# Patient Record
Sex: Female | Born: 1950
Health system: Southern US, Community
[De-identification: ages and names within clinical notes are randomized; demographics above are authoritative.]

## PROBLEM LIST (undated history)

## (undated) DIAGNOSIS — M5136 Other intervertebral disc degeneration, lumbar region: Secondary | ICD-10-CM

## (undated) DIAGNOSIS — M51369 Other intervertebral disc degeneration, lumbar region without mention of lumbar back pain or lower extremity pain: Secondary | ICD-10-CM

## (undated) DIAGNOSIS — K219 Gastro-esophageal reflux disease without esophagitis: Secondary | ICD-10-CM

## (undated) DIAGNOSIS — M1990 Unspecified osteoarthritis, unspecified site: Secondary | ICD-10-CM

## (undated) DIAGNOSIS — R300 Dysuria: Secondary | ICD-10-CM

## (undated) DIAGNOSIS — M419 Scoliosis, unspecified: Secondary | ICD-10-CM

## (undated) DIAGNOSIS — M899 Disorder of bone, unspecified: Secondary | ICD-10-CM

## (undated) DIAGNOSIS — M545 Low back pain, unspecified: Secondary | ICD-10-CM

## (undated) DIAGNOSIS — I10 Essential (primary) hypertension: Secondary | ICD-10-CM

## (undated) DIAGNOSIS — K5190 Ulcerative colitis, unspecified, without complications: Secondary | ICD-10-CM

## (undated) DIAGNOSIS — M949 Disorder of cartilage, unspecified: Secondary | ICD-10-CM

## (undated) HISTORY — DX: Disorder of bone, unspecified: M89.9

## (undated) HISTORY — DX: Low back pain: M54.5

## (undated) HISTORY — DX: Low back pain, unspecified: M54.50

## (undated) HISTORY — PX: CATARACT EXTRACTION, BILATERAL: SHX1313

## (undated) HISTORY — DX: Scoliosis, unspecified: M41.9

## (undated) HISTORY — DX: Unspecified osteoarthritis, unspecified site: M19.90

## (undated) HISTORY — DX: Dysuria: R30.0

## (undated) HISTORY — DX: Ulcerative colitis, unspecified, without complications: K51.90

## (undated) HISTORY — DX: Other intervertebral disc degeneration, lumbar region without mention of lumbar back pain or lower extremity pain: M51.369

## (undated) HISTORY — DX: Gastro-esophageal reflux disease without esophagitis: K21.9

## (undated) HISTORY — PX: CHOLECYSTECTOMY: SHX55

## (undated) HISTORY — DX: Essential (primary) hypertension: I10

## (undated) HISTORY — DX: Other intervertebral disc degeneration, lumbar region: M51.36

## (undated) HISTORY — DX: Disorder of cartilage, unspecified: M94.9

---

## 2006-04-30 ENCOUNTER — Encounter: Payer: Self-pay | Admitting: Family Medicine

## 2006-05-27 ENCOUNTER — Encounter: Payer: Self-pay | Admitting: Family Medicine

## 2006-07-27 HISTORY — PX: LUMBAR DISC SURGERY: SHX700

## 2006-09-16 ENCOUNTER — Encounter: Payer: Self-pay | Admitting: Family Medicine

## 2007-09-28 ENCOUNTER — Encounter: Payer: Self-pay | Admitting: Family Medicine

## 2007-11-24 ENCOUNTER — Encounter: Payer: Self-pay | Admitting: Family Medicine

## 2008-11-15 ENCOUNTER — Encounter: Payer: Self-pay | Admitting: Family Medicine

## 2009-03-26 ENCOUNTER — Encounter: Payer: Self-pay | Admitting: Family Medicine

## 2009-07-11 ENCOUNTER — Encounter: Payer: Self-pay | Admitting: Family Medicine

## 2009-07-12 ENCOUNTER — Encounter: Payer: Self-pay | Admitting: Family Medicine

## 2009-08-06 LAB — HM MAMMOGRAPHY: HM Mammogram: NORMAL

## 2010-01-03 ENCOUNTER — Ambulatory Visit: Payer: Self-pay | Admitting: Family Medicine

## 2010-01-03 DIAGNOSIS — M949 Disorder of cartilage, unspecified: Secondary | ICD-10-CM

## 2010-01-03 DIAGNOSIS — K219 Gastro-esophageal reflux disease without esophagitis: Secondary | ICD-10-CM | POA: Insufficient documentation

## 2010-01-03 DIAGNOSIS — M1990 Unspecified osteoarthritis, unspecified site: Secondary | ICD-10-CM | POA: Insufficient documentation

## 2010-01-03 DIAGNOSIS — M899 Disorder of bone, unspecified: Secondary | ICD-10-CM | POA: Insufficient documentation

## 2010-01-03 DIAGNOSIS — G8929 Other chronic pain: Secondary | ICD-10-CM | POA: Insufficient documentation

## 2010-01-03 DIAGNOSIS — M545 Low back pain, unspecified: Secondary | ICD-10-CM | POA: Insufficient documentation

## 2010-01-03 DIAGNOSIS — K5190 Ulcerative colitis, unspecified, without complications: Secondary | ICD-10-CM | POA: Insufficient documentation

## 2010-01-03 DIAGNOSIS — I10 Essential (primary) hypertension: Secondary | ICD-10-CM | POA: Insufficient documentation

## 2010-01-08 ENCOUNTER — Telehealth: Payer: Self-pay | Admitting: Family Medicine

## 2010-04-29 ENCOUNTER — Ambulatory Visit: Payer: Self-pay | Admitting: Family Medicine

## 2010-06-27 ENCOUNTER — Encounter: Payer: Self-pay | Admitting: Family Medicine

## 2010-06-27 ENCOUNTER — Ambulatory Visit: Payer: Self-pay | Admitting: Family Medicine

## 2010-06-27 DIAGNOSIS — R300 Dysuria: Secondary | ICD-10-CM | POA: Insufficient documentation

## 2010-06-27 LAB — CONVERTED CEMR LAB
Nitrite: NEGATIVE
Protein, U semiquant: NEGATIVE
Urobilinogen, UA: 0.2

## 2010-08-26 NOTE — Assessment & Plan Note (Signed)
Summary: ULCERATIVCULITIS/JRR   Vital Signs:  Patient profile:   60 year old female Height:      66 inches Weight:      186.75 pounds BMI:     30.25 Temp:     97.6 degrees F oral Pulse rate:   68 / minute Pulse rhythm:   regular BP sitting:   110 / 72  (left arm) Cuff size:   regular  Vitals Entered By: Zenda Alpers CMA Deborra Medina) (June 27, 2010 3:43 PM)  History of Present Illness: Chief complaint ulcerative colitis and uti  60year old female pt of Dr. Hulen Shouts with history of ulcerative colitis presents with 1 week long flare of UC.  Blood, mucus and looser stools, low abdominal cramping..... in past she has treated it with mesalamine... but ran out  after taking 5 suppositories. Her symtpms have improved significantly since taking the mesalamine. Has had 2 flares in last year.  Has noted stress triggered.  Stable colonoscopy last year.. in [place].   In last 3 weeks she has had episodes of dysuria, low abd pressure, fever, no change in back pain, urinary frequency and urgency.Marland Kitchen drinks  alot of water and symptoms resolve temporarily. No w symptoms are not present.    Problems Prior to Update: 1)  Ulcerative Colitis  (ICD-556.9) 2)  Gerd  (ICD-530.81) 3)  Hypertension  (ICD-401.9) 4)  Osteopenia  (ICD-733.90) 5)  Osteoarthritis  (ICD-715.90) 6)  Back Pain, Lumbar, Chronic  (ICD-724.2)  Allergies: 1)  ! Codeine 2)  ! Erythromycin  Past History:  Past medical, surgical, family and social histories (including risk factors) reviewed, and no changes noted (except as noted below).  Past Medical History: Reviewed history from 01/03/2010 and no changes required. Osteoarthritis Osteopenia Hypertension GERD Ulcerative Colitis  Past Surgical History: Reviewed history from 01/03/2010 and no changes required. Lumbar Discectomy 2008 Caesarean section x 2  Cholecystectomy  Family History: Reviewed history from 01/03/2010 and no changes required. Dad died of MI at 22,  DM Mom died at 40 of Lung CA  Social History: Reviewed history from 01/03/2010 and no changes required. Retired Never Smoked Alcohol use-no Drug use-no Regular exercise-yes Moved here from [facility] to be closer to grandchildren. Likes to garden  Physical Exam  General:  overweight appearing female in NAD  Mouth:  MMM Lungs:  Normal respiratory effort, chest expands symmetrically. Lungs are clear to auscultation, no crackles or wheezes. Heart:  Normal rate and regular rhythm. S1 and S2 normal without gallop, murmur, click, rub or other extra sounds. Abdomen:  mild left lower quadrant pain no rebound, no guarding, NABS  no CVA tenderness   Impression & Recommendations:  Problem # 1:  ULCERATIVE COLITIS (ICD-556.9) Flare... refilled canasa.. mesalamine suppositories.  Call if symptoms not improving as expected.   Problem # 2:  DYSURIA (ICD-788.1) No clear UTI... symptoms consistent.. urine contaminated. Send urine for culture. Push fluids. Avoid bladder irritants.   Complete Medication List: 1)  Metoprolol Tartrate 25 Mg Tabs (Metoprolol tartrate) .... One half pill by mouth twice daily 2)  Meloxicam 15 Mg Tabs (Meloxicam) .... Take one table by mouth daily 3)  Zyrtec Allergy 10 Mg Tabs (Cetirizine hcl) .... Take one tablet by mouth daily 4)  Omeprazole 40 Mg Cpdr (Omeprazole) .... Take one tablet by mouth daily 5)  Calcium 1200-1000 Mg-unit Chew (Calcium carbonate-vit d-min) .... Take one tablet by mouth daily 6)  Cranberry Plus Vitamin C 140-100-3 Mg-mg-unit Caps (Cranberry-vitamin c-vitamin e) .... Take one tablet daily  1657m 7)  Fluticasone Propionate 50 Mcg/act Susp (Fluticasone propionate) .... Use as directed 8)  Dulcolax Stool Softener 100 Mg Caps (Docusate sodium) .... Take one tablet by mouth daily 9)  Canasa 1000 Mg Supp (Mesalamine) ..Marland Kitchen. 1 pr at bedtime prn  ulcerative colitis symptoms  Other Orders: UA Dipstick W/ Micro (manual) (81000) T-Culture, Urine  (([id number]  Patient Instructions: 1)  We will call you with results of urine culture. 2)   If symptoms of UC not improving with mesalamine please call for local GI referral.  Prescriptions: CANASA 1000 MG SUPP (MESALAMINE) 1 pr at bedtime prn  ulcerative colitis symptoms  #30 x 0   Entered and Authorized by:   AEliezer LoftsMD   Signed by:   AEliezer LoftsMD on 06/27/2010   Method used:   [facility]        [facility]  [address] [facility]       [address]  [address]      Ph: [phone] [phone]      Fax: [phone]  RxID:   [id number]   Orders Added: 1)  Est. Patient Level IV [[02585]2)  UA Dipstick W/ Micro (manual) [81000] 3)  T-Culture, Urine [[[id number]]   Current Allergies (reviewed today): ! CODEINE ! ERYTHROMYCIN   Laboratory Results   Urine Tests  Date/Time Received: June 27, 2010 3:51 PM  Date/Time Reported: June 27, 2010 3:51 PM   Routine Urinalysis   Color: yellow Appearance: Clear Glucose: negative   (Normal Range: Negative) Bilirubin: negative   (Normal Range: Negative) Ketone: negative   (Normal Range: Negative) Spec. Gravity: 1.015   (Normal Range: 1.003-1.035) Blood: trace-lysed   (Normal Range: Negative) pH: 5.0   (Normal Range: 5.0-8.0) Protein: negative   (Normal Range: Negative) Urobilinogen: 0.2   (Normal Range: 0-1) Nitrite: negative   (Normal Range: Negative) Leukocyte Esterace: small   (Normal Range: Negative)

## 2010-08-26 NOTE — Progress Notes (Signed)
Summary: Zostavax wait list  Phone Note Outgoing Call Call back at Home Phone [phone]   Call placed by: DeShannon Smith CMA Deborra Medina),  January 08, 2010 2:52 PM Call placed to: Patient Summary of Call: calling pt to see if she still wanted to get the Zostavax vaccine, her name was on the waiting list. Initial call taken by: Edwin Dada CMA Deborra Medina),  January 08, 2010 2:52 PM  Follow-up for Phone Call        [facility] with patient to advise that we've recently got Zostavax in and she can schedule a nurse visit to have it done. Follow-up by: Edwin Dada CMA Deborra Medina),  January 08, 2010 2:53 PM

## 2010-08-26 NOTE — Procedures (Signed)
Summary: [facility]  [facility]   Imported By: Edmonia James 01/14/2010 13:50:08  _____________________________________________________________________  External Attachment:    Type:   Image     Comment:   External Document

## 2010-08-26 NOTE — Assessment & Plan Note (Signed)
Summary: NEW PT TO ESTABH/DLO   Vital Signs:  Patient profile:   60 year old female Height:      66 inches Weight:      176.50 pounds BMI:     28.59 Temp:     97.8 degrees F oral Pulse rate:   68 / minute Pulse rhythm:   regular BP sitting:   122 / 80  (left arm) Cuff size:   regular  Vitals Entered By: Sherrian Divers CMA Deborra Medina) 2010/01/25 10:55 AM) CC: new patient   History of Present Illness: 60 yo female here to establish care.  Ulcerative colitis- distal disease (proctitis)- diagnosed in her late 18s.  Has a flare every 2-3 years, aggrevated by stress.  Abdominal cramping, bloody diarrhea.  Usually takes Asacol suppositories, does not require prednisone. Not taking anything for prophylaxis.  Last colonoscopy was normal last year. No fevers, chills or weight loss. Last flare in February.  HTN-  has been stable on Metoprolol 12. 5 mg two times a day since she started it in December.  Awaiting records.  OA- mainly in her hands, thumb.  Taking Meloxicam 15 mg daily which has really helped with pain.  GERD- quite severe, has had two EGDs in past 3 years.  Per pt, normal endoscopies.  Symptoms greatly improved with Omeprazole 40 mg daily.  Chronic low back pain- s/p laminectomy.  Sometimes has residual radiculopathy, not severe. Used to have massage therapy once monthly, looking for something locally.  Well woman- UTD on most prevention.  I would like for her to have repeat DEXA given h/o osteopenia and PPI use.  She would like for [place] to wait for records.  Preventive Screening-Counseling & Management  Alcohol-Tobacco     Smoking Status: never  Caffeine-Diet-Exercise     Does Patient Exercise: yes      Drug Use:  no.    Current Medications (verified): 1)  Metoprolol Tartrate 25 Mg Tabs (Metoprolol Tartrate) .... One Half Pill By Mouth Twice Daily 2)  Meloxicam 15 Mg Tabs (Meloxicam) .... Take One Table By Mouth Daily 3)  Zyrtec Allergy 10 Mg Tabs (Cetirizine Hcl)  .... Take One Tablet By Mouth Daily 4)  Omeprazole 40 Mg Cpdr (Omeprazole) .... Take One Tablet By Mouth Daily 5)  Calcium 1200-1000 Mg-Unit Chew (Calcium Carbonate-Vit D-Min) .... Take One Tablet By Mouth Daily 6)  Cranberry Plus Vitamin C 140-100-3 Mg-Mg-Unit Caps (Cranberry-Vitamin C-Vitamin E) .... Take One Tablet Daily 1668m 7)  Fluticasone Propionate 50 Mcg/act Susp (Fluticasone Propionate) .... Use As Directed 8)  Dulcolax Stool Softener 100 Mg Caps (Docusate Sodium) .... Take One Tablet By Mouth Daily  Allergies (verified): 1)  ! Codeine 2)  ! Erythromycin  Past History:  Family History: Last updated: 0Jul 02, 2011Dad died of MI at 793 DM Mom died at 87of Lung CA  Social History: Last updated: 02011-07-02Retired Never Smoked Alcohol use-no Drug use-no Regular exercise-yes [place] [place] [facility] [place] Likes to garden  Risk Factors: Exercise: yes (007-08-2009  Risk Factors: Smoking Status: never (02011-07-02  Past Medical History: Osteoarthritis Osteopenia Hypertension GERD Ulcerative Colitis  Past Surgical History: Lumbar Discectomy 2008 Caesarean section x 2  Cholecystectomy  Family History: Reviewed history and no changes required. Dad died of MI at 736 DM Mom died at 834of Lung CA  Social History: Reviewed history and no changes required. Retired Never Smoked Alcohol use-no Drug use-no Regular exercise-yes [place] [facility] [facility]. Likes to garden Smoking Status:  never Drug Use:  no Does Patient Exercise:  yes  Review of Systems      See HPI General:  Denies chills, fatigue, and fever. Eyes:  Denies blurring. ENT:  Denies decreased hearing and difficulty swallowing. CV:  Denies chest pain or discomfort and difficulty breathing at night. Resp:  Denies shortness of breath. GI:  Denies abdominal pain. GU:  Denies abnormal vaginal bleeding, incontinence, and urinary frequency. MS:  Complains  of joint pain and joint swelling; denies joint redness and muscle weakness. Derm:  Denies rash. Neuro:  Denies visual disturbances and weakness. Psych:  Denies anxiety and depression. Endo:  Denies cold intolerance. Heme:  Denies abnormal bruising.  Physical Exam  General:  alert, well-developed, and well-nourished.   Head:  normocephalic.   Eyes:  vision grossly intact, pupils equal, pupils round, and pupils reactive to light.   Ears:  R ear normal and L ear normal.   Nose:  no external deformity.   Mouth:  good dentition and no gingival abnormalities.   Lungs:  Normal respiratory effort, chest expands symmetrically. Lungs are clear to auscultation, no crackles or wheezes. Heart:  Normal rate and regular rhythm. S1 and S2 normal without gallop, murmur, click, rub or other extra sounds. Abdomen:  Bowel sounds positive,abdomen soft and non-tender without masses, organomegaly or hernias noted. Extremities:  No clubbing, cyanosis, edema, or deformity noted with normal full range of motion of all joints.   Neurologic:  alert & oriented X3 and cranial nerves II-XII intact.   Skin:  Intact without suspicious lesions or rashes Psych:  Cognition and judgment appear intact. Alert and cooperative with normal attention span and concentration. No apparent delusions, illusions, hallucinations   Impression & Recommendations:  Problem # 1:  GERD (ICD-530.81) Assessment Unchanged Continue Omeprazole 40 mg.  Discussed risks and benefits. Her updated medication list for this problem includes:    Omeprazole 40 Mg Cpdr (Omeprazole) .Marland Kitchen... Take one tablet by mouth daily  Problem # 2:  ULCERATIVE COLITIS (ICD-556.9) Assessment: Unchanged Currently stable.  Continue as needed meds.  Problem # 3:  HYPERTENSION (ICD-401.9) Assessment: Unchanged STable.  Continue Metoprolol at current dose. Her updated medication list for this problem includes:    Metoprolol Tartrate 25 Mg Tabs (Metoprolol tartrate)  ..... One half pill by mouth twice daily  Problem # 4:  OSTEOARTHRITIS (ICD-715.90) Assessment: Unchanged Refilled Meloxicam. Her updated medication list for this problem includes:    Meloxicam 15 Mg Tabs (Meloxicam) .Marland Kitchen... Take one table by mouth daily  Problem # 5:  BACK PAIN, LUMBAR, CHRONIC (ICD-724.2) Assessment: Unchanged Given referral for Massage Therapy weekly. Her updated medication list for this problem includes:    Meloxicam 15 Mg Tabs (Meloxicam) .Marland Kitchen... Take one table by mouth daily  Orders: Misc. Referral (Misc. Ref)  Complete Medication List: 1)  Metoprolol Tartrate 25 Mg Tabs (Metoprolol tartrate) .... One half pill by mouth twice daily 2)  Meloxicam 15 Mg Tabs (Meloxicam) .... Take one table by mouth daily 3)  Zyrtec Allergy 10 Mg Tabs (Cetirizine hcl) .... Take one tablet by mouth daily 4)  Omeprazole 40 Mg Cpdr (Omeprazole) .... Take one tablet by mouth daily 5)  Calcium 1200-1000 Mg-unit Chew (Calcium carbonate-vit d-min) .... Take one tablet by mouth daily 6)  Cranberry Plus Vitamin C 140-100-3 Mg-mg-unit Caps (Cranberry-vitamin c-vitamin e) .... Take one tablet daily 1640m 7)  Fluticasone Propionate 50 Mcg/act Susp (Fluticasone propionate) .... Use as directed 8)  Dulcolax Stool  Softener 100 Mg Caps (Docusate sodium) .... Take one tablet by mouth daily  Patient Instructions: 1)  Great to meet you, Ms. Jaquez. 2)  Please stop by to see Rosaria Ferries on your way out. 3)  We will call you when the shingles vaccine comes in. 4)  When we get your records, I will also let you know. Prescriptions: METOPROLOL TARTRATE 25 MG TABS (METOPROLOL TARTRATE) one half pill by mouth twice daily  #180 x 6   Entered and Authorized by:   Arnette Norris MD   Signed by:   Arnette Norris MD on 01/03/2010   Method used:   [facility]        [facility]  [address] [place] (retail[facility]       [place], [place]  [address]       Ph: [phone] or [phone]       Fax:  [phone]   RxID:   [id number] MELOXICAM 15 MG TABS (MELOXICAM) take one table by mouth daily  #90 x 6   Entered and Authorized by:   Arnette Norris MD   Signed by:   Arnette Norris MD on 01/03/2010   Method used:   [facility]        [facility]  [address] ([facility]ail[facility]       [place], [place]  [address]       Ph: [phone] or [phone]       Fax: [phone]   RxID:   [id number]   Current Allergies (reviewed today): ! CODEINE ! ERYTHROMYCIN  Flex Sig Next Due:  Not Indicated Colonoscopy Result Date:  08/07/2008 Colonoscopy Result:  normal Hemoccult Next Due:  Not Indicated PAP Result Date:  08/11/2008 PAP Result:  normal PAP Next Due:  2 yr Mammogram Result Date:  08/06/2009 Mammogram Result:  normal Mammogram Next Due:  1 yr

## 2010-08-26 NOTE — Procedures (Signed)
Summary: [facility]  [facility]   Imported By: Edmonia James 01/14/2010 13:48:43  _____________________________________________________________________  External Attachment:    Type:   Image     Comment:   External Document

## 2010-08-26 NOTE — Assessment & Plan Note (Signed)
Summary: FLU SHOT/CLE  Nurse Visit   Allergies: 1)  ! Codeine 2)  ! Erythromycin  Orders Added: 1)  Admin 1st Vaccine [38756] 2)  Flu Vaccine 64yr + [[43329] Flu Vaccine Consent Questions     Do you have a history of severe allergic reactions to this vaccine? no    Any prior history of allergic reactions to egg and/or gelatin? no    Do you have a sensitivity to the preservative Thimersol? no    Do you have a past history of Guillan-Barre Syndrome? no    Do you currently have an acute febrile illness? no    Have you ever had a severe reaction to latex? no    Vaccine information given and explained to patient? yes    Are you currently pregnant? no    Lot Number:[id number]   Exp Date:01/24/2011   Site Given  Left Deltoid IMu

## 2010-08-27 ENCOUNTER — Encounter: Payer: Self-pay | Admitting: Family Medicine

## 2010-08-27 ENCOUNTER — Ambulatory Visit (INDEPENDENT_AMBULATORY_CARE_PROVIDER_SITE_OTHER): Payer: BC Managed Care – PPO | Admitting: Family Medicine

## 2010-08-27 DIAGNOSIS — K5190 Ulcerative colitis, unspecified, without complications: Secondary | ICD-10-CM

## 2010-08-27 DIAGNOSIS — R300 Dysuria: Secondary | ICD-10-CM

## 2010-08-27 LAB — CONVERTED CEMR LAB
Bilirubin Urine: NEGATIVE
Glucose, Urine, Semiquant: NEGATIVE
Ketones, urine, test strip: NEGATIVE
Nitrite: NEGATIVE
Specific Gravity, Urine: <1.005
pH: 6

## 2010-08-28 ENCOUNTER — Encounter: Payer: Self-pay | Admitting: Family Medicine

## 2010-08-29 ENCOUNTER — Telehealth: Payer: Self-pay | Admitting: Family Medicine

## 2010-09-03 NOTE — Assessment & Plan Note (Signed)
Summary: colitis is acting up and may be getting a uti jrt   Vital Signs:  Patient profile:   60 year old female Height:      66 inches Weight:      185.75 pounds BMI:     30.09 Temp:     98.7 degrees F oral Pulse rate:   62 / minute Pulse rhythm:   regular BP sitting:   122 / 88  (left arm) Cuff size:   regular  Vitals Entered By: Sherrian Divers CMA Deborra Medina) (August 27, 2010 12:32 PM) CC: ? UTI, colitis   History of Present Illness: Chief complaint ulcerative colitis and ?uti  60year old  with history of ulcerative colitis presents with 1 week long flare of UC.  Blood, mucus and looser stools, low abdominal cramping, treated in past she has treated it with mesalamine and hydrocortisone suppository  but ran out  after taking a few suppositories. Her symtpms have improved significantly since taking the mesalamine but still having some cramping and bloody stools.    Has had 2 flares in past 3 months.  Has noted stress triggered. Feels that she has IBS as well.    Last flare, had a UTI as well, pan sensitive E. Coli.  Past few days, noticed some increased urinary frequency and dysuria.  No back pain, fever, or chills.    Current Medications (verified): 1)  Metoprolol Tartrate 25 Mg Tabs (Metoprolol Tartrate) .... One Half Pill By Mouth Twice Daily 2)  Meloxicam 15 Mg Tabs (Meloxicam) .... Take One Table By Mouth Daily 3)  Zyrtec Allergy 10 Mg Tabs (Cetirizine Hcl) .... Take One Tablet By Mouth Daily 4)  Omeprazole 40 Mg Cpdr (Omeprazole) .... Take One Tablet By Mouth Daily 5)  Calcium 1200-1000 Mg-Unit Chew (Calcium Carbonate-Vit D-Min) .... Take One Tablet By Mouth Daily 6)  Cranberry Plus Vitamin C 140-100-3 Mg-Mg-Unit Caps (Cranberry-Vitamin C-Vitamin E) .... Take One Tablet Daily 1655m 7)  Fluticasone Propionate 50 Mcg/act Susp (Fluticasone Propionate) .... Use As Directed 8)  Dulcolax Stool Softener 100 Mg Caps (Docusate Sodium) .... Take One Tablet By Mouth Daily 9)   Canasa 1000 Mg Supp (Mesalamine) ..Marland Kitchen. 1 Pr At Bedtime Prn  Ulcerative Colitis Symptoms 10)  Amoxicillin 500 Mg Caps (Amoxicillin) .... Take One Tablet Twice A Day For 7 Days 11)  Hydrocortisone Acetate 25 Mg Supp (Hydrocortisone Acetate) ..Marland Kitchen. 1 Supp Pr Two Times A Day As Needed. 12)  Bactrim Ds 800-160 Mg Tabs (Sulfamethoxazole-Trimethoprim) ..Marland Kitchen. 1 Tab By Mouth Two Times A Day X 3 Days  Allergies: 1)  ! Codeine 2)  ! Erythromycin  Past History:  Past Medical History: Last updated: 006-16-2011Osteoarthritis Osteopenia Hypertension GERD Ulcerative Colitis  Past Surgical History: Last updated: 006-16-2011Lumbar Discectomy 2008 Caesarean section x 2  Cholecystectomy  Family History: Last updated: 016-Jun-2011Dad died of MI at 718 DM Mom died at 81of Lung CA  Social History: Last updated: 016-Jun-2011Retired Never Smoked Alcohol use-no Drug use-no Regular exercise-yes [place] [facility] [facility] [place] [facility] [place] Likes to garden  Risk Factors: Exercise: yes (02011-06-16  Risk Factors: Smoking Status: never (006/16/2011  Review of Systems      See HPI General:  Denies chills and fever. GI:  Complains of abdominal pain, bloody stools, and diarrhea. GU:  Complains of dysuria and urinary frequency.  Physical Exam  General:  overweight appearing female in NAD  Mouth:  MMM Abdomen:  mild left lower quadrant pain no rebound, no  guarding, NABS  no CVA tenderness Psych:  Cognition and judgment appear intact. Alert and cooperative with normal attention span and concentration. No apparent delusions, illusions, hallucinations   Impression & Recommendations:  Problem # 1:  ULCERATIVE COLITIS (ICD-556.9) Assessment Deteriorated Will try refilling her hydrocortisone supporitories.  If falre persistes, will add short course of by mouth prednisone. Pt in agreement with plan.  Problem # 2:  DYSURIA (ICD-788.1) Assessment: New UA looks relatively clean, but given her  history, will treat with 3 day course of bactrim and send for culture. Her updated medication list for this problem includes:    Amoxicillin 500 Mg Caps (Amoxicillin) .Marland Kitchen... Take one tablet twice a day for 7 days    Bactrim Ds 800-160 Mg Tabs (Sulfamethoxazole-trimethoprim) .Marland Kitchen... 1 tab by mouth two times a day x 3 days  Orders: UA Dipstick w/o Micro (manual) (81002) T-Culture, Urine ([id number]) Specimen Handling (99000)  Complete Medication List: 1)  Metoprolol Tartrate 25 Mg Tabs (Metoprolol tartrate) .... One half pill by mouth twice daily 2)  Meloxicam 15 Mg Tabs (Meloxicam) .... Take one table by mouth daily 3)  Zyrtec Allergy 10 Mg Tabs (Cetirizine hcl) .... Take one tablet by mouth daily 4)  Omeprazole 40 Mg Cpdr (Omeprazole) .... Take one tablet by mouth daily 5)  Calcium 1200-1000 Mg-unit Chew (Calcium carbonate-vit d-min) .... Take one tablet by mouth daily 6)  Cranberry Plus Vitamin C 140-100-3 Mg-mg-unit Caps (Cranberry-vitamin c-vitamin e) .... Take one tablet daily 1628m 7)  Fluticasone Propionate 50 Mcg/act Susp (Fluticasone propionate) .... Use as directed 8)  Dulcolax Stool Softener 100 Mg Caps (Docusate sodium) .... Take one tablet by mouth daily 9)  Canasa 1000 Mg Supp (Mesalamine) ..Marland Kitchen. 1 pr at bedtime prn  ulcerative colitis symptoms 10)  Amoxicillin 500 Mg Caps (Amoxicillin) .... Take one tablet twice a day for 7 days 11)  Hydrocortisone Acetate 25 Mg Supp (Hydrocortisone acetate) ..Marland Kitchen. 1 supp pr two times a day as needed. 12)  Bactrim Ds 800-160 Mg Tabs (Sulfamethoxazole-trimethoprim) ..Marland Kitchen. 1 tab by mouth two times a day x 3 days Prescriptions: FLUTICASONE PROPIONATE 50 MCG/ACT SUSP (FLUTICASONE PROPIONATE) use as directed  #1 x 1   Entered and Authorized by:   TArnette NorrisMD   Signed by:   TArnette NorrisMD on 08/27/2010   Method used:   [facility]        [facility]  [address]       [address] [place]  [address]      Ph:  [phone] [phone]      Fax: [phone]  RxID:   [id number] DS 800-160 MG TABS (SULFAMETHOXAZOLE-TRIMETHOPRIM) 1 tab by mouth two times a day x 3 days  #6 x 0   Entered and Authorized by:   TArnette NorrisMD   Signed by:   TArnette NorrisMD on 08/27/2010   Method used:   [facility]        [facility]  [address] (retail)       [address]  [address]      Ph: [phone] [phone]      Fax: [phone]  RxID:   [id number] ACETATE 25 MG SUPP (HYDROCORTISONE ACETATE) 1 supp PR two times a day as needed.  #60 x 3   Entered and Authorized by:   TArnette NorrisMD  Signed by:   Arnette Norris MD on 08/27/2010   Method used:   [facility] to        [facility]  [address] [place] (retail)       [place], [place]  [address]       Ph: [phone] or [phone]       Fax: [phone]   RxID:   [id number]    Orders Added: 1)  UA Dipstick w/o Micro (manual) [81002] 2)  T-Culture, Urine [[id number]] 3)  Specimen Handling [99000] 4)  Est. Patient Level IV [44715]    Current Allergies (reviewed today): ! CODEINE ! ERYTHROMYCIN  Laboratory Results   Urine Tests  Date/Time Received: August 27, 2010 12:45 PM   Routine Urinalysis   Color: yellow Appearance: Clear Glucose: negative   (Normal Range: Negative) Bilirubin: negative   (Normal Range: Negative) Ketone: negative   (Normal Range: Negative) Spec. Gravity: <1.005   (Normal Range: 1.003-1.035) Blood: trace-intact   (Normal Range: Negative) pH: 6.0   (Normal Range: 5.0-8.0) Protein: negative   (Normal Range: Negative) Urobilinogen: 0.2   (Normal Range: 0-1) Nitrite: negative   (Normal Range: Negative) Leukocyte Esterace: negative   (Normal Range: Negative)

## 2010-09-03 NOTE — Progress Notes (Signed)
Summary: not any better  Phone Note Call from Patient Call back at Home Phone [phone]   Caller: Patient Call For: Brittney Pierce Summary of Call: Pt was seen for colitis and was told to call if not better today.  She says she is not and is asking if something can be called to [place], she says you had told her that something else would be called in. Initial call taken by: Marty Heck CMA, AAMA,  August 29, 2010 3:52 PM  Follow-up for Phone Call        yes, lets do a steroid taper.  I will send it in. Brittney Pierce  August 29, 2010 3:55 PM  Advised pt.  Follow-up by: Marty Heck CMA, AAMA,  August 29, 2010 4:09 PM    New/Updated Medications: PREDNISONE 20 MG TABS (PREDNISONE) 3 tabs by mouth x 3 days, 2 tabs by mouth x 3 days, 1 tab by mouth x 3 days and stop. dispense qs Prescriptions: PREDNISONE 20 MG TABS (PREDNISONE) 3 tabs by mouth x 3 days, 2 tabs by mouth x 3 days, 1 tab by mouth x 3 days and stop. dispense qs  #1 x 0   Entered and Authorized by:   Brittney Pierce   Signed by:   Brittney Pierce on 08/29/2010   Method used:   [facility]       [address]       [place], [place]  [address]       Ph: [phone] or [phone]       Fax: [phone]   RxID:   [id number]

## 2010-11-04 ENCOUNTER — Ambulatory Visit (INDEPENDENT_AMBULATORY_CARE_PROVIDER_SITE_OTHER): Payer: BC Managed Care – PPO | Admitting: Family Medicine

## 2010-11-04 ENCOUNTER — Encounter: Payer: Self-pay | Admitting: Family Medicine

## 2010-11-04 VITALS — BP 136/80 | HR 76 | Temp 97.7°F | Wt 190.0 lb

## 2010-11-04 DIAGNOSIS — M549 Dorsalgia, unspecified: Secondary | ICD-10-CM

## 2010-11-04 DIAGNOSIS — R300 Dysuria: Secondary | ICD-10-CM

## 2010-11-04 LAB — POCT URINALYSIS DIPSTICK
Bilirubin, UA: NEGATIVE
Glucose, UA: NEGATIVE
Ketones, UA: NEGATIVE
Protein, UA: NEGATIVE
Spec Grav, UA: 1.005

## 2010-11-04 MED ORDER — CIPROFLOXACIN HCL 500 MG PO TABS: 500.0000 mg | ORAL_TABLET | Freq: Two times a day (BID) | ORAL | Status: AC

## 2010-11-04 NOTE — Assessment & Plan Note (Signed)
UA suspicious and story consistent with UTI.  Given flank pain, cover with 54m cipro x 5 days.  Pt states has tolerated well in past. Advised to update uKoreaif not improved after this regimen. UCx sent.

## 2010-11-04 NOTE — Progress Notes (Signed)
  Subjective:    Patient ID: Brittney Pierce, female    DOB: [birth year], 60 y.o.   MRN: [id number]  HPI CC: ? UTI  Several day h/o dysuria, frequency, mild urgency.  Started about 1 1/2 wks ago.  Water helped.  Saturday and again yesterday came back.  Last night with burning lower back pain.  Has had in past, feels same.  No fevers/chills, nausea/vomiting, abd pain.  H/o ulcerative colitis flare, last treated with bactrim 08/2010.  Review of Systems Per HPI    Objective:   Physical Exam  Vitals reviewed. Constitutional: She appears well-developed and well-nourished. No distress.  HENT:  Head: Normocephalic and atraumatic.  Mouth/Throat: Oropharynx is clear and moist. No oropharyngeal exudate.  Eyes: Pupils are equal, round, and reactive to light.  Abdominal: Soft. Bowel sounds are normal. She exhibits no distension and no mass. There is no tenderness. There is CVA tenderness (mild L CVA tenderness). There is no rebound and no guarding.          Assessment & Plan:

## 2010-11-04 NOTE — Patient Instructions (Signed)
Looks like infection.  Take Cipro 573m twice daily for 5 days.  If not improved with this therapy, let uKoreaknow.  Urine culture sent today. Push fluids (cranberry juice and water) and rest.

## 2010-11-07 ENCOUNTER — Ambulatory Visit (INDEPENDENT_AMBULATORY_CARE_PROVIDER_SITE_OTHER): Payer: BC Managed Care – PPO | Admitting: Family Medicine

## 2010-11-07 VITALS — BP 122/84 | HR 72 | Temp 98.7°F | Ht 66.0 in | Wt 188.4 lb

## 2010-11-07 DIAGNOSIS — N63 Unspecified lump in unspecified breast: Secondary | ICD-10-CM

## 2010-11-07 DIAGNOSIS — N6320 Unspecified lump in the left breast, unspecified quadrant: Secondary | ICD-10-CM | POA: Insufficient documentation

## 2010-11-07 LAB — URINE CULTURE: Colony Count: 10000

## 2010-11-07 NOTE — Assessment & Plan Note (Signed)
New. Likely benign fibrous tissue. Refer for diagnostic mammogram to evaluate further.

## 2010-11-07 NOTE — Patient Instructions (Signed)
Sitz Bath A sitz bath is a warm water bath taken in the sitting position that covers only the hips and buttocks. It may be used for either healing or hygiene purposes. The water may contain medication. Sitz baths are often used to relieve pain, itching or muscle spasms. Moist heat will help you heal and relax. Follow these steps carefully: [place] in the water for 15 to 20 minutes.   After the sitz bath, blot dry the affected part first.   Take 3 to 4 sitz baths a day.  SEEK MEDICAL CARE IF: You get worse instead of better; stop the sitz baths. Document Released: 04/04/2004 Document Re-Released: 08/04/2009 [facility] Patient Information 2011 [place].  Please see Rosaria Ferries on your way out.

## 2010-11-07 NOTE — Progress Notes (Signed)
60 yo here for left breast mass. Has always had fibrous breast tissue. Overdue for screening mammogram, last done in 2009.  Noticed this mass at [facility] position a few months ago. Sometimes a little painful to palpation. No fevers, chills or redness over mass. No changes in her nipples or nipple discharge.  The PMH, PSH, Social History, Family History, Medications, and allergies have been reviewed in [facility], and have been updated if relevant.  ROS: See HPI General: Denies fever, chills, sweats. No significant weight loss. Heme: Denies enlarged lymph nodes  Physical exam:  BP 122/84  Pulse 72  Temp(Src) 98.7 F (37.1 C) (Oral)  Ht 5' 6"  (1.676 m)  Wt 188 lb 6.4 oz (85.458 kg)  BMI 30.41 kg/m2 Gen:  Alert, pleasant female, NAD. HEENT: MMM Breast exam:right breast: Breast exam: No mass, nodules, thickening, tenderness, bulging, retraction, inflamation, nipple discharge or skin changes noted.  No axillary or clavicular LA.   Left breast: small, firm freely movable lump 9 oclock position left breast.

## 2010-11-14 ENCOUNTER — Encounter: Payer: Self-pay | Admitting: *Deleted

## 2010-11-14 ENCOUNTER — Ambulatory Visit
Admission: RE | Admit: 2010-11-14 | Discharge: 2010-11-14 | Disposition: A | Discharge status: Home / Self Care | Payer: BC Managed Care – PPO | Source: Ambulatory Visit | Attending: Family Medicine | Admitting: Family Medicine

## 2010-11-14 DIAGNOSIS — N6320 Unspecified lump in the left breast, unspecified quadrant: Secondary | ICD-10-CM

## 2010-12-30 ENCOUNTER — Encounter: Payer: Self-pay | Admitting: Family Medicine

## 2010-12-30 ENCOUNTER — Ambulatory Visit (INDEPENDENT_AMBULATORY_CARE_PROVIDER_SITE_OTHER): Payer: BC Managed Care – PPO | Admitting: Family Medicine

## 2010-12-30 VITALS — BP 120/80 | HR 68 | Temp 97.9°F | Wt 189.5 lb

## 2010-12-30 DIAGNOSIS — M545 Low back pain, unspecified: Secondary | ICD-10-CM

## 2010-12-30 DIAGNOSIS — N390 Urinary tract infection, site not specified: Secondary | ICD-10-CM

## 2010-12-30 LAB — POCT URINALYSIS DIPSTICK
Ketones, UA: NEGATIVE
Leukocytes, UA: NEGATIVE
Protein, UA: NEGATIVE
Urobilinogen, UA: 0.2
pH, UA: 6

## 2010-12-30 MED ORDER — SULFAMETHOXAZOLE-TRIMETHOPRIM 800-160 MG PO TABS: 1.0000 | ORAL_TABLET | Freq: Two times a day (BID) | ORAL | Status: AC

## 2010-12-30 NOTE — Patient Instructions (Signed)
Drink plenty of fluids, start the antibiotics and we'll contact you with your lab report.  Take care.  Let us know if you don't improve.

## 2010-12-30 NOTE — Progress Notes (Signed)
Dysuria: yes, started about 10 days ago. Sx resolved with inc in po fluids.  Return in burning yesterday, and now with some B lower back pain.  This is typical for UTI sx for the patient.   duration of symptoms: as above abdominal pain: minimal in LLQ, this is frequent with colitis fevers:no back pain: as above vomiting:no other concerns: some fatigue over last few days.    Meds, vitals, and allergies reviewed.   ROS: See HPI.  Otherwise negative.    GEN: nad, alert and oriented HEENT: mucous membranes moist NECK: supple CV: rrr.  PULM: ctab, no inc wob ABD: soft, +bs, suprapubic area not tender EXT: well perfused SKIN: no acute rash BACK: no CVA pain

## 2010-12-31 ENCOUNTER — Other Ambulatory Visit: Payer: Self-pay | Admitting: *Deleted

## 2010-12-31 ENCOUNTER — Encounter: Payer: Self-pay | Admitting: Family Medicine

## 2010-12-31 DIAGNOSIS — N390 Urinary tract infection, site not specified: Secondary | ICD-10-CM | POA: Insufficient documentation

## 2010-12-31 MED ORDER — OMEPRAZOLE 40 MG PO CPDR: 40.0000 mg | DELAYED_RELEASE_CAPSULE | Freq: Every day | ORAL | Status: DC

## 2010-12-31 NOTE — Assessment & Plan Note (Signed)
Start abx, continue inc in po fluids, check cx and pt to call back or f/u if not improved.  She agrees.  Okay for outpatient fu.

## 2011-01-01 LAB — URINE CULTURE: Colony Count: NO GROWTH

## 2011-01-15 ENCOUNTER — Ambulatory Visit (INDEPENDENT_AMBULATORY_CARE_PROVIDER_SITE_OTHER): Payer: BC Managed Care – PPO | Admitting: Family Medicine

## 2011-01-15 ENCOUNTER — Encounter: Payer: Self-pay | Admitting: Family Medicine

## 2011-01-15 VITALS — BP 136/90 | HR 73 | Temp 97.8°F | Wt 189.0 lb

## 2011-01-15 DIAGNOSIS — R300 Dysuria: Secondary | ICD-10-CM

## 2011-01-15 LAB — POCT URINALYSIS DIPSTICK
Nitrite, UA: NEGATIVE
pH, UA: 5

## 2011-01-15 MED ORDER — CIPROFLOXACIN HCL 500 MG PO TABS: ORAL_TABLET | ORAL | Status: DC

## 2011-01-15 NOTE — Progress Notes (Signed)
60 yo here for dysuria.  Saw Dr. Damita Dunnings on 12/30/2010 for similar symptoms, treated with Bactrim. Urine culture showed no growth, she finished bactrim. Symptoms have actually gotten worse.  More supra pubic pressure and dysuria.  fevers:no back pain: no vomiting:no    Patient Active Problem List  Diagnoses  . HYPERTENSION  . GERD  . ULCERATIVE COLITIS  . OSTEOARTHRITIS  . BACK PAIN, LUMBAR, CHRONIC  . OSTEOPENIA  . Dysuria  . Breast mass, left  . UTI (lower urinary tract infection)   Past Medical History  Diagnosis Date  . Lumbago   . Dysuria   . Esophageal reflux   . Unspecified essential hypertension   . Osteoarthrosis, unspecified whether generalized or localized, unspecified site   . Disorder of bone and cartilage, unspecified   . Ulcerative colitis, unspecified    Past Surgical History  Procedure Date  . Lumbar disc surgery 2008  . Cesarean section     x2  . Cholecystectomy    History  Substance Use Topics  . Smoking status: Former Smoker    Quit date: 07/27/1980  . Smokeless tobacco: Never Used  . Alcohol Use: Yes     Once a month (maybe)   Family History  Problem Relation Age of Onset  . Heart attack Father 13  . Diabetes Father   . Lung cancer Mother    Allergies  Allergen Reactions  . Codeine     REACTION: stomach upset  . Erythromycin     REACTION: stomach upset   Current Outpatient Prescriptions on File Prior to Visit  Medication Sig Dispense Refill  . Calcium 1200-1000 MG-UNIT CHEW Chew 1 tablet by mouth daily.        . cetirizine (ZYRTEC) 10 MG tablet Take 10 mg by mouth daily.        . Cranberry-Vitamin C-Vitamin E (CRANBERRY CONCENTRATE) 140-100-3 MG-MG-UNIT CAPS Take 1 tablet by mouth daily.        Marland Kitchen docusate sodium (COLACE) 100 MG capsule Take 100 mg by mouth daily.        . fluticasone (FLONASE) 50 MCG/ACT nasal spray 2 sprays by Nasal route as directed.        . hydrocortisone (ANUSOL-HC) 25 MG suppository Place 25 mg rectally 2  (two) times daily as needed.        . meloxicam (MOBIC) 15 MG tablet Take 15 mg by mouth daily.        . metoprolol tartrate (LOPRESSOR) 25 MG tablet Take 12.5 mg by mouth 2 (two) times daily.        Marland Kitchen omeprazole (PRILOSEC) 40 MG capsule Take 1 capsule (40 mg total) by mouth daily.  30 capsule  6    Meds, vitals, and allergies reviewed.   ROS: See HPI.  Otherwise negative.    Physical exam: BP 136/90  Pulse 73  Temp(Src) 97.8 F (36.6 C) (Oral)  Wt 189 lb (85.73 kg)  GEN: nad, alert and oriented HEENT: mucous membranes moist NECK: supple CV: rrr.  PULM: ctab, no inc wob ABD: soft, +bs, suprapubic area not tender EXT: well perfused SKIN: no acute rash BACK: no CVA pain  1. Dysuria    Deteriorated. UA pos, will treat with cipro and send urine for culture. If culture shows no growth, refer to urology for cystoscopy/further work up. The patient indicates understanding of these issues and agrees with the plan.

## 2011-01-19 ENCOUNTER — Other Ambulatory Visit: Payer: Self-pay | Admitting: *Deleted

## 2011-01-19 MED ORDER — CEPHALEXIN 500 MG PO CAPS: ORAL_CAPSULE | ORAL | Status: DC

## 2011-02-09 ENCOUNTER — Telehealth: Payer: Self-pay | Admitting: *Deleted

## 2011-02-09 MED ORDER — CEPHALEXIN 500 MG PO CAPS: ORAL_CAPSULE | ORAL | Status: DC

## 2011-02-09 NOTE — Telephone Encounter (Signed)
Ok to refill one time, then needs to be seen. Rx sent.

## 2011-02-09 NOTE — Telephone Encounter (Signed)
Patient advised as instructed via telephone.  She has a f/u appt on 02/23/2011.

## 2011-02-09 NOTE — Telephone Encounter (Signed)
Pt was seen for UTI about 3 weeks ago.  She was given antibiotic and it cleared up but now it has come back, again has burning.  No fever.  She is asking that an antibiotic be called in, she doesn't want to come in for another office visit, says it's too expensive.

## 2011-02-10 ENCOUNTER — Other Ambulatory Visit: Payer: Self-pay | Admitting: Family Medicine

## 2011-02-10 DIAGNOSIS — Z136 Encounter for screening for cardiovascular disorders: Secondary | ICD-10-CM

## 2011-02-10 DIAGNOSIS — I10 Essential (primary) hypertension: Secondary | ICD-10-CM

## 2011-02-17 ENCOUNTER — Other Ambulatory Visit: Payer: BC Managed Care – PPO

## 2011-02-17 ENCOUNTER — Other Ambulatory Visit (INDEPENDENT_AMBULATORY_CARE_PROVIDER_SITE_OTHER): Payer: BC Managed Care – PPO | Admitting: Family Medicine

## 2011-02-17 DIAGNOSIS — Z136 Encounter for screening for cardiovascular disorders: Secondary | ICD-10-CM

## 2011-02-17 DIAGNOSIS — I10 Essential (primary) hypertension: Secondary | ICD-10-CM

## 2011-02-17 LAB — BASIC METABOLIC PANEL
CO2: 30 mEq/L (ref 19–32)
Calcium: 9.3 mg/dL (ref 8.4–10.5)
Creatinine, Ser: 0.8 mg/dL (ref 0.4–1.2)

## 2011-02-17 LAB — LIPID PANEL
Cholesterol: 178 mg/dL (ref 0–200)
LDL Cholesterol: 104 mg/dL — ABNORMAL HIGH (ref 0–99)
VLDL: 21.4 mg/dL (ref 0.0–40.0)

## 2011-02-24 ENCOUNTER — Other Ambulatory Visit (HOSPITAL_COMMUNITY)
Admission: RE | Admit: 2011-02-24 | Discharge: 2011-02-24 | Disposition: A | Discharge status: Home / Self Care | Payer: BC Managed Care – PPO | Source: Ambulatory Visit | Attending: Family Medicine | Admitting: Family Medicine

## 2011-02-24 ENCOUNTER — Ambulatory Visit (INDEPENDENT_AMBULATORY_CARE_PROVIDER_SITE_OTHER): Payer: BC Managed Care – PPO | Admitting: Family Medicine

## 2011-02-24 ENCOUNTER — Telehealth: Payer: Self-pay | Admitting: *Deleted

## 2011-02-24 ENCOUNTER — Encounter: Payer: Self-pay | Admitting: Family Medicine

## 2011-02-24 VITALS — BP 140/84 | HR 86 | Temp 97.8°F | Ht 65.75 in | Wt 187.8 lb

## 2011-02-24 DIAGNOSIS — I10 Essential (primary) hypertension: Secondary | ICD-10-CM

## 2011-02-24 DIAGNOSIS — K5190 Ulcerative colitis, unspecified, without complications: Secondary | ICD-10-CM

## 2011-02-24 DIAGNOSIS — Z0000 Encounter for general adult medical examination without abnormal findings: Secondary | ICD-10-CM

## 2011-02-24 DIAGNOSIS — D239 Other benign neoplasm of skin, unspecified: Secondary | ICD-10-CM

## 2011-02-24 DIAGNOSIS — D229 Melanocytic nevi, unspecified: Secondary | ICD-10-CM | POA: Insufficient documentation

## 2011-02-24 DIAGNOSIS — R7309 Other abnormal glucose: Secondary | ICD-10-CM

## 2011-02-24 DIAGNOSIS — Z01419 Encounter for gynecological examination (general) (routine) without abnormal findings: Secondary | ICD-10-CM | POA: Insufficient documentation

## 2011-02-24 DIAGNOSIS — Z1159 Encounter for screening for other viral diseases: Secondary | ICD-10-CM | POA: Insufficient documentation

## 2011-02-24 DIAGNOSIS — R739 Hyperglycemia, unspecified: Secondary | ICD-10-CM | POA: Insufficient documentation

## 2011-02-24 DIAGNOSIS — M899 Disorder of bone, unspecified: Secondary | ICD-10-CM

## 2011-02-24 MED ORDER — ESTROGENS, CONJUGATED 0.625 MG/GM VA CREA: TOPICAL_CREAM | Freq: Every day | VAGINAL | Status: DC

## 2011-02-24 NOTE — Patient Instructions (Signed)
Check with your insurance to see if they will cover the shingles shot. Please stop by to see Rosaria Ferries on your way out.

## 2011-02-24 NOTE — Telephone Encounter (Signed)
Patient said that she would like for estrogen cream to be called in. [facility].

## 2011-02-24 NOTE — Progress Notes (Signed)
60  yo female here for CPX.  Ulcerative colitis- distal disease (proctitis)- diagnosed in her late 2s.   Usually takes Asacol suppositories, as needed prednisone. Colonoscopy UTD. Does cause her hemorrhoids to flare.  Hyperglycemia- fasting CBG 113 this month.  Denies any increased thirst or urination. Admits to eating foods high in cards.    Well woman- UTD on most prevention.  I would like for her to have repeat DEXA given h/o osteopenia and PPI use.   Patient Active Problem List  Diagnoses  . HYPERTENSION  . GERD  . ULCERATIVE COLITIS  . OSTEOARTHRITIS  . BACK PAIN, LUMBAR, CHRONIC  . OSTEOPENIA  . Dysuria  . Breast mass, left  . UTI (lower urinary tract infection)  . Routine general medical examination at a health care facility   Past Medical History  Diagnosis Date  . Lumbago   . Dysuria   . Esophageal reflux   . Unspecified essential hypertension   . Osteoarthrosis, unspecified whether generalized or localized, unspecified site   . Disorder of bone and cartilage, unspecified   . Ulcerative colitis, unspecified    Past Surgical History  Procedure Date  . Lumbar disc surgery 2008  . Cesarean section     x2  . Cholecystectomy    History  Substance Use Topics  . Smoking status: Former Smoker    Quit date: 07/27/1980  . Smokeless tobacco: Never Used  . Alcohol Use: Yes     Once a month (maybe)   Family History  Problem Relation Age of Onset  . Heart attack Father 42  . Diabetes Father   . Lung cancer Mother    Allergies  Allergen Reactions  . Codeine     REACTION: stomach upset  . Erythromycin     REACTION: stomach upset   Current Outpatient Prescriptions on File Prior to Visit  Medication Sig Dispense Refill  . Calcium 1200-1000 MG-UNIT CHEW Chew 1 tablet by mouth daily.        . cephALEXin (KEFLEX) 500 MG capsule Take one tablet by mouth twice daily for seven days.  14 capsule  0  . cetirizine (ZYRTEC) 10 MG tablet Take 10 mg by mouth daily.         . Cranberry-Vitamin C-Vitamin E (CRANBERRY CONCENTRATE) 140-100-3 MG-MG-UNIT CAPS Take 1 tablet by mouth daily.        Marland Kitchen docusate sodium (COLACE) 100 MG capsule Take 100 mg by mouth daily.        . fluticasone (FLONASE) 50 MCG/ACT nasal spray 2 sprays by Nasal route as directed.        . hydrocortisone (ANUSOL-HC) 25 MG suppository Place 25 mg rectally 2 (two) times daily as needed.        . meloxicam (MOBIC) 15 MG tablet Take 15 mg by mouth daily.        . metoprolol tartrate (LOPRESSOR) 25 MG tablet Take 12.5 mg by mouth 2 (two) times daily.        Marland Kitchen omeprazole (PRILOSEC) 40 MG capsule Take 1 capsule (40 mg total) by mouth daily.  30 capsule  6  . The PMH, PSH, Social History, Family History, Medications, and allergies have been reviewed in [facility], and have been updated if relevant.  Review of Systems       See HPI Patient reports no  vision/ hearing changes,anorexia, weight change, fever ,adenopathy, persistant / recurrent hoarseness, swallowing issues, chest pain, edema,persistant / recurrent cough, hemoptysis, dyspnea(rest, exertional, paroxysmal nocturnal), gastrointestinal  bleeding (melena, rectal  bleeding), abdominal pain, excessive heart burn, GU symptoms(dysuria, hematuria, pyuria, voiding/incontinence  Issues) syncope, focal weakness, severe memory loss, concerning skin lesions, depression, anxiety, abnormal bruising/bleeding, major joint swelling, breast masses or abnormal vaginal bleeding.     Physical Exam BP 140/84  Pulse 86  Temp(Src) 97.8 F (36.6 C) (Oral)  Ht 5' 5.75" (1.67 m)  Wt 187 lb 12 oz (85.163 kg)  BMI 30.53 kg/m2  General:  Well-developed,well-nourished,in no acute distress; alert,appropriate and cooperative throughout examination Head:  normocephalic and atraumatic.   Eyes:  vision grossly intact, pupils equal, pupils round, and pupils reactive to light.   Ears:  R ear normal and L ear normal.   Nose:  no external deformity.   Mouth:  good dentition.   Neck:   No deformities, masses, or tenderness noted. Breasts:  No mass, nodules, thickening, tenderness, bulging, retraction, inflamation, nipple discharge or skin changes noted.   Lungs:  Normal respiratory effort, chest expands symmetrically. Lungs are clear to auscultation, no crackles or wheezes. Heart:  Normal rate and regular rhythm. S1 and S2 normal without gallop, murmur, click, rub or other extra sounds. Abdomen:  Bowel sounds positive,abdomen soft and non-tender without masses, organomegaly or hernias noted. Rectal:  no external abnormalities.   Genitalia:  Pelvic Exam:        External: normal female genitalia without lesions or masses        Vagina: normal without lesions or masses        Cervix: normal without lesions or masses        Adnexa: normal bimanual exam without masses or fullness        Uterus: normal by palpation        Pap smear: performed  Skin:  Multiple atypical nevi Assessment and Plan: 1. Routine general medical examination at a health care facility   Reviewed preventive care protocols, scheduled due services, and updated immunizations Discussed nutrition, exercise, diet, and healthy lifestyle. Discussed DEXA, shingles, Td. She wants to call her insurance company first.    2 Atypical nevi  Ambulatory referral to Dermatology  3 Hyperglycemia    Discussed eat right diet.  She is afraid to add Metformin given her h/o IBD.

## 2011-02-26 ENCOUNTER — Encounter: Payer: Self-pay | Admitting: *Deleted

## 2011-03-04 ENCOUNTER — Other Ambulatory Visit: Payer: Self-pay | Admitting: *Deleted

## 2011-03-04 MED ORDER — HYDROCORTISONE ACETATE 25 MG RE SUPP: 25.0000 mg | Freq: Two times a day (BID) | RECTAL | Status: DC | PRN

## 2011-03-11 ENCOUNTER — Ambulatory Visit (INDEPENDENT_AMBULATORY_CARE_PROVIDER_SITE_OTHER): Payer: BC Managed Care – PPO | Admitting: Family Medicine

## 2011-03-11 ENCOUNTER — Encounter: Payer: Self-pay | Admitting: Family Medicine

## 2011-03-11 VITALS — BP 130/84 | HR 80 | Temp 97.9°F | Wt 189.0 lb

## 2011-03-11 DIAGNOSIS — K5190 Ulcerative colitis, unspecified, without complications: Secondary | ICD-10-CM

## 2011-03-11 MED ORDER — PREDNISONE 20 MG PO TABS: ORAL_TABLET | ORAL | Status: DC

## 2011-03-11 NOTE — Progress Notes (Signed)
59year old  with history of ulcerative colitis presents with 1 week long flare of UC.  Blood, mucus and looser stools, low abdominal cramping, treated in past she has treated it with mesalamine and hydrocortisone suppository and or prednisone.  She stopped taking mesalamine because had fewer flares until recently. Taking abx for UTIs often a trigger for her.  Had colonoscopy a couple of years ago in [facility]. Does not have gastroenterologist here.  No fevers or chills. Eating ok.   Patient Active Problem List  Diagnoses  . HYPERTENSION  . GERD  . ULCERATIVE COLITIS  . OSTEOARTHRITIS  . BACK PAIN, LUMBAR, CHRONIC  . OSTEOPENIA  . Dysuria  . Breast mass, left  . UTI (lower urinary tract infection)  . Routine general medical examination at a health care facility  . Atypical nevi  . Hyperglycemia   Past Medical History  Diagnosis Date  . Lumbago   . Dysuria   . Esophageal reflux   . Unspecified essential hypertension   . Osteoarthrosis, unspecified whether generalized or localized, unspecified site   . Disorder of bone and cartilage, unspecified   . Ulcerative colitis, unspecified    Past Surgical History  Procedure Date  . Lumbar disc surgery 2008  . Cesarean section     x2  . Cholecystectomy    History  Substance Use Topics  . Smoking status: Former Smoker    Quit date: 07/27/1980  . Smokeless tobacco: Never Used  . Alcohol Use: Yes     Once a month (maybe)   Family History  Problem Relation Age of Onset  . Heart attack Father 58  . Diabetes Father   . Lung cancer Mother    Allergies  Allergen Reactions  . Codeine     REACTION: stomach upset  . Erythromycin     REACTION: stomach upset   Current Outpatient Prescriptions on File Prior to Visit  Medication Sig Dispense Refill  . Calcium 1200-1000 MG-UNIT CHEW Chew 1 tablet by mouth daily.        . cephALEXin (KEFLEX) 500 MG capsule Take one tablet by mouth twice daily for seven days.  14 capsule  0  .  cetirizine (ZYRTEC) 10 MG tablet Take 10 mg by mouth daily.        Marland Kitchen conjugated estrogens (PREMARIN) vaginal cream Place vaginally daily.  42.5 g  12  . Cranberry-Vitamin C-Vitamin E (CRANBERRY CONCENTRATE) 140-100-3 MG-MG-UNIT CAPS Take 1 tablet by mouth daily.        Marland Kitchen docusate sodium (COLACE) 100 MG capsule Take 100 mg by mouth daily.        . fluticasone (FLONASE) 50 MCG/ACT nasal spray 2 sprays by Nasal route as directed.        . hydrocortisone (ANUSOL-HC) 25 MG suppository Place 1 suppository (25 mg total) rectally 2 (two) times daily as needed.  12 suppository  3  . meloxicam (MOBIC) 15 MG tablet Take 15 mg by mouth daily.        . metoprolol tartrate (LOPRESSOR) 25 MG tablet Take 12.5 mg by mouth 2 (two) times daily.        Marland Kitchen omeprazole (PRILOSEC) 40 MG capsule Take 1 capsule (40 mg total) by mouth daily.  30 capsule  6   The PMH, PSH, Social History, Family History, Medications, and allergies have been reviewed in [facility], and have been updated if relevant.    Review of Systems       See HPI General:  Denies chills and fever.  GI:  Complains of abdominal pain, bloody stools, and diarrhea.   Physical Exam BP 130/84  Pulse 80  Temp(Src) 97.9 F (36.6 C) (Oral)  Wt 189 lb (85.73 kg)  General:  overweight appearing female in NAD  Mouth:  MMM Abdomen:  mild left lower quadrant pain no rebound, no guarding, NABS Psych:  Cognition and judgment appear intact. Alert and cooperative with normal attention span and concentration. No apparent delusions, illusions, hallucinations  Assessment and Plan: 1. ULCERATIVE COLITIS    Deteriorated. Advised referral to GI and restarting mesalamine.  Pt would like to hold off at this time on both. Will give prednisone for this flare but will urge GI referral and mesalamine if another flare occurs. The patient indicates understanding of these issues and agrees with the plan.

## 2011-03-19 ENCOUNTER — Other Ambulatory Visit: Payer: Self-pay | Admitting: *Deleted

## 2011-03-19 MED ORDER — FLUTICASONE PROPIONATE 50 MCG/ACT NA SUSP: 2.0000 | NASAL | Status: DC

## 2011-03-23 ENCOUNTER — Encounter: Payer: Self-pay | Admitting: Family Medicine

## 2011-03-23 ENCOUNTER — Ambulatory Visit (INDEPENDENT_AMBULATORY_CARE_PROVIDER_SITE_OTHER): Payer: BC Managed Care – PPO | Admitting: Family Medicine

## 2011-03-23 VITALS — BP 122/80 | HR 80 | Temp 98.7°F | Wt 186.8 lb

## 2011-03-23 DIAGNOSIS — J329 Chronic sinusitis, unspecified: Secondary | ICD-10-CM

## 2011-03-23 MED ORDER — SULFAMETHOXAZOLE-TRIMETHOPRIM 800-160 MG PO TABS: 1.0000 | ORAL_TABLET | Freq: Two times a day (BID) | ORAL | Status: AC

## 2011-03-23 NOTE — Progress Notes (Signed)
SUBJECTIVE:  Brittney Pierce is a 60 y.o. female who complains of coryza, sore throat, swollen glands, nasal blockage, myalgias and headache for 3 days. She denies a history of chest pain, nausea, shortness of breath and vomiting and denies a history of asthma. Patient denies smoke cigarettes.   Patient Active Problem List  Diagnoses  . HYPERTENSION  . GERD  . ULCERATIVE COLITIS  . OSTEOARTHRITIS  . BACK PAIN, LUMBAR, CHRONIC  . OSTEOPENIA  . Dysuria  . Breast mass, left  . UTI (lower urinary tract infection)  . Routine general medical examination at a health care facility  . Atypical nevi  . Hyperglycemia   Past Medical History  Diagnosis Date  . Lumbago   . Dysuria   . Esophageal reflux   . Unspecified essential hypertension   . Osteoarthrosis, unspecified whether generalized or localized, unspecified site   . Disorder of bone and cartilage, unspecified   . Ulcerative colitis, unspecified    Past Surgical History  Procedure Date  . Lumbar disc surgery 2008  . Cesarean section     x2  . Cholecystectomy    History  Substance Use Topics  . Smoking status: Former Smoker    Quit date: 07/27/1980  . Smokeless tobacco: Never Used  . Alcohol Use: Yes     Once a month (maybe)   Family History  Problem Relation Age of Onset  . Heart attack Father 68  . Diabetes Father   . Lung cancer Mother    Allergies  Allergen Reactions  . Keflex   . Codeine     REACTION: stomach upset  . Erythromycin     REACTION: stomach upset   Current Outpatient Prescriptions on File Prior to Visit  Medication Sig Dispense Refill  . Calcium 1200-1000 MG-UNIT CHEW Chew 1 tablet by mouth daily.        . cetirizine (ZYRTEC) 10 MG tablet Take 10 mg by mouth daily.        Marland Kitchen conjugated estrogens (PREMARIN) vaginal cream Place vaginally daily.  42.5 g  12  . Cranberry-Vitamin C-Vitamin E (CRANBERRY CONCENTRATE) 140-100-3 MG-MG-UNIT CAPS Take 1 tablet by mouth daily.        Marland Kitchen docusate sodium  (COLACE) 100 MG capsule Take 100 mg by mouth daily.        . fluticasone (FLONASE) 50 MCG/ACT nasal spray Place 2 sprays into the nose as directed.  16 g  3  . hydrocortisone (ANUSOL-HC) 25 MG suppository Place 1 suppository (25 mg total) rectally 2 (two) times daily as needed.  12 suppository  3  . meloxicam (MOBIC) 15 MG tablet Take 15 mg by mouth daily.        . metoprolol tartrate (LOPRESSOR) 25 MG tablet Take 12.5 mg by mouth 2 (two) times daily.        Marland Kitchen omeprazole (PRILOSEC) 40 MG capsule Take 1 capsule (40 mg total) by mouth daily.  30 capsule  6   The PMH, PSH, Social History, Family History, Medications, and allergies have been reviewed in [facility], and have been updated if relevant.  OBJECTIVE: BP 122/80  Pulse 80  Temp(Src) 98.7 F (37.1 C) (Oral)  Wt 186 lb 12 oz (84.709 kg)  She appears well, vital signs are as noted. Ears normal.  Throat and pharynx normal.  Neck supple. No adenopathy in the neck. Nose is congested. Sinuses non tender. The chest is clear, without wheezes or rales.  ASSESSMENT:  sinusitis  PLAN: Symptomatic therapy suggested: push fluids, rest  and return office visit prn if symptoms persist or worsen. Pt is leaving town, will go ahead and send in rx for Bactrim. Call or return to clinic prn if these symptoms worsen or fail to improve as anticipated.

## 2011-04-02 ENCOUNTER — Other Ambulatory Visit: Payer: Self-pay

## 2011-04-02 MED ORDER — MELOXICAM 15 MG PO TABS: 15.0000 mg | ORAL_TABLET | Freq: Every day | ORAL | Status: DC

## 2011-04-02 NOTE — Telephone Encounter (Signed)
rx sent, further refills per PMD.  Thanks.

## 2011-04-02 NOTE — Telephone Encounter (Signed)
[facility] faxed refill request for Meloxicam 75m. Since Dr ADeborra Medinais out of office please advise.

## 2011-04-07 ENCOUNTER — Ambulatory Visit (INDEPENDENT_AMBULATORY_CARE_PROVIDER_SITE_OTHER): Payer: BC Managed Care – PPO | Admitting: Family Medicine

## 2011-04-07 DIAGNOSIS — Z23 Encounter for immunization: Secondary | ICD-10-CM

## 2011-04-07 NOTE — Progress Notes (Signed)
Zostavax given during nurse visit today.

## 2011-05-12 ENCOUNTER — Ambulatory Visit (INDEPENDENT_AMBULATORY_CARE_PROVIDER_SITE_OTHER): Payer: BC Managed Care – PPO | Admitting: Family Medicine

## 2011-05-12 ENCOUNTER — Encounter: Payer: Self-pay | Admitting: Family Medicine

## 2011-05-12 VITALS — BP 120/82 | HR 72 | Temp 98.7°F | Wt 191.8 lb

## 2011-05-12 DIAGNOSIS — Z23 Encounter for immunization: Secondary | ICD-10-CM

## 2011-05-12 DIAGNOSIS — K5190 Ulcerative colitis, unspecified, without complications: Secondary | ICD-10-CM

## 2011-05-12 MED ORDER — MESALAMINE 1000 MG RE SUPP: 1000.0000 mg | Freq: Every day | RECTAL | Status: DC

## 2011-05-12 NOTE — Patient Instructions (Signed)
Good to see you. Please stop by to see Rosaria Ferries on your way out to set up your GI referral.

## 2011-05-12 NOTE — Progress Notes (Signed)
60year old  with history of ulcerative colitis presents with recurrent UC symptoms.  Blood, mucus and looser stools, low abdominal cramping, treated in past she has treated it with mesalamine and hydrocortisone suppository and or prednisone.  Had colonoscopy a couple of years ago in [facility]. Does not have gastroenterologist here.  No fevers or chills. Eating ok.   Patient Active Problem List  Diagnoses  . HYPERTENSION  . GERD  . ULCERATIVE COLITIS  . OSTEOARTHRITIS  . BACK PAIN, LUMBAR, CHRONIC  . OSTEOPENIA  . Dysuria  . Breast mass, left  . UTI (lower urinary tract infection)  . Routine general medical examination at a health care facility  . Atypical nevi  . Hyperglycemia   Past Medical History  Diagnosis Date  . Lumbago   . Dysuria   . Esophageal reflux   . Unspecified essential hypertension   . Osteoarthrosis, unspecified whether generalized or localized, unspecified site   . Disorder of bone and cartilage, unspecified   . Ulcerative colitis, unspecified    Past Surgical History  Procedure Date  . Lumbar disc surgery 2008  . Cesarean section     x2  . Cholecystectomy    History  Substance Use Topics  . Smoking status: Former Smoker    Quit date: 07/27/1980  . Smokeless tobacco: Never Used  . Alcohol Use: Yes     Once a month (maybe)   Family History  Problem Relation Age of Onset  . Heart attack Father 13  . Diabetes Father   . Lung cancer Mother    Allergies  Allergen Reactions  . Keflex   . Codeine     REACTION: stomach upset  . Erythromycin     REACTION: stomach upset   Current Outpatient Prescriptions on File Prior to Visit  Medication Sig Dispense Refill  . Calcium 1200-1000 MG-UNIT CHEW Chew 1 tablet by mouth daily.        . cetirizine (ZYRTEC) 10 MG tablet Take 10 mg by mouth daily.        Marland Kitchen conjugated estrogens (PREMARIN) vaginal cream Place vaginally daily.  42.5 g  12  . Cranberry-Vitamin C-Vitamin E (CRANBERRY CONCENTRATE) 140-100-3  MG-MG-UNIT CAPS Take 1 tablet by mouth daily.        Marland Kitchen docusate sodium (COLACE) 100 MG capsule Take 100 mg by mouth daily.        . fluticasone (FLONASE) 50 MCG/ACT nasal spray Place 2 sprays into the nose as directed.  16 g  3  . hydrocortisone (ANUSOL-HC) 25 MG suppository Place 1 suppository (25 mg total) rectally 2 (two) times daily as needed.  12 suppository  3  . meloxicam (MOBIC) 15 MG tablet Take 1 tablet (15 mg total) by mouth daily.  30 tablet  0  . metoprolol tartrate (LOPRESSOR) 25 MG tablet Take 12.5 mg by mouth 2 (two) times daily.        Marland Kitchen omeprazole (PRILOSEC) 40 MG capsule Take 1 capsule (40 mg total) by mouth daily.  30 capsule  6   The PMH, PSH, Social History, Family History, Medications, and allergies have been reviewed in [facility], and have been updated if relevant.    Review of Systems       See HPI General:  Denies chills and fever. GI:  Complains of abdominal pain, bloody stools, and diarrhea.   Physical Exam BP 120/82  Pulse 72  Temp(Src) 98.7 F (37.1 C) (Oral)  Wt 191 lb 12 oz (86.977 kg)  General:  overweight appearing  female in NAD  Mouth:  MMM Abdomen:  mild left lower quadrant pain no rebound, no guarding, NABS Psych:  Cognition and judgment appear intact. Alert and cooperative with normal attention span and concentration. No apparent delusions, illusions, hallucinations  Assessment and Plan: 1. ULCERATIVE COLITIS  Ambulatory referral to Gastroenterology   Deteriorated. Advised referral to GI and restarting mesalamine.  The patient indicates understanding of these issues and agrees with the plan.

## 2011-05-28 ENCOUNTER — Other Ambulatory Visit: Payer: Self-pay | Admitting: Family Medicine

## 2011-06-17 ENCOUNTER — Ambulatory Visit (INDEPENDENT_AMBULATORY_CARE_PROVIDER_SITE_OTHER): Payer: BC Managed Care – PPO | Admitting: Family Medicine

## 2011-06-17 ENCOUNTER — Encounter: Payer: Self-pay | Admitting: Family Medicine

## 2011-06-17 VITALS — BP 120/72 | HR 103 | Temp 98.3°F | Ht 66.5 in | Wt 191.8 lb

## 2011-06-17 DIAGNOSIS — J111 Influenza due to unidentified influenza virus with other respiratory manifestations: Secondary | ICD-10-CM

## 2011-06-17 NOTE — Patient Instructions (Signed)
INFLUENZA Viral illness: High fever, headache, bad cough, body aches, sore throat, sometimes nausea, vomitting, diarrhea.  Usually quick onset  TREATMENT 2. Nose Sprays: Saline nasal spray, Can use Afrin or Neosynephrine, but no more than 3 days 3. Cough Suppressants: DM portion of cough med, or Strong prescription 4. Expectorant: Liquify Secretions (Guaifenesin) 5. Example: Mucinex (expectorant) 6. Take all prescribed meds 7. Anti-virals may be used if caught early or in high risk people. (Do NOT if pregnant, breast feeding, seizure disorder) 8. Rest helpful, but move some during day 9. Breathe moist air: Humidifier, Vaporizer, or steam from shower 10. No work or school until no fever for 24 hrs WITH NO Tylenol or Ibuprofen. 11. Pneumonia on top of Flu is possible, if you are doing poorly particularly if a smoker or have COPD, please let us know. 12. Wash hands and cover mouth with cough  NYQUIL At night

## 2011-06-17 NOTE — Progress Notes (Signed)
  Patient Name: Brittney Pierce Date of Birth: [birth year] Age: 60 y.o. Medical Record Number: [id number] Gender: female  History of Present Illness:  Brittney Pierce is a 60 y.o. very pleasant female patient who presents with the following:  99.4 earlier -- baseline at 97.4 Granddaughter. She had the flu -  Another granddaughter had croup.   The worst part is head, throat, body and achy.   The patient has diffuse myalgias and arthralgias, she complains of headache, cough, sore throat, nausea, productive cough, fever, chills. She is having no laryngitis. Overall she feels very ill. She did have a flu shot this year. Generally, she is a fairly healthy lady.  Past Medical History, Surgical History, Social History, Family History, and Problem List have been reviewed in EHR and updated if relevant.  Review of Systems: ROS: GEN: Acute illness details above GI: Tolerating PO intake GU: maintaining adequate hydration and urination Pulm: No SOB Interactive and getting along well at home.  Otherwise, ROS is as per the HPI.   Physical Examination: Filed Vitals:   06/17/11 1559  BP: 120/72  Pulse: 103  Temp: 98.3 F (36.8 C)  TempSrc: Oral  Height: 5' 6.5" (1.689 m)  Weight: 191 lb 12.8 oz (87 kg)  SpO2: 99%     Gen: WDWN, NAD; A & O x3, cooperative. Pleasant.Globally Non-toxic HEENT: Normocephalic and atraumatic. Throat clear, w/o exudate, R TM clear, L TM - good landmarks, No fluid present. rhinnorhea. No frontal or maxillary sinus T. MMM NECK: Anterior cervical  LAD is present CV: RRR, No M/G/R, cap refill <2 sec PULM: Breathing comfortably in no respiratory distress. no wheezing, crackles, rhonchi ABD: S,NT,ND,+BS. No HSM. No rebound. EXT: No c/c/e PSYCH: Friendly, good eye contact MSK: Nml gait   Assessment and Plan: The patient's clinical exam and history is consistent with a diagnosis of influenza.  I discussed with the patient that the CDC and [place]  Infectious disease personel are recommended that we not do the rapid influenza screening test on people that have a clinical history and exam consistent with the diagnosis.  Supportive care. Fluids. Cough medicines as needed  Anti-pyretics.  Infection control emphasized, including OOW or school until AF 24 hours.   The patient has ulcerative colitis, so we are not going to start her on Tamiflu, and also we decided against doing any sign of narcotic-based cough syrup based on her ulcerative colitis, and prior experience with these.

## 2011-06-26 ENCOUNTER — Other Ambulatory Visit: Payer: Self-pay | Admitting: Family Medicine

## 2011-07-03 ENCOUNTER — Other Ambulatory Visit: Payer: Self-pay | Admitting: Gastroenterology

## 2011-07-08 ENCOUNTER — Ambulatory Visit (INDEPENDENT_AMBULATORY_CARE_PROVIDER_SITE_OTHER): Payer: BC Managed Care – PPO | Admitting: Family Medicine

## 2011-07-08 ENCOUNTER — Encounter: Payer: Self-pay | Admitting: Family Medicine

## 2011-07-08 VITALS — BP 122/82 | HR 68 | Temp 98.8°F | Wt 187.2 lb

## 2011-07-08 DIAGNOSIS — J069 Acute upper respiratory infection, unspecified: Secondary | ICD-10-CM

## 2011-07-08 DIAGNOSIS — J329 Chronic sinusitis, unspecified: Secondary | ICD-10-CM

## 2011-07-08 MED ORDER — AMOXICILLIN-POT CLAVULANATE 875-125 MG PO TABS: 1.0000 | ORAL_TABLET | Freq: Two times a day (BID) | ORAL | Status: AC

## 2011-07-08 MED ORDER — BENZONATATE 100 MG PO CAPS: 100.0000 mg | ORAL_CAPSULE | Freq: Four times a day (QID) | ORAL | Status: DC | PRN

## 2011-07-08 NOTE — Patient Instructions (Signed)
Good to see you. Take antibiotic as directed.  Drink lots of fluids.  Treat sympotmatically with Mucinex, nasal saline irrigation, and Tylenol/Ibuprofen. cough suppressant up to three times daily as needed.  Call if not improving as expected in 5-7 days.

## 2011-07-08 NOTE — Progress Notes (Signed)
SUBJECTIVE:  Brittney Pierce is a 60 y.o. female who complains of coryza, congestion, sneezing, sore throat, bilateral sinus pain and fever for 20 days. She denies a history of anorexia, chest pain, vomiting and weight loss and denies a history of asthma. Patient denies smoke cigarettes.   OBJECTIVE: BP 122/82  Pulse 68  Temp(Src) 98.8 F (37.1 C) (Oral)  Wt 187 lb 4 oz (84.936 kg)  She appears well, vital signs are as noted. TMs retracted and erythematous bilaterally, right >left.  Throat and pharynx normal.  Neck supple. No adenopathy in the neck. Nose is congested. Sinuses tender throughout. The chest is clear, without wheezes or rales.  Patient Active Problem List  Diagnoses  . HYPERTENSION  . GERD  . ULCERATIVE COLITIS  . OSTEOARTHRITIS  . BACK PAIN, LUMBAR, CHRONIC  . OSTEOPENIA  . Dysuria  . Breast mass, left  . UTI (lower urinary tract infection)  . Routine general medical examination at a health care facility  . Atypical nevi  . Hyperglycemia   Past Medical History  Diagnosis Date  . Lumbago   . Dysuria   . Esophageal reflux   . Unspecified essential hypertension   . Osteoarthrosis, unspecified whether generalized or localized, unspecified site   . Disorder of bone and cartilage, unspecified   . Ulcerative colitis, unspecified    Past Surgical History  Procedure Date  . Lumbar disc surgery 2008  . Cesarean section     x2  . Cholecystectomy    History  Substance Use Topics  . Smoking status: Former Smoker    Quit date: 07/27/1980  . Smokeless tobacco: Never Used  . Alcohol Use: Yes     Once a month (maybe)   Family History  Problem Relation Age of Onset  . Heart attack Father 29  . Diabetes Father   . Lung cancer Mother    Allergies  Allergen Reactions  . Keflex   . Codeine     REACTION: stomach upset  . Erythromycin     REACTION: stomach upset   Current Outpatient Prescriptions on File Prior to Visit  Medication Sig Dispense Refill  .  Calcium 1200-1000 MG-UNIT CHEW Chew 1 tablet by mouth daily.        . cetirizine (ZYRTEC) 10 MG tablet Take 10 mg by mouth daily.        . Cranberry-Vitamin C-Vitamin E (CRANBERRY CONCENTRATE) 140-100-3 MG-MG-UNIT CAPS Take 1 tablet by mouth daily.        Marland Kitchen docusate sodium (COLACE) 100 MG capsule Take 100 mg by mouth daily.        . fluticasone (FLONASE) 50 MCG/ACT nasal spray Place 2 sprays into the nose as directed.  16 g  3  . hydrocortisone (ANUSOL-HC) 25 MG suppository Place 1 suppository (25 mg total) rectally 2 (two) times daily as needed.  12 suppository  3  . meloxicam (MOBIC) 15 MG tablet Take 1 tablet (15 mg total) by mouth daily.  30 tablet  0  . mesalamine (CANASA) 1000 MG suppository Place 1 suppository (1,000 mg total) rectally at bedtime.  30 suppository  12  . metoprolol tartrate (LOPRESSOR) 25 MG tablet TAKE 1/2 TABLET BY MOUTH TWICE DAILY  180 tablet  3  . omeprazole (PRILOSEC) 40 MG capsule Take 1 capsule (40 mg total) by mouth daily.  30 capsule  6   The PMH, PSH, Social History, Family History, Medications, and allergies have been reviewed in [facility], and have been updated if relevant.  ASSESSMENT:  sinusitis  PLAN: Given duration and progression of symptoms, will treat for bacterial sinusitis. Symptomatic therapy suggested: push fluids, rest and return office visit prn if symptoms persist or worsen.  Call or return to clinic prn if these symptoms worsen or fail to improve as anticipated.

## 2011-08-03 ENCOUNTER — Encounter: Payer: Self-pay | Admitting: Family Medicine

## 2011-08-03 ENCOUNTER — Ambulatory Visit (INDEPENDENT_AMBULATORY_CARE_PROVIDER_SITE_OTHER): Payer: BC Managed Care – PPO | Admitting: Family Medicine

## 2011-08-03 DIAGNOSIS — J111 Influenza due to unidentified influenza virus with other respiratory manifestations: Secondary | ICD-10-CM

## 2011-08-03 MED ORDER — OSELTAMIVIR PHOSPHATE 75 MG PO CAPS: 75.0000 mg | ORAL_CAPSULE | Freq: Two times a day (BID) | ORAL | Status: AC

## 2011-08-03 MED ORDER — BENZONATATE 200 MG PO CAPS: 200.0000 mg | ORAL_CAPSULE | Freq: Two times a day (BID) | ORAL | Status: AC | PRN

## 2011-08-03 NOTE — Progress Notes (Signed)
SUBJECTIVE:  Brittney Pierce is a 61 y.o. female who complains of coryza, congestion, myalgias and fever for 2 days, Tmax 101. She denies a history of anorexia, nausea, shortness of breath, sweats, vomiting, weakness and wheezing and denies a history of asthma. Patient denies smoke cigarettes.  Patient Active Problem List  Diagnoses  . HYPERTENSION  . GERD  . ULCERATIVE COLITIS  . OSTEOARTHRITIS  . BACK PAIN, LUMBAR, CHRONIC  . OSTEOPENIA  . Dysuria  . Breast mass, left  . UTI (lower urinary tract infection)  . Routine general medical examination at a health care facility  . Atypical nevi  . Hyperglycemia   Past Medical History  Diagnosis Date  . Lumbago   . Dysuria   . Esophageal reflux   . Unspecified essential hypertension   . Osteoarthrosis, unspecified whether generalized or localized, unspecified site   . Disorder of bone and cartilage, unspecified   . Ulcerative colitis, unspecified    Past Surgical History  Procedure Date  . Lumbar disc surgery 2008  . Cesarean section     x2  . Cholecystectomy    History  Substance Use Topics  . Smoking status: Former Smoker    Quit date: 07/27/1980  . Smokeless tobacco: Never Used  . Alcohol Use: Yes     Once a month (maybe)   Family History  Problem Relation Age of Onset  . Heart attack Father 59  . Diabetes Father   . Lung cancer Mother    Allergies  Allergen Reactions  . Keflex   . Codeine     REACTION: stomach upset  . Erythromycin     REACTION: stomach upset   Current Outpatient Prescriptions on File Prior to Visit  Medication Sig Dispense Refill  . Calcium 1200-1000 MG-UNIT CHEW Chew 1 tablet by mouth daily.        . cetirizine (ZYRTEC) 10 MG tablet Take 10 mg by mouth daily.        . Cranberry-Vitamin C-Vitamin E (CRANBERRY CONCENTRATE) 140-100-3 MG-MG-UNIT CAPS Take 1 tablet by mouth daily.        Marland Kitchen docusate sodium (COLACE) 100 MG capsule Take 100 mg by mouth daily.        . fluticasone (FLONASE) 50  MCG/ACT nasal spray Place 2 sprays into the nose as directed.  16 g  3  . hydrocortisone (ANUSOL-HC) 25 MG suppository Place 1 suppository (25 mg total) rectally 2 (two) times daily as needed.  12 suppository  3  . meloxicam (MOBIC) 15 MG tablet Take 1 tablet (15 mg total) by mouth daily.  30 tablet  0  . mesalamine (CANASA) 1000 MG suppository Place 1 suppository (1,000 mg total) rectally at bedtime.  30 suppository  12  . metoprolol tartrate (LOPRESSOR) 25 MG tablet TAKE 1/2 TABLET BY MOUTH TWICE DAILY  180 tablet  3  . omeprazole (PRILOSEC) 40 MG capsule Take 1 capsule (40 mg total) by mouth daily.  30 capsule  6   The PMH, PSH, Social History, Family History, Medications, and allergies have been reviewed in [facility], and have been updated if relevant.   OBJECTIVE: BP 130/90  Pulse 64  Temp(Src) 98.7 F (37.1 C) (Oral)  Wt 186 lb 12 oz (84.709 kg)  She appears well, vital signs are as noted. Ears normal.  Throat and pharynx normal.  Neck supple. No adenopathy in the neck. Nose is congested. Sinuses non tender. The chest is clear, without wheezes or rales.  ASSESSMENT:  influenza  PLAN: Tamiflu as  directed. Symptomatic therapy suggested: push fluids, rest and return office visit prn if symptoms persist or worsen. Call or return to clinic prn if these symptoms worsen or fail to improve as anticipated.

## 2011-08-03 NOTE — Patient Instructions (Signed)
Good to see you. Take Tamiflu as directed. You can use Tessalon as needed for cough. Call if not improving over next 48 hours.

## 2011-08-09 ENCOUNTER — Other Ambulatory Visit: Payer: Self-pay | Admitting: Family Medicine

## 2011-09-08 ENCOUNTER — Other Ambulatory Visit: Payer: Self-pay | Admitting: *Deleted

## 2011-09-08 MED ORDER — MELOXICAM 15 MG PO TABS: 15.0000 mg | ORAL_TABLET | Freq: Every day | ORAL | Status: DC

## 2011-10-08 ENCOUNTER — Other Ambulatory Visit: Payer: Self-pay

## 2011-10-08 MED ORDER — OMEPRAZOLE 40 MG PO CPDR: 40.0000 mg | DELAYED_RELEASE_CAPSULE | Freq: Every day | ORAL | Status: DC

## 2011-10-08 MED ORDER — FLUTICASONE PROPIONATE 50 MCG/ACT NA SUSP: 2.0000 | NASAL | Status: DC

## 2011-10-08 MED ORDER — MELOXICAM 15 MG PO TABS: 15.0000 mg | ORAL_TABLET | Freq: Every day | ORAL | Status: DC

## 2011-10-08 NOTE — Telephone Encounter (Signed)
[facility] request 90 day supply of Meloxicam 15 mg. Pt last seen 08/03/11.Please advise.

## 2011-11-09 ENCOUNTER — Other Ambulatory Visit: Payer: Self-pay | Admitting: *Deleted

## 2011-11-09 MED ORDER — METOPROLOL TARTRATE 25 MG PO TABS: 25.0000 mg | ORAL_TABLET | Freq: Every day | ORAL | Status: DC

## 2011-11-19 ENCOUNTER — Other Ambulatory Visit: Payer: Self-pay | Admitting: Family Medicine

## 2011-12-22 ENCOUNTER — Other Ambulatory Visit: Payer: Self-pay | Admitting: Family Medicine

## 2012-01-22 ENCOUNTER — Other Ambulatory Visit: Payer: Self-pay | Admitting: Family Medicine

## 2012-02-26 ENCOUNTER — Other Ambulatory Visit: Payer: Self-pay | Admitting: Family Medicine

## 2012-03-28 ENCOUNTER — Other Ambulatory Visit: Payer: Self-pay | Admitting: Family Medicine

## 2012-04-26 ENCOUNTER — Other Ambulatory Visit: Payer: Self-pay | Admitting: Family Medicine

## 2012-05-13 ENCOUNTER — Other Ambulatory Visit: Payer: Self-pay | Admitting: Family Medicine

## 2012-05-13 DIAGNOSIS — Z0000 Encounter for general adult medical examination without abnormal findings: Secondary | ICD-10-CM

## 2012-05-13 DIAGNOSIS — Z136 Encounter for screening for cardiovascular disorders: Secondary | ICD-10-CM

## 2012-05-13 DIAGNOSIS — I10 Essential (primary) hypertension: Secondary | ICD-10-CM

## 2012-05-17 ENCOUNTER — Other Ambulatory Visit (INDEPENDENT_AMBULATORY_CARE_PROVIDER_SITE_OTHER): Payer: BC Managed Care – PPO

## 2012-05-17 DIAGNOSIS — Z136 Encounter for screening for cardiovascular disorders: Secondary | ICD-10-CM

## 2012-05-17 DIAGNOSIS — I10 Essential (primary) hypertension: Secondary | ICD-10-CM

## 2012-05-17 LAB — LIPID PANEL
Cholesterol: 176 mg/dL (ref 0–200)
LDL Cholesterol: 107 mg/dL — ABNORMAL HIGH (ref 0–99)
Triglycerides: 111 mg/dL (ref 0.0–149.0)
VLDL: 22.2 mg/dL (ref 0.0–40.0)

## 2012-05-17 LAB — COMPREHENSIVE METABOLIC PANEL
ALT: 27 U/L (ref 0–35)
Albumin: 3.8 g/dL (ref 3.5–5.2)
CO2: 28 mEq/L (ref 19–32)
GFR: 73.72 mL/min (ref >60.00)
Glucose, Bld: 112 mg/dL — ABNORMAL HIGH (ref 70–99)
Potassium: 4 mEq/L (ref 3.5–5.1)
Sodium: 139 mEq/L (ref 135–145)
Total Bilirubin: 0.6 mg/dL (ref 0.3–1.2)
Total Protein: 6.8 g/dL (ref 6.0–8.3)

## 2012-05-24 ENCOUNTER — Ambulatory Visit (INDEPENDENT_AMBULATORY_CARE_PROVIDER_SITE_OTHER): Payer: BC Managed Care – PPO | Admitting: Family Medicine

## 2012-05-24 ENCOUNTER — Encounter: Payer: Self-pay | Admitting: Family Medicine

## 2012-05-24 VITALS — BP 143/80 | HR 72 | Temp 97.6°F | Ht 65.5 in | Wt 192.0 lb

## 2012-05-24 DIAGNOSIS — R739 Hyperglycemia, unspecified: Secondary | ICD-10-CM

## 2012-05-24 DIAGNOSIS — R7309 Other abnormal glucose: Secondary | ICD-10-CM

## 2012-05-24 DIAGNOSIS — R5383 Other fatigue: Secondary | ICD-10-CM

## 2012-05-24 DIAGNOSIS — I10 Essential (primary) hypertension: Secondary | ICD-10-CM

## 2012-05-24 DIAGNOSIS — K5190 Ulcerative colitis, unspecified, without complications: Secondary | ICD-10-CM

## 2012-05-24 DIAGNOSIS — Z0000 Encounter for general adult medical examination without abnormal findings: Secondary | ICD-10-CM

## 2012-05-24 DIAGNOSIS — R5381 Other malaise: Secondary | ICD-10-CM

## 2012-05-24 LAB — CBC WITH DIFFERENTIAL/PLATELET
Basophils Absolute: 0.1 10*3/uL (ref 0.0–0.1)
Eosinophils Absolute: 0.2 10*3/uL (ref 0.0–0.7)
HCT: 41.9 % (ref 36.0–46.0)
Hemoglobin: 13.8 g/dL (ref 12.0–15.0)
Lymphs Abs: 2.4 10*3/uL (ref 0.7–4.0)
MCHC: 32.8 g/dL (ref 30.0–36.0)
MCV: 89.7 fl (ref 78.0–100.0)
Neutro Abs: 5 10*3/uL (ref 1.4–7.7)
RDW: 13.3 % (ref 11.5–14.6)

## 2012-05-24 NOTE — Patient Instructions (Addendum)
Good to see you. I will call you with your lab work.  Have a wonderful Halloween with the kids!

## 2012-05-24 NOTE — Progress Notes (Signed)
61 yo here for CPX.  Ulcerative colitis- distal disease (proctitis)- diagnosed in her late 63s.   Usually takes canasa suppositories which work quite well. Colonoscopy UTD.  Followed by [facility]. Has been quiet lately.     Hyperglycemia- fasting CBG elevated again this month.  Denies any increased thirst or urination.  Does eat a lot of starches still.  Fatigue- has noticed she has been more fatigued lately.  She is still working and has 3 grand children.   Sleeping ok.  Denies any anxiety or depression.  Lab Results  Component Value Date   CHOL 176 05/17/2012   HDL 46.80 05/17/2012   LDLCALC 107* 05/17/2012   TRIG 111.0 05/17/2012   CHOLHDL 4 05/17/2012      Well woman-  Due for influenza vaccine.  Patient Active Problem List  Diagnosis  . HYPERTENSION  . GERD  . ULCERATIVE COLITIS  . OSTEOARTHRITIS  . BACK PAIN, LUMBAR, CHRONIC  . OSTEOPENIA  . Dysuria  . Breast mass, left  . UTI (lower urinary tract infection)  . Routine general medical examination at a health care facility  . Atypical nevi  . Hyperglycemia   Past Medical History  Diagnosis Date  . Lumbago   . Dysuria   . Esophageal reflux   . Unspecified essential hypertension   . Osteoarthrosis, unspecified whether generalized or localized, unspecified site   . Disorder of bone and cartilage, unspecified   . Ulcerative colitis, unspecified    Past Surgical History  Procedure Date  . Lumbar disc surgery 2008  . Cesarean section     x2  . Cholecystectomy    History  Substance Use Topics  . Smoking status: Former Smoker    Quit date: 07/27/1980  . Smokeless tobacco: Never Used  . Alcohol Use: Yes     Once a month (maybe)   Family History  Problem Relation Age of Onset  . Heart attack Father 45  . Diabetes Father   . Lung cancer Mother    Allergies  Allergen Reactions  . Cephalexin   . Codeine     REACTION: stomach upset  . Erythromycin     REACTION: stomach upset   Current  Outpatient Prescriptions on File Prior to Visit  Medication Sig Dispense Refill  . Calcium 1200-1000 MG-UNIT CHEW Chew 1 tablet by mouth daily.        . cetirizine (ZYRTEC) 10 MG tablet Take 10 mg by mouth daily.        . Cranberry-Vitamin C-Vitamin E (CRANBERRY CONCENTRATE) 140-100-3 MG-MG-UNIT CAPS Take 1 tablet by mouth daily.        Marland Kitchen docusate sodium (COLACE) 100 MG capsule Take 100 mg by mouth daily.        . fluticasone (FLONASE) 50 MCG/ACT nasal spray USE 2 SPRAYS IN EACH NOSTRIL AS DIRECTED  16 g  1  . hydrocortisone (ANUSOL-HC) 25 MG suppository Place 1 suppository (25 mg total) rectally 2 (two) times daily as needed.  12 suppository  3  . meloxicam (MOBIC) 15 MG tablet TAKE 1 TABLET (15 MG TOTAL) BY MOUTH DAILY.  30 tablet  0  . mesalamine (CANASA) 1000 MG suppository Place 1 suppository (1,000 mg total) rectally at bedtime.  30 suppository  12  . metoprolol tartrate (LOPRESSOR) 25 MG tablet Take 1 tablet (25 mg total) by mouth daily.  30 tablet  0  . omeprazole (PRILOSEC) 40 MG capsule Take 1 capsule (40 mg total) by mouth daily.  30 capsule  6  .  The PMH, PSH, Social History, Family History, Medications, and allergies have been reviewed in [facility], and have been updated if relevant.  Review of Systems       See HPI Patient reports no  vision/ hearing changes,anorexia, weight change, fever ,adenopathy, persistant / recurrent hoarseness, swallowing issues, chest pain, edema,persistant / recurrent cough, hemoptysis, dyspnea(rest, exertional, paroxysmal nocturnal), gastrointestinal  bleeding (melena, rectal bleeding), abdominal pain, excessive heart burn, GU symptoms(dysuria, hematuria, pyuria, voiding/incontinence  Issues) syncope, focal weakness, severe memory loss, concerning skin lesions, depression, anxiety, abnormal bruising/bleeding, major joint swelling, breast masses or abnormal vaginal bleeding.     Physical Exam BP 143/80  Pulse 72  Temp 97.6 F (36.4 C)  Ht 5' 5.5" (1.664 m)   Wt 192 lb (87.091 kg)  BMI 31.46 kg/m2 BP Readings from Last 3 Encounters:  05/24/12 143/80  08/03/11 130/90  07/08/11 122/82   General:  Well-developed,well-nourished,in no acute distress; alert,appropriate and cooperative throughout examination Head:  normocephalic and atraumatic.   Eyes:  vision grossly intact, pupils equal, pupils round, and pupils reactive to light.   Ears:  R ear normal and L ear normal.   Nose:  no external deformity.   Mouth:  good dentition.   Neck:  No deformities, masses, or tenderness noted. Breasts:  No mass, nodules, thickening, tenderness, bulging, retraction, inflamation, nipple discharge or skin changes noted.   Lungs:  Normal respiratory effort, chest expands symmetrically. Lungs are clear to auscultation, no crackles or wheezes. Heart:  Normal rate and regular rhythm. S1 and S2 normal without gallop, murmur, click, rub or other extra sounds. Abdomen:  Bowel sounds positive,abdomen soft and non-tender without masses, organomegaly or hernias noted.  Assessment and Plan:  1. Routine general medical examination at a health care facility  Reviewed preventive care protocols, scheduled due services, and updated immunizations Discussed nutrition, exercise, diet, and healthy lifestyle.  Flu vaccine  2. HYPERTENSION  Slightly elevated this am but rushed to get here.  Has been normotensive at other doctor's visits recently.  Asymptomatic.  No change in rx necessary.   3. ULCERATIVE COLITIS  Quiet.   4. Hyperglycemia  Check a1c today.  Again discussed backing off on sugars and carbohydrates. Hemoglobin A1c  5. Fatigue  Likely due to busy lifestyle but will check labs to rule out other possible contributing factors. The patient indicates understanding of these issues and agrees with the plan.  TSH, T4, Free, CBC with Differential

## 2012-05-25 ENCOUNTER — Other Ambulatory Visit: Payer: Self-pay | Admitting: *Deleted

## 2012-05-25 MED ORDER — METOPROLOL TARTRATE 25 MG PO TABS: ORAL_TABLET | ORAL | Status: DC

## 2012-05-25 MED ORDER — OMEPRAZOLE 40 MG PO CPDR: 40.0000 mg | DELAYED_RELEASE_CAPSULE | Freq: Every day | ORAL | Status: DC

## 2012-05-25 MED ORDER — MELOXICAM 15 MG PO TABS: ORAL_TABLET | ORAL | Status: DC

## 2012-05-25 MED ORDER — FLUTICASONE PROPIONATE 50 MCG/ACT NA SUSP: NASAL | Status: DC

## 2012-05-25 NOTE — Telephone Encounter (Signed)
Meloxicam refilled per new Rx refill protocol for nurses/ CMA's.

## 2012-08-16 ENCOUNTER — Other Ambulatory Visit: Payer: Self-pay | Admitting: Family Medicine

## 2012-11-10 ENCOUNTER — Other Ambulatory Visit: Payer: Self-pay | Admitting: Family Medicine

## 2012-11-18 ENCOUNTER — Other Ambulatory Visit: Payer: Self-pay | Admitting: *Deleted

## 2012-11-21 MED ORDER — FLUTICASONE PROPIONATE 50 MCG/ACT NA SUSP: NASAL | Status: AC

## 2012-11-21 MED ORDER — OMEPRAZOLE 40 MG PO CPDR: DELAYED_RELEASE_CAPSULE | ORAL | Status: DC

## 2012-11-21 MED ORDER — MELOXICAM 15 MG PO TABS: ORAL_TABLET | ORAL | Status: DC

## 2012-12-14 ENCOUNTER — Other Ambulatory Visit: Payer: Self-pay | Admitting: Family Medicine

## 2013-01-31 ENCOUNTER — Other Ambulatory Visit: Payer: Self-pay | Admitting: Family Medicine

## 2013-02-27 ENCOUNTER — Other Ambulatory Visit: Payer: Self-pay | Admitting: Family Medicine

## 2013-04-03 ENCOUNTER — Other Ambulatory Visit: Payer: Self-pay | Admitting: Family Medicine

## 2013-04-18 ENCOUNTER — Encounter: Payer: Self-pay | Admitting: Family Medicine

## 2013-04-18 ENCOUNTER — Ambulatory Visit (INDEPENDENT_AMBULATORY_CARE_PROVIDER_SITE_OTHER): Payer: BC Managed Care – PPO | Admitting: Family Medicine

## 2013-04-18 VITALS — BP 123/78 | HR 74 | Ht 65.0 in | Wt 199.0 lb

## 2013-04-18 DIAGNOSIS — Z1151 Encounter for screening for human papillomavirus (HPV): Secondary | ICD-10-CM

## 2013-04-18 DIAGNOSIS — Z1239 Encounter for other screening for malignant neoplasm of breast: Secondary | ICD-10-CM

## 2013-04-18 DIAGNOSIS — Z124 Encounter for screening for malignant neoplasm of cervix: Secondary | ICD-10-CM

## 2013-04-18 DIAGNOSIS — Z01419 Encounter for gynecological examination (general) (routine) without abnormal findings: Secondary | ICD-10-CM

## 2013-04-18 NOTE — Progress Notes (Signed)
  Subjective:     Brittney Pierce is a 62 y.o. female and is here for a comprehensive physical exam. The patient reports problems - some mild urinary incontinence with sneezing and coughing.  Also, notes continued dribbling post void.  Notes no real urge incontinence.  Using a panty liner right now. Reports vaginal dryness.  Is not sexually active.  Reports mother had something akin to lichen sclerosis.  History   Social History  . Marital Status: Married    Spouse Name: N/A    Number of Children: N/A  . Years of Education: N/A   Occupational History  . Retired    Social History Main Topics  . Smoking status: Former Smoker    Quit date: 07/27/1980  . Smokeless tobacco: Never Used  . Alcohol Use: Yes     Comment: Once a month (maybe)  . Drug Use: No  . Sexual Activity: Not on file   Other Topics Concern  . Not on file   Social History Narrative   Regular exercise-yes      Moved here from [facility] to be closer to grandchildren      Likes to garden   Health Maintenance  Topic Date Due  . Tetanus/tdap  01/16/1968  . Mammogram  11/13/2012  . Influenza Vaccine  02/24/2013  . Pap Smear  02/23/2014  . Colonoscopy  08/07/2018  . Zostavax  Completed    The following portions of the patient's history were reviewed and updated as appropriate: allergies, current medications, past family history, past medical history, past social history, past surgical history and problem list.  Review of Systems Pertinent items are noted in HPI.   Objective:    BP 123/78  Pulse 74  Ht 5' 5"  (1.651 m)  Wt 199 lb (90.266 kg)  BMI 33.12 kg/m2 General appearance: alert, cooperative and appears stated age Head: Normocephalic, without obvious abnormality, atraumatic Neck: no adenopathy, supple, symmetrical, trachea midline and thyroid not enlarged, symmetric, no tenderness/mass/nodules Lungs: clear to auscultation bilaterally Breasts: normal appearance, no masses or tenderness Heart: regular  rate and rhythm, S1, S2 normal, no murmur, click, rub or gallop Abdomen: soft, non-tender; bowel sounds normal; no masses,  no organomegaly Pelvic: cervix normal in appearance, no adnexal masses or tenderness, no cervical motion tenderness, uterus normal size, shape, and consistency and vaginal atrophy, thin gray discharge Extremities: edema 1+ Pulses: 2+ and symmetric Skin: Skin color, texture, turgor normal. No rashes or lesions Lymph nodes: Cervical, supraclavicular, and axillary nodes normal. Neurologic: Grossly normal    Assessment:    Healthy female exam. Vaginal Atrophy. Post-menopausal.     Plan:    Pap smear today--should not need any further if negative with negative HPV. Mammogram. See After Visit Summary for Counseling Recommendations

## 2013-04-18 NOTE — Patient Instructions (Addendum)
Preventive Care for Adults, Female A healthy lifestyle and preventive care can promote health and wellness. Preventive health guidelines for women include the following key practices.  A routine yearly physical is a good way to check with your caregiver about your health and preventive screening. It is a chance to share any concerns and updates on your health, and to receive a thorough exam.  Visit your dentist for a routine exam and preventive care every 6 months. Brush your teeth twice a day and floss once a day. Good oral hygiene prevents tooth decay and gum disease.  The frequency of eye exams is based on your age, health, family medical history, use of contact lenses, and other factors. Follow your caregiver's recommendations for frequency of eye exams.  Eat a healthy diet. Foods like vegetables, fruits, whole grains, low-fat dairy products, and lean protein foods contain the nutrients you need without too many calories. Decrease your intake of foods high in solid fats, added sugars, and salt. Eat the right amount of calories for you.Get information about a proper diet from your caregiver, if necessary.  Regular physical exercise is one of the most important things you can do for your health. Most adults should get at least 150 minutes of moderate-intensity exercise (any activity that increases your heart rate and causes you to sweat) each week. In addition, most adults need muscle-strengthening exercises on 2 or more days a week.  Maintain a healthy weight. The body mass index (BMI) is a screening tool to identify possible weight problems. It provides an estimate of body fat based on height and weight. Your caregiver can help determine your BMI, and can help you achieve or maintain a healthy weight.For adults 20 years and older:  A BMI below 18.5 is considered underweight.  A BMI of 18.5 to 24.9 is normal.  A BMI of 25 to 29.9 is considered overweight.  A BMI of 30 and above is  considered obese.  Maintain normal blood lipids and cholesterol levels by exercising and minimizing your intake of saturated fat. Eat a balanced diet with plenty of fruit and vegetables. Blood tests for lipids and cholesterol should begin at age 78 and be repeated every 5 years. If your lipid or cholesterol levels are high, you are over 50, or you are at high risk for heart disease, you may need your cholesterol levels checked more frequently.Ongoing high lipid and cholesterol levels should be treated with medicines if diet and exercise are not effective.  If you smoke, find out from your caregiver how to quit. If you do not use tobacco, do not start.  If you are pregnant, do not drink alcohol. If you are breastfeeding, be very cautious about drinking alcohol. If you are not pregnant and choose to drink alcohol, do not exceed 1 drink per day. One drink is considered to be 12 ounces (355 mL) of beer, 5 ounces (148 mL) of wine, or 1.5 ounces (44 mL) of liquor.  Avoid use of street drugs. Do not share needles with anyone. Ask for help if you need support or instructions about stopping the use of drugs.  High blood pressure causes heart disease and increases the risk of stroke. Your blood pressure should be checked at least every 1 to 2 years. Ongoing high blood pressure should be treated with medicines if weight loss and exercise are not effective.  If you are 70 to 64 years old, ask your caregiver if you should take aspirin to prevent strokes.  Diabetes  screening involves taking a blood sample to check your fasting blood sugar level. This should be done once every 3 years, after age 24, if you are within normal weight and without risk factors for diabetes. Testing should be considered at a younger age or be carried out more frequently if you are overweight and have at least 1 risk factor for diabetes.  Breast cancer screening is essential preventive care for women. You should practice "breast  self-awareness." This means understanding the normal appearance and feel of your breasts and may include breast self-examination. Any changes detected, no matter how small, should be reported to a caregiver. Women in their 67s and 30s should have a clinical breast exam (CBE) by a caregiver as part of a regular health exam every 1 to 3 years. After age 46, women should have a CBE every year. Starting at age 32, women should consider having a mammography (breast X-ray test) every year. Women who have a family history of breast cancer should talk to their caregiver about genetic screening. Women at a high risk of breast cancer should talk to their caregivers about having magnetic resonance imaging (MRI) and a mammography every year.  The Pap test is a screening test for cervical cancer. A Pap test can show cell changes on the cervix that might become cervical cancer if left untreated. A Pap test is a procedure in which cells are obtained and examined from the lower end of the uterus (cervix).  Women should have a Pap test starting at age 44.  Between ages 20 and 32, Pap tests should be repeated every 2 years.  Beginning at age 81, you should have a Pap test every 3 years as long as the past 3 Pap tests have been normal.  Some women have medical problems that increase the chance of getting cervical cancer. Talk to your caregiver about these problems. It is especially important to talk to your caregiver if a new problem develops soon after your last Pap test. In these cases, your caregiver may recommend more frequent screening and Pap tests.  The above recommendations are the same for women who have or have not gotten the vaccine for human papillomavirus (HPV).  If you had a hysterectomy for a problem that was not cancer or a condition that could lead to cancer, then you no longer need Pap tests. Even if you no longer need a Pap test, a regular exam is a good idea to make sure no other problems are  starting.  If you are between ages 40 and 76, and you have had normal Pap tests going back 10 years, you no longer need Pap tests. Even if you no longer need a Pap test, a regular exam is a good idea to make sure no other problems are starting.  If you have had past treatment for cervical cancer or a condition that could lead to cancer, you need Pap tests and screening for cancer for at least 20 years after your treatment.  If Pap tests have been discontinued, risk factors (such as a new sexual partner) need to be reassessed to determine if screening should be resumed.  The HPV test is an additional test that may be used for cervical cancer screening. The HPV test looks for the virus that can cause the cell changes on the cervix. The cells collected during the Pap test can be tested for HPV. The HPV test could be used to screen women aged 47 years and older, and should  be used in women of any age who have unclear Pap test results. After the age of 50, women should have HPV testing at the same frequency as a Pap test.  Colorectal cancer can be detected and often prevented. Most routine colorectal cancer screening begins at the age of 24 and continues through age 71. However, your caregiver may recommend screening at an earlier age if you have risk factors for colon cancer. On a yearly basis, your caregiver may provide home test kits to check for hidden blood in the stool. Use of a small camera at the end of a tube, to directly examine the colon (sigmoidoscopy or colonoscopy), can detect the earliest forms of colorectal cancer. Talk to your caregiver about this at age 54, when routine screening begins. Direct examination of the colon should be repeated every 5 to 10 years through age 31, unless early forms of pre-cancerous polyps or small growths are found.  Hepatitis C blood testing is recommended for all people born from [birth year] through 1965 and any individual with known risks for hepatitis C.  Practice  safe sex. Use condoms and avoid high-risk sexual practices to reduce the spread of sexually transmitted infections (STIs). STIs include gonorrhea, chlamydia, syphilis, trichomonas, herpes, HPV, and human immunodeficiency virus (HIV). Herpes, HIV, and HPV are viral illnesses that have no cure. They can result in disability, cancer, and death. Sexually active women aged 48 and younger should be checked for chlamydia. Older women with new or multiple partners should also be tested for chlamydia. Testing for other STIs is recommended if you are sexually active and at increased risk.  Osteoporosis is a disease in which the bones lose minerals and strength with aging. This can result in serious bone fractures. The risk of osteoporosis can be identified using a bone density scan. Women ages 18 and over and women at risk for fractures or osteoporosis should discuss screening with their caregivers. Ask your caregiver whether you should take a calcium supplement or vitamin D to reduce the rate of osteoporosis.  Menopause can be associated with physical symptoms and risks. Hormone replacement therapy is available to decrease symptoms and risks. You should talk to your caregiver about whether hormone replacement therapy is right for you.  Use sunscreen with sun protection factor (SPF) of 30 or more. Apply sunscreen liberally and repeatedly throughout the day. You should seek shade when your shadow is shorter than you. Protect yourself by wearing long sleeves, pants, a wide-brimmed hat, and sunglasses year round, whenever you are outdoors.  Once a month, do a whole body skin exam, using a mirror to look at the skin on your back. Notify your caregiver of new moles, moles that have irregular borders, moles that are larger than a pencil eraser, or moles that have changed in shape or color.  Stay current with required immunizations.  Influenza. You need a dose every fall (or winter). The composition of the flu vaccine  changes each year, so being vaccinated once is not enough.  Pneumococcal polysaccharide. You need 1 to 2 doses if you smoke cigarettes or if you have certain chronic medical conditions. You need 1 dose at age 103 (or older) if you have never been vaccinated.  Tetanus, diphtheria, pertussis (Tdap, Td). Get 1 dose of Tdap vaccine if you are younger than age 72, are over 46 and have contact with an infant, are a Dietitian, are pregnant, or simply want to be protected from whooping cough. After that, you need a Td  booster dose every 10 years. Consult your caregiver if you have not had at least 3 tetanus and diphtheria-containing shots sometime in your life or have a deep or dirty wound.  HPV. You need this vaccine if you are a woman age 7 or younger. The vaccine is given in 3 doses over 6 months.  Measles, mumps, rubella (MMR). You need at least 1 dose of MMR if you were born in 1957 or later. You may also need a second dose.  Meningococcal. If you are age 29 to 71 and a first-year college student living in a residence hall, or have one of several medical conditions, you need to get vaccinated against meningococcal disease. You may also need additional booster doses.  Zoster (shingles). If you are age 81 or older, you should get this vaccine.  Varicella (chickenpox). If you have never had chickenpox or you were vaccinated but received only 1 dose, talk to your caregiver to find out if you need this vaccine.  Hepatitis A. You need this vaccine if you have a specific risk factor for hepatitis A virus infection or you simply wish to be protected from this disease. The vaccine is usually given as 2 doses, 6 to 18 months apart.  Hepatitis B. You need this vaccine if you have a specific risk factor for hepatitis B virus infection or you simply wish to be protected from this disease. The vaccine is given in 3 doses, usually over 6 months. Preventive Services / Frequency Ages 24 to 40  Blood  pressure check.** / Every 1 to 2 years.  Lipid and cholesterol check.** / Every 5 years beginning at age 33.  Clinical breast exam.** / Every 3 years for women in their 51s and 44s.  Pap test.** / Every 2 years from ages 93 through 5. Every 3 years starting at age 1 through age 3 or 74 with a history of 3 consecutive normal Pap tests.  HPV screening.** / Every 3 years from ages 64 through ages 57 to 26 with a history of 3 consecutive normal Pap tests.  Hepatitis C blood test.** / For any individual with known risks for hepatitis C.  Skin self-exam. / Monthly.  Influenza immunization.** / Every year.  Pneumococcal polysaccharide immunization.** / 1 to 2 doses if you smoke cigarettes or if you have certain chronic medical conditions.  Tetanus, diphtheria, pertussis (Tdap, Td) immunization. / A one-time dose of Tdap vaccine. After that, you need a Td booster dose every 10 years.  HPV immunization. / 3 doses over 6 months, if you are 45 and younger.  Measles, mumps, rubella (MMR) immunization. / You need at least 1 dose of MMR if you were born in 1957 or later. You may also need a second dose.  Meningococcal immunization. / 1 dose if you are age 41 to 77 and a first-year college student living in a residence hall, or have one of several medical conditions, you need to get vaccinated against meningococcal disease. You may also need additional booster doses.  Varicella immunization.** / Consult your caregiver.  Hepatitis A immunization.** / Consult your caregiver. 2 doses, 6 to 18 months apart.  Hepatitis B immunization.** / Consult your caregiver. 3 doses usually over 6 months. Ages 47 to 35  Blood pressure check.** / Every 1 to 2 years.  Lipid and cholesterol check.** / Every 5 years beginning at age 50.  Clinical breast exam.** / Every year after age 61.  Mammogram.** / Every year beginning at age 8  and continuing for as long as you are in good health. Consult with your  caregiver.  Pap test.** / Every 3 years starting at age 73 through age 1 or 23 with a history of 3 consecutive normal Pap tests.  HPV screening.** / Every 3 years from ages 34 through ages 36 to 46 with a history of 3 consecutive normal Pap tests.  Fecal occult blood test (FOBT) of stool. / Every year beginning at age 40 and continuing until age 51. You may not need to do this test if you get a colonoscopy every 10 years.  Flexible sigmoidoscopy or colonoscopy.** / Every 5 years for a flexible sigmoidoscopy or every 10 years for a colonoscopy beginning at age 14 and continuing until age 5.  Hepatitis C blood test.** / For all people born from [birth year] through 1967 and any individual with known risks for hepatitis C.  Skin self-exam. / Monthly.  Influenza immunization.** / Every year.  Pneumococcal polysaccharide immunization.** / 1 to 2 doses if you smoke cigarettes or if you have certain chronic medical conditions.  Tetanus, diphtheria, pertussis (Tdap, Td) immunization.** / A one-time dose of Tdap vaccine. After that, you need a Td booster dose every 10 years.  Measles, mumps, rubella (MMR) immunization. / You need at least 1 dose of MMR if you were born in 1957 or later. You may also need a second dose.  Varicella immunization.** / Consult your caregiver.  Meningococcal immunization.** / Consult your caregiver.  Hepatitis A immunization.** / Consult your caregiver. 2 doses, 6 to 18 months apart.  Hepatitis B immunization.** / Consult your caregiver. 3 doses, usually over 6 months. Ages 33 and over  Blood pressure check.** / Every 1 to 2 years.  Lipid and cholesterol check.** / Every 5 years beginning at age 31.  Clinical breast exam.** / Every year after age 38.  Mammogram.** / Every year beginning at age 44 and continuing for as long as you are in good health. Consult with your caregiver.  Pap test.** / Every 3 years starting at age 32 through age 42 or 39 with a 3  consecutive normal Pap tests. Testing can be stopped between 65 and 70 with 3 consecutive normal Pap tests and no abnormal Pap or HPV tests in the past 10 years.  HPV screening.** / Every 3 years from ages 45 through ages 62 or 11 with a history of 3 consecutive normal Pap tests. Testing can be stopped between 65 and 70 with 3 consecutive normal Pap tests and no abnormal Pap or HPV tests in the past 10 years.  Fecal occult blood test (FOBT) of stool. / Every year beginning at age 82 and continuing until age 26. You may not need to do this test if you get a colonoscopy every 10 years.  Flexible sigmoidoscopy or colonoscopy.** / Every 5 years for a flexible sigmoidoscopy or every 10 years for a colonoscopy beginning at age 31 and continuing until age 80.  Hepatitis C blood test.** / For all people born from [birth year] through 1967 and any individual with known risks for hepatitis C.  Osteoporosis screening.** / A one-time screening for women ages 19 and over and women at risk for fractures or osteoporosis.  Skin self-exam. / Monthly.  Influenza immunization.** / Every year.  Pneumococcal polysaccharide immunization.** / 1 dose at age 61 (or older) if you have never been vaccinated.  Tetanus, diphtheria, pertussis (Tdap, Td) immunization. / A one-time dose of Tdap vaccine if you are over  44 and have contact with an infant, are a Dietitian, or simply want to be protected from whooping cough. After that, you need a Td booster dose every 10 years.  Varicella immunization.** / Consult your caregiver.  Meningococcal immunization.** / Consult your caregiver.  Hepatitis A immunization.** / Consult your caregiver. 2 doses, 6 to 18 months apart.  Hepatitis B immunization.** / Check with your caregiver. 3 doses, usually over 6 months. ** Family history and personal history of risk and conditions may change your caregiver's recommendations. Document Released: 09/08/2001 Document Revised: 10/05/2011  Document Reviewed: 12/08/2010 [facility] Patient Information 2014 [place], [place]. Urinary Incontinence Your doctor wants you to have this information about urinary incontinence. This is the inability to keep urine in your body until you decide to release it. CAUSES  Prostate gland enlargement is a common cause of urinary incontinence. But there are many different causes for losing urinary control. They include:  Medicines.  Infections.  Prostate problems.  Surgery.  Neurological diseases.  Emotional factors. DIAGNOSIS  Evaluating the cause of incontinence is important in choosing the best treatment. This may require:  An ultrasound exam.  Kidney and bladder X-rays.  Cystoscopy. This is an exam of the bladder using a narrow scope. TREATMENT  For incontinent patients, normal daily hygiene and using changing pads or adult diapers regularly will prevent offensive odors and skin damage from the moisture. Changing your medicines may help control incontinence. Your caregiver may prescribe some medicines to help you regain control. Avoid caffeine. It can over-stimulate the bladder. Use the bathroom regularly. Try about every 2 to 3 hours even if you do not feel the need. Take time to empty your bladder completely. After urinating, wait a minute. Then try to urinate again. External devices used to catch urine or an indwelling urine catheter (Foley catheter) may be needed as well. Some prostate gland problems require surgery to correct. Call your caregiver for more information. Document Released: 08/20/2004 Document Revised: 10/05/2011 Document Reviewed: 08/15/2008 [facility] Patient Information 2014 [place], [place].

## 2013-04-25 ENCOUNTER — Ambulatory Visit (INDEPENDENT_AMBULATORY_CARE_PROVIDER_SITE_OTHER): Payer: BC Managed Care – PPO

## 2013-04-25 DIAGNOSIS — Z23 Encounter for immunization: Secondary | ICD-10-CM

## 2013-04-29 ENCOUNTER — Other Ambulatory Visit: Payer: Self-pay | Admitting: Family Medicine

## 2013-04-30 NOTE — Telephone Encounter (Signed)
Last office visit 05/24/2012.  Ok to refill?

## 2013-05-29 ENCOUNTER — Other Ambulatory Visit: Payer: Self-pay | Admitting: Family Medicine

## 2013-05-30 MED ORDER — MELOXICAM 15 MG PO TABS: ORAL_TABLET | ORAL | Status: DC

## 2013-05-30 NOTE — Telephone Encounter (Signed)
Give 30 day supply. Will need OV for further refills

## 2013-05-30 NOTE — Telephone Encounter (Signed)
Last office visit 05/24/2012.  Ok to refill?

## 2013-11-14 ENCOUNTER — Other Ambulatory Visit: Payer: Self-pay | Admitting: Internal Medicine

## 2013-11-14 ENCOUNTER — Other Ambulatory Visit: Payer: Self-pay | Admitting: Family Medicine

## 2013-11-16 ENCOUNTER — Other Ambulatory Visit: Payer: Self-pay | Admitting: Internal Medicine

## 2013-11-17 MED ORDER — MELOXICAM 15 MG PO TABS: ORAL_TABLET | ORAL | Status: DC

## 2013-11-17 MED ORDER — OMEPRAZOLE 40 MG PO CPDR: DELAYED_RELEASE_CAPSULE | ORAL | Status: DC

## 2014-02-09 ENCOUNTER — Other Ambulatory Visit (INDEPENDENT_AMBULATORY_CARE_PROVIDER_SITE_OTHER): Payer: Commercial Managed Care - HMO

## 2014-02-09 ENCOUNTER — Other Ambulatory Visit: Payer: Self-pay | Admitting: Family Medicine

## 2014-02-09 DIAGNOSIS — Z136 Encounter for screening for cardiovascular disorders: Secondary | ICD-10-CM

## 2014-02-09 DIAGNOSIS — Z0000 Encounter for general adult medical examination without abnormal findings: Secondary | ICD-10-CM | POA: Insufficient documentation

## 2014-02-09 LAB — LIPID PANEL
CHOL/HDL RATIO: 4
CHOLESTEROL: 161 mg/dL (ref 0–200)
HDL: 45.9 mg/dL (ref >39.00)
LDL CALC: 91 mg/dL (ref 0–99)
NonHDL: 115.1
Triglycerides: 119 mg/dL (ref 0.0–149.0)
VLDL: 23.8 mg/dL (ref 0.0–40.0)

## 2014-02-09 LAB — COMPREHENSIVE METABOLIC PANEL
ALT: 29 U/L (ref 0–35)
AST: 30 U/L (ref 0–37)
Albumin: 3.9 g/dL (ref 3.5–5.2)
Alkaline Phosphatase: 71 U/L (ref 39–117)
BILIRUBIN TOTAL: 0.6 mg/dL (ref 0.2–1.2)
BUN: 14 mg/dL (ref 6–23)
CO2: 26 mEq/L (ref 19–32)
Calcium: 9.2 mg/dL (ref 8.4–10.5)
Chloride: 107 mEq/L (ref 96–112)
Creatinine, Ser: 0.9 mg/dL (ref 0.4–1.2)
GFR: 65.1 mL/min (ref >60.00)
GLUCOSE: 115 mg/dL — AB (ref 70–99)
Potassium: 3.7 mEq/L (ref 3.5–5.1)
Sodium: 139 mEq/L (ref 135–145)
Total Protein: 6.5 g/dL (ref 6.0–8.3)

## 2014-02-09 LAB — CBC WITH DIFFERENTIAL/PLATELET
BASOS PCT: 0.8 % (ref 0.0–3.0)
Basophils Absolute: 0.1 10*3/uL (ref 0.0–0.1)
EOS PCT: 3.5 % (ref 0.0–5.0)
Eosinophils Absolute: 0.3 10*3/uL (ref 0.0–0.7)
HCT: 40 % (ref 36.0–46.0)
Hemoglobin: 13.4 g/dL (ref 12.0–15.0)
LYMPHS ABS: 3 10*3/uL (ref 0.7–4.0)
Lymphocytes Relative: 40.1 % (ref 12.0–46.0)
MCHC: 33.4 g/dL (ref 30.0–36.0)
MCV: 90.5 fl (ref 78.0–100.0)
Monocytes Absolute: 0.7 10*3/uL (ref 0.1–1.0)
Monocytes Relative: 9.1 % (ref 3.0–12.0)
Neutro Abs: 3.5 10*3/uL (ref 1.4–7.7)
Neutrophils Relative %: 46.5 % (ref 43.0–77.0)
PLATELETS: 229 10*3/uL (ref 150.0–400.0)
RBC: 4.42 Mil/uL (ref 3.87–5.11)
RDW: 13.8 % (ref 11.5–15.5)
WBC: 7.5 10*3/uL (ref 4.0–10.5)

## 2014-02-09 LAB — TSH: TSH: 0.74 u[IU]/mL (ref 0.35–4.50)

## 2014-02-13 ENCOUNTER — Ambulatory Visit (INDEPENDENT_AMBULATORY_CARE_PROVIDER_SITE_OTHER): Payer: Commercial Managed Care - HMO | Admitting: Family Medicine

## 2014-02-13 ENCOUNTER — Encounter: Payer: Self-pay | Admitting: Family Medicine

## 2014-02-13 VITALS — BP 134/76 | HR 69 | Temp 97.6°F | Ht 65.0 in | Wt 195.5 lb

## 2014-02-13 DIAGNOSIS — Z0000 Encounter for general adult medical examination without abnormal findings: Secondary | ICD-10-CM

## 2014-02-13 DIAGNOSIS — K5190 Ulcerative colitis, unspecified, without complications: Secondary | ICD-10-CM

## 2014-02-13 DIAGNOSIS — Z1231 Encounter for screening mammogram for malignant neoplasm of breast: Secondary | ICD-10-CM

## 2014-02-13 DIAGNOSIS — R7309 Other abnormal glucose: Secondary | ICD-10-CM

## 2014-02-13 DIAGNOSIS — Z23 Encounter for immunization: Secondary | ICD-10-CM

## 2014-02-13 DIAGNOSIS — Z780 Asymptomatic menopausal state: Secondary | ICD-10-CM

## 2014-02-13 DIAGNOSIS — R739 Hyperglycemia, unspecified: Secondary | ICD-10-CM

## 2014-02-13 DIAGNOSIS — M949 Disorder of cartilage, unspecified: Secondary | ICD-10-CM

## 2014-02-13 DIAGNOSIS — M899 Disorder of bone, unspecified: Secondary | ICD-10-CM

## 2014-02-13 DIAGNOSIS — I10 Essential (primary) hypertension: Secondary | ICD-10-CM

## 2014-02-13 MED ORDER — OMEPRAZOLE 40 MG PO CPDR: DELAYED_RELEASE_CAPSULE | ORAL | Status: DC

## 2014-02-13 MED ORDER — MELOXICAM 15 MG PO TABS: ORAL_TABLET | ORAL | Status: DC

## 2014-02-13 MED ORDER — METOPROLOL TARTRATE 25 MG PO TABS: ORAL_TABLET | ORAL | Status: DC

## 2014-02-13 NOTE — Addendum Note (Signed)
Addended by: Modena Nunnery on: 02/13/2014 09:57 AM   Modules accepted: Orders

## 2014-02-13 NOTE — Assessment & Plan Note (Signed)
Admits to eating a lot of sweets. She will work on diet.

## 2014-02-13 NOTE — Assessment & Plan Note (Signed)
DEXA ordered.  Pt to schedule.

## 2014-02-13 NOTE — Assessment & Plan Note (Signed)
The patients weight, height, BMI and visual acuity have been recorded in the chart I have made referrals, counseling and provided education to the patient based review of the above and I have provided the pt with a written personalized care plan for preventive services.  Pneumovax today. EKG- NSR Mammogram ordered- pt to scheduled.

## 2014-02-13 NOTE — Patient Instructions (Addendum)
Great to see you. Please call to schedule your mammogram and bone density. Have a wonderful summer. Try to cut back on sugar.

## 2014-02-13 NOTE — Progress Notes (Signed)
Pre visit review using our clinic review tool, if applicable. No additional management support is needed unless otherwise documented below in the visit note. 

## 2014-02-13 NOTE — Assessment & Plan Note (Signed)
Well controlled on current rx. No changes.

## 2014-02-13 NOTE — Progress Notes (Signed)
63 yo pleasant female here for welcome to medicare visit.  I have personally reviewed the Medicare Annual Wellness questionnaire and have noted 1. The patient's medical and social history 2. Their use of alcohol, tobacco or illicit drugs 3. Their current medications and supplements 4. The patient's functional ability including ADL's, fall risks, home safety risks and hearing or visual             impairment. 5. Diet and physical activities 6. Evidence for depression or mood disorders  End of life wishes discussed and updated in Social History.  The roster of all physicians providing medical care to patient - is listed in the Snapshot section of the chart.  Zoster 04/07/11 Colonoscopy 11/15/08- Dr. Marilynn Rail at [place] 11/06/10 Neg pap 04/19/13- no h/o abnormal pap smears or post menopausal bleeding.  Ulcerative colitis- distal disease (proctitis)- diagnosed in her late 42s.   [facility]. Has been quiet lately.   Feels probiotics have really helped. Lab Results  Component Value Date   CHOL 161 02/09/2014   HDL 45.90 02/09/2014   LDLCALC 91 02/09/2014   TRIG 119.0 02/09/2014   CHOLHDL 4 02/09/2014   Lab Results  Component Value Date   CREATININE 0.9 02/09/2014   Lab Results  Component Value Date   NA 139 02/09/2014   K 3.7 02/09/2014   CL 107 02/09/2014   CO2 26 02/09/2014   Lab Results  Component Value Date   WBC 7.5 02/09/2014   HGB 13.4 02/09/2014   HCT 40.0 02/09/2014   MCV 90.5 02/09/2014   PLT 229.0 02/09/2014   Lab Results  Component Value Date   TSH 0.74 02/09/2014     Patient Active Problem List   Diagnosis Date Noted  . Welcome to Medicare preventive visit 02/13/2014  . Atypical nevi 02/24/2011  . Hyperglycemia 02/24/2011  . HYPERTENSION 01/03/2010  . GERD 01/03/2010  . ULCERATIVE COLITIS 01/03/2010  . OSTEOARTHRITIS 01/03/2010  . BACK PAIN, LUMBAR, CHRONIC 01/03/2010  . OSTEOPENIA 01/03/2010   Past Medical History  Diagnosis  Date  . Lumbago   . Dysuria   . Esophageal reflux   . Unspecified essential hypertension   . Osteoarthrosis, unspecified whether generalized or localized, unspecified site   . Disorder of bone and cartilage, unspecified   . Ulcerative colitis, unspecified    Past Surgical History  Procedure Laterality Date  . Lumbar disc surgery  2008  . Cesarean section      x2  . Cholecystectomy     History  Substance Use Topics  . Smoking status: Former Smoker    Quit date: 07/27/1980  . Smokeless tobacco: Never Used  . Alcohol Use: Yes     Comment: Once a month (maybe)   Family History  Problem Relation Age of Onset  . Heart attack Father 58  . Diabetes Father   . Lung cancer Mother    Allergies  Allergen Reactions  . Cephalexin   . Codeine     REACTION: stomach upset  . Erythromycin     REACTION: stomach upset   Current Outpatient Prescriptions on File Prior to Visit  Medication Sig Dispense Refill  . Calcium 1200-1000 MG-UNIT CHEW Chew 1 tablet by mouth daily.        . cetirizine (ZYRTEC) 10 MG tablet Take 10 mg by mouth daily.        Mariane Baumgarten Calcium (STOOL SOFTENER PO) Take by mouth.      . fluticasone (FLONASE) 50 MCG/ACT  nasal spray 2 sprays each nostril as directed.  48 g  1  . Probiotic Product (PROBIOTIC DAILY PO) Take by mouth.       No current facility-administered medications on file prior to visit.  . The PMH, PSH, Social History, Family History, Medications, and allergies have been reviewed in [facility], and have been updated if relevant.  Review of Systems       See HPI Patient reports no  vision/ hearing changes,anorexia, weight change, fever ,adenopathy, persistant / recurrent hoarseness, swallowing issues, chest pain, edema,persistant / recurrent cough, hemoptysis, dyspnea(rest, exertional, paroxysmal nocturnal), gastrointestinal  bleeding (melena, rectal bleeding), abdominal pain, excessive heart burn, GU symptoms(dysuria, hematuria, pyuria, voiding/incontinence   Issues) syncope, focal weakness, severe memory loss, concerning skin lesions, depression, anxiety, abnormal bruising/bleeding, major joint swelling, breast masses or abnormal vaginal bleeding.   Wt Readings from Last 3 Encounters:  02/13/14 195 lb 8 oz (88.678 kg)  04/18/13 199 lb (90.266 kg)  05/24/12 192 lb (87.091 kg)     Physical Exam BP 134/76  Pulse 69  Temp(Src) 97.6 F (36.4 C) (Oral)  Ht 5' 5"  (1.651 m)  Wt 195 lb 8 oz (88.678 kg)  BMI 32.53 kg/m2  SpO2 95% BP Readings from Last 3 Encounters:  02/13/14 134/76  04/18/13 123/78  05/24/12 143/80   General:  Well-developed,well-nourished,in no acute distress; alert,appropriate and cooperative throughout examination Head:  normocephalic and atraumatic.   Eyes:  vision grossly intact, pupils equal, pupils round, and pupils reactive to light.   Ears:  R ear normal and L ear normal.   Nose:  no external deformity.   Mouth:  good dentition.   Neck:  No deformities, masses, or tenderness noted. Breasts:  No mass, nodules, thickening, tenderness, bulging, retraction, inflamation, nipple discharge or skin changes noted.   Lungs:  Normal respiratory effort, chest expands symmetrically. Lungs are clear to auscultation, no crackles or wheezes. Heart:  Normal rate and regular rhythm. S1 and S2 normal without gallop, murmur, click, rub or other extra sounds. Abdomen:  Bowel sounds positive,abdomen soft and non-tender without masses, organomegaly or hernias noted.  Assessment and Plan:

## 2014-02-13 NOTE — Assessment & Plan Note (Signed)
In remission.

## 2014-02-14 ENCOUNTER — Telehealth: Payer: Self-pay | Admitting: Family Medicine

## 2014-02-14 NOTE — Telephone Encounter (Signed)
Relevant patient education assigned to patient using Emmi. ° °

## 2014-03-02 ENCOUNTER — Ambulatory Visit
Admission: RE | Admit: 2014-03-02 | Discharge: 2014-03-02 | Disposition: A | Discharge status: Home / Self Care | Payer: Commercial Managed Care - HMO | Source: Ambulatory Visit | Attending: Family Medicine | Admitting: Family Medicine

## 2014-03-02 DIAGNOSIS — Z780 Asymptomatic menopausal state: Secondary | ICD-10-CM

## 2014-03-02 DIAGNOSIS — Z1231 Encounter for screening mammogram for malignant neoplasm of breast: Secondary | ICD-10-CM

## 2014-04-23 ENCOUNTER — Other Ambulatory Visit: Payer: Self-pay | Admitting: *Deleted

## 2014-04-23 MED ORDER — MELOXICAM 15 MG PO TABS: ORAL_TABLET | ORAL | Status: DC

## 2014-04-23 MED ORDER — METOPROLOL TARTRATE 25 MG PO TABS: ORAL_TABLET | ORAL | Status: DC

## 2014-05-07 ENCOUNTER — Ambulatory Visit (INDEPENDENT_AMBULATORY_CARE_PROVIDER_SITE_OTHER): Payer: Commercial Managed Care - HMO

## 2014-05-07 DIAGNOSIS — Z23 Encounter for immunization: Secondary | ICD-10-CM

## 2014-05-22 ENCOUNTER — Telehealth: Payer: Self-pay | Admitting: Family Medicine

## 2014-05-22 NOTE — Telephone Encounter (Signed)
Pt called in to inquire about [employer] referral that was placed a few weeks ago. Pt is requesting call back @ [phone]. Has appt with Dr Phillip Heal on 11/10.

## 2014-05-28 ENCOUNTER — Encounter: Payer: Self-pay | Admitting: Family Medicine

## 2014-09-17 ENCOUNTER — Other Ambulatory Visit: Payer: Self-pay

## 2014-09-17 NOTE — Telephone Encounter (Signed)
Pt left /vm; pt got letter from [facility] that refill for meloxicam and metorprolol was denied. Pt request cb. Pt last seen 02/13/14. Please advise.

## 2014-09-18 MED ORDER — MELOXICAM 15 MG PO TABS: ORAL_TABLET | ORAL | Status: DC

## 2014-09-18 MED ORDER — METOPROLOL TARTRATE 25 MG PO TABS: ORAL_TABLET | ORAL | Status: DC

## 2014-11-29 ENCOUNTER — Ambulatory Visit (INDEPENDENT_AMBULATORY_CARE_PROVIDER_SITE_OTHER): Payer: Commercial Managed Care - HMO | Admitting: Family Medicine

## 2014-11-29 ENCOUNTER — Encounter: Payer: Self-pay | Admitting: Family Medicine

## 2014-11-29 VITALS — BP 114/66 | HR 65 | Temp 97.5°F | Wt 196.0 lb

## 2014-11-29 DIAGNOSIS — T148 Other injury of unspecified body region: Secondary | ICD-10-CM

## 2014-11-29 DIAGNOSIS — L0201 Cutaneous abscess of face: Secondary | ICD-10-CM | POA: Insufficient documentation

## 2014-11-29 DIAGNOSIS — W57XXXA Bitten or stung by nonvenomous insect and other nonvenomous arthropods, initial encounter: Secondary | ICD-10-CM | POA: Diagnosis not present

## 2014-11-29 DIAGNOSIS — L03211 Cellulitis of face: Secondary | ICD-10-CM | POA: Diagnosis not present

## 2014-11-29 MED ORDER — DOXYCYCLINE HYCLATE 100 MG PO TABS: 100.0000 mg | ORAL_TABLET | Freq: Two times a day (BID) | ORAL | Status: DC

## 2014-11-29 MED ORDER — DEXAMETHASONE SODIUM PHOSPHATE 10 MG/ML IJ SOLN
10.0000 mg | Freq: Once | INTRAMUSCULAR | Status: AC
  Administered 2014-11-29: 10 mg via INTRAMUSCULAR

## 2014-11-29 NOTE — Progress Notes (Signed)
Subjective:   Patient ID: Brittney Pierce, female    DOB: 12/21/1950, 64 y.o.   MRN: [id number]  Brittney Pierce is a pleasant 64 y.o. year old female who presents to clinic today with Insect Bite  on 11/29/2014  HPI: Two days ago, woke up with what she thinks is an insect bite under her left cheek but she is not sure what bit her.  She did not see any insects, including ticks.  Thinks it was a bite because she had a bite mark on her chin and legs but those have been healing quickly. The possible bite mark under her left eye is now painful/throbbing, raised and draining clear/whitish liquid. No fevers.  Feels left side of face is swollen. No nausea or vomiting.  Current Outpatient Prescriptions on File Prior to Visit  Medication Sig Dispense Refill  . Calcium 1200-1000 MG-UNIT CHEW Chew 1 tablet by mouth daily.      . cetirizine (ZYRTEC) 10 MG tablet Take 10 mg by mouth daily.      Mariane Baumgarten Calcium (STOOL SOFTENER PO) Take by mouth.    . fluticasone (FLONASE) 50 MCG/ACT nasal spray 2 sprays each nostril as directed. 48 g 1  . meloxicam (MOBIC) 15 MG tablet TAKE ONE TABLET BY MOUTH DAILY 90 tablet 1  . metoprolol tartrate (LOPRESSOR) 25 MG tablet Take one half tablet twice a day 90 tablet 1  . omeprazole (PRILOSEC) 40 MG capsule TAKE 1 CAPSULE (40 MG TOTAL) BY MOUTH DAILY. 90 capsule 3  . Probiotic Product (PROBIOTIC DAILY PO) Take by mouth.     No current facility-administered medications on file prior to visit.    Allergies  Allergen Reactions  . Cephalexin   . Codeine     REACTION: stomach upset  . Erythromycin     REACTION: stomach upset    Past Medical History  Diagnosis Date  . Lumbago   . Dysuria   . Esophageal reflux   . Unspecified essential hypertension   . Osteoarthrosis, unspecified whether generalized or localized, unspecified site   . Disorder of bone and cartilage, unspecified   . Ulcerative colitis, unspecified     Past Surgical History  Procedure  Laterality Date  . Lumbar disc surgery  2008  . Cesarean section      x2  . Cholecystectomy      Family History  Problem Relation Age of Onset  . Heart attack Father 39  . Diabetes Father   . Lung cancer Mother     History   Social History  . Marital Status: Married    Spouse Name: N/A  . Number of Children: N/A  . Years of Education: N/A   Occupational History  . Retired    Social History Main Topics  . Smoking status: Former Smoker    Quit date: 07/27/1980  . Smokeless tobacco: Never Used  . Alcohol Use: Yes     Comment: Once a month (maybe)  . Drug Use: No  . Sexual Activity: Not on file   Other Topics Concern  . Not on file   Social History Narrative   Regular exercise-yes      Moved here from [facility] to be closer to grandchildren      Likes to garden      Does have a living will.  Husband is HPOA.   Would desire life support, no prolonged life support if futile.            The PMH, [place], Social  History, Family History, Medications, and allergies have been reviewed in [facility], and have been updated if relevant.   Review of Systems  Constitutional: Negative.   HENT: Negative for trouble swallowing.   Eyes: Negative.  Negative for pain.  Respiratory: Negative.   Cardiovascular: Negative.   Gastrointestinal: Negative.   Musculoskeletal: Negative.   Skin: Positive for rash and wound. Negative for pallor.  Neurological: Negative.   Psychiatric/Behavioral: Negative.   All other systems reviewed and are negative.      Objective:    BP 114/66 mmHg  Pulse 65  Temp(Src) 97.5 F (36.4 C) (Oral)  Wt 196 lb (88.905 kg)  SpO2 97%   Physical Exam  Constitutional: She is oriented to person, place, and time. She appears well-developed and well-nourished. No distress.  HENT:  Head: Normocephalic.  Eyes: Conjunctivae are normal.  Neck: Normal range of motion.  Cardiovascular: Normal rate.   Pulmonary/Chest: Effort normal.  Musculoskeletal: Normal range of  motion.  Neurological: She is alert and oriented to person, place, and time. No cranial nerve deficit.  Skin:     Psychiatric: She has a normal mood and affect. Her behavior is normal. Judgment and thought content normal.  Nursing note and vitals reviewed.         Assessment & Plan:   Insect bite - Plan: dexamethasone (DECADRON) injection 10 mg  Cellulitis and abscess of face No Follow-up on file.

## 2014-11-29 NOTE — Assessment & Plan Note (Signed)
New- given proximity to her eye, given decadron IM in office today to rapidly decrease inflammation. eRx sent for doxycyline 100 mg twice daily to cover MRSA/cellulitis. Discussed red flag symptoms requiring follow up, especially considering its location. Call or return to clinic prn if these symptoms worsen or fail to improve as anticipated. The patient indicates understanding of these issues and agrees with the plan.

## 2014-11-29 NOTE — Patient Instructions (Signed)
Please take doxycycline as directed- 1 tablet twice daily for 10 days. Keep me updated.

## 2014-11-29 NOTE — Progress Notes (Signed)
Pre visit review using our clinic review tool, if applicable. No additional management support is needed unless otherwise documented below in the visit note. 

## 2014-12-11 ENCOUNTER — Ambulatory Visit (INDEPENDENT_AMBULATORY_CARE_PROVIDER_SITE_OTHER): Payer: Commercial Managed Care - HMO | Admitting: Family Medicine

## 2014-12-11 ENCOUNTER — Encounter: Payer: Self-pay | Admitting: Family Medicine

## 2014-12-11 VITALS — BP 124/76 | HR 65 | Temp 97.7°F | Wt 197.2 lb

## 2014-12-11 DIAGNOSIS — F4001 Agoraphobia with panic disorder: Secondary | ICD-10-CM | POA: Diagnosis not present

## 2014-12-11 DIAGNOSIS — K5190 Ulcerative colitis, unspecified, without complications: Secondary | ICD-10-CM

## 2014-12-11 MED ORDER — HYDROCORTISONE ACETATE 25 MG RE SUPP: 25.0000 mg | Freq: Two times a day (BID) | RECTAL | Status: DC | PRN

## 2014-12-11 MED ORDER — ALPRAZOLAM 0.25 MG PO TABS: 0.2500 mg | ORAL_TABLET | Freq: Two times a day (BID) | ORAL | Status: DC | PRN

## 2014-12-11 NOTE — Progress Notes (Signed)
Subjective:   Patient ID: Brittney Pierce, female    DOB: 06-15-1951, 64 y.o.   MRN: [id number]  Brittney Pierce is a pleasant 64 y.o. year old female who presents to clinic today with Follow-up  on 12/11/2014  HPI: UC- symptoms greatly improved with probiotics.  Has not had a flare in quite some time.  She is asking for refill of anusol that she uses prn.  Fear of heights/flying- going on a trip to [place] and needs "something for my nerves."  Afraid of flying and then once she gets there, she gets very anxious driving up and down mountains.  Has never taking any benzos or anxiolytics.  Current Outpatient Prescriptions on File Prior to Visit  Medication Sig Dispense Refill  . Calcium 1200-1000 MG-UNIT CHEW Chew 1 tablet by mouth daily.      . cetirizine (ZYRTEC) 10 MG tablet Take 10 mg by mouth daily.      Mariane Baumgarten Calcium (STOOL SOFTENER PO) Take by mouth.    . fluticasone (FLONASE) 50 MCG/ACT nasal spray 2 sprays each nostril as directed. 48 g 1  . meloxicam (MOBIC) 15 MG tablet TAKE ONE TABLET BY MOUTH DAILY 90 tablet 1  . metoprolol tartrate (LOPRESSOR) 25 MG tablet Take one half tablet twice a day 90 tablet 1  . omeprazole (PRILOSEC) 40 MG capsule TAKE 1 CAPSULE (40 MG TOTAL) BY MOUTH DAILY. 90 capsule 3  . Probiotic Product (PROBIOTIC DAILY PO) Take by mouth.     No current facility-administered medications on file prior to visit.    Allergies  Allergen Reactions  . Cephalexin   . Codeine     REACTION: stomach upset  . Doxycycline     GI side effects  . Erythromycin     REACTION: stomach upset    Past Medical History  Diagnosis Date  . Lumbago   . Dysuria   . Esophageal reflux   . Unspecified essential hypertension   . Osteoarthrosis, unspecified whether generalized or localized, unspecified site   . Disorder of bone and cartilage, unspecified   . Ulcerative colitis, unspecified     Past Surgical History  Procedure Laterality Date  . Lumbar disc  surgery  2008  . Cesarean section      x2  . Cholecystectomy      Family History  Problem Relation Age of Onset  . Heart attack Father 18  . Diabetes Father   . Lung cancer Mother     History   Social History  . Marital Status: Married    Spouse Name: N/A  . Number of Children: N/A  . Years of Education: N/A   Occupational History  . Retired    Social History Main Topics  . Smoking status: Former Smoker    Quit date: 07/27/1980  . Smokeless tobacco: Never Used  . Alcohol Use: Yes     Comment: Once a month (maybe)  . Drug Use: No  . Sexual Activity: Not on file   Other Topics Concern  . Not on file   Social History Narrative   Regular exercise-yes      Moved here from [facility] to be closer to grandchildren      Likes to garden      Does have a living will.  Husband is HPOA.   Would desire life support, no prolonged life support if futile.            The PMH, PSH, Social History, Family History, Medications, and allergies  have been reviewed in [facility], and have been updated if relevant.   Review of Systems  Respiratory: Negative.   Genitourinary: Negative.  Negative for decreased urine volume.  Psychiatric/Behavioral: The patient is nervous/anxious.   All other systems reviewed and are negative.      Objective:    BP 124/76 mmHg  Pulse 65  Temp(Src) 97.7 F (36.5 C) (Oral)  Wt 197 lb 4 oz (89.472 kg)  SpO2 97%   Physical Exam  Constitutional: She is oriented to person, place, and time. She appears well-developed and well-nourished. No distress.  HENT:  Head: Normocephalic.  Eyes: Conjunctivae are normal.  Cardiovascular: Normal rate.   Pulmonary/Chest: Effort normal.  Neurological: She is alert and oriented to person, place, and time. No cranial nerve deficit.  Skin: Skin is warm and dry.  Psychiatric: She has a normal mood and affect. Her behavior is normal. Judgment and thought content normal.  Nursing note and vitals reviewed.           Assessment & Plan:   Ulcerative colitis, without complications  Fear of travel with panic attacks No Follow-up on file.

## 2014-12-11 NOTE — Assessment & Plan Note (Signed)
In remission. eRx sent for hydrocortisone suppositories to mail order pharmacy.

## 2014-12-11 NOTE — Assessment & Plan Note (Signed)
>  15 minutes spent in face to face time with patient, >50% spent in counselling or coordination of care. Rx given to pt to use 30 minutes prior to flying or riding in the car during her trip. Discussed sedation and addiction potential. The patient indicates understanding of these issues and agrees with the plan.

## 2014-12-11 NOTE — Progress Notes (Signed)
Pre visit review using our clinic review tool, if applicable. No additional management support is needed unless otherwise documented below in the visit note. 

## 2015-01-31 ENCOUNTER — Other Ambulatory Visit: Payer: Self-pay | Admitting: Family Medicine

## 2015-02-08 ENCOUNTER — Other Ambulatory Visit: Payer: Self-pay | Admitting: Family Medicine

## 2015-03-07 ENCOUNTER — Other Ambulatory Visit: Payer: Self-pay | Admitting: Internal Medicine

## 2015-03-07 DIAGNOSIS — I10 Essential (primary) hypertension: Secondary | ICD-10-CM

## 2015-03-15 ENCOUNTER — Other Ambulatory Visit (INDEPENDENT_AMBULATORY_CARE_PROVIDER_SITE_OTHER): Payer: Commercial Managed Care - HMO

## 2015-03-15 DIAGNOSIS — I10 Essential (primary) hypertension: Secondary | ICD-10-CM | POA: Diagnosis not present

## 2015-03-15 LAB — CBC
HEMATOCRIT: 42 % (ref 36.0–46.0)
HEMOGLOBIN: 14.1 g/dL (ref 12.0–15.0)
MCHC: 33.5 g/dL (ref 30.0–36.0)
MCV: 89.2 fl (ref 78.0–100.0)
Platelets: 244 10*3/uL (ref 150.0–400.0)
RBC: 4.71 Mil/uL (ref 3.87–5.11)
RDW: 13 % (ref 11.5–15.5)
WBC: 8.8 10*3/uL (ref 4.0–10.5)

## 2015-03-15 LAB — LIPID PANEL
CHOL/HDL RATIO: 4
CHOLESTEROL: 175 mg/dL (ref 0–200)
HDL: 45.6 mg/dL (ref >39.00)
LDL CALC: 104 mg/dL — AB (ref 0–99)
NonHDL: 129.19
TRIGLYCERIDES: 128 mg/dL (ref 0.0–149.0)
VLDL: 25.6 mg/dL (ref 0.0–40.0)

## 2015-03-15 LAB — COMPREHENSIVE METABOLIC PANEL
ALT: 19 U/L (ref 0–35)
AST: 22 U/L (ref 0–37)
Albumin: 4 g/dL (ref 3.5–5.2)
Alkaline Phosphatase: 78 U/L (ref 39–117)
BUN: 16 mg/dL (ref 6–23)
CALCIUM: 9.4 mg/dL (ref 8.4–10.5)
CHLORIDE: 104 meq/L (ref 96–112)
CO2: 27 meq/L (ref 19–32)
Creatinine, Ser: 0.8 mg/dL (ref 0.40–1.20)
GFR: 76.24 mL/min (ref >60.00)
GLUCOSE: 127 mg/dL — AB (ref 70–99)
Potassium: 4.3 mEq/L (ref 3.5–5.1)
Sodium: 139 mEq/L (ref 135–145)
Total Bilirubin: 0.4 mg/dL (ref 0.2–1.2)
Total Protein: 6.5 g/dL (ref 6.0–8.3)

## 2015-03-15 LAB — TSH: TSH: 2.14 u[IU]/mL (ref 0.35–4.50)

## 2015-03-20 ENCOUNTER — Ambulatory Visit (INDEPENDENT_AMBULATORY_CARE_PROVIDER_SITE_OTHER)
Admission: RE | Admit: 2015-03-20 | Discharge: 2015-03-20 | Disposition: A | Discharge status: Home / Self Care | Payer: Commercial Managed Care - HMO | Source: Ambulatory Visit | Attending: Family Medicine | Admitting: Family Medicine

## 2015-03-20 ENCOUNTER — Encounter: Payer: Self-pay | Admitting: Family Medicine

## 2015-03-20 ENCOUNTER — Ambulatory Visit (INDEPENDENT_AMBULATORY_CARE_PROVIDER_SITE_OTHER): Payer: Commercial Managed Care - HMO | Admitting: Family Medicine

## 2015-03-20 VITALS — BP 132/76 | HR 76 | Temp 97.6°F | Ht 65.25 in | Wt 192.8 lb

## 2015-03-20 DIAGNOSIS — M545 Low back pain: Secondary | ICD-10-CM | POA: Diagnosis not present

## 2015-03-20 DIAGNOSIS — Z0000 Encounter for general adult medical examination without abnormal findings: Secondary | ICD-10-CM

## 2015-03-20 DIAGNOSIS — K5190 Ulcerative colitis, unspecified, without complications: Secondary | ICD-10-CM | POA: Diagnosis not present

## 2015-03-20 DIAGNOSIS — Z23 Encounter for immunization: Secondary | ICD-10-CM

## 2015-03-20 DIAGNOSIS — R739 Hyperglycemia, unspecified: Secondary | ICD-10-CM

## 2015-03-20 DIAGNOSIS — K219 Gastro-esophageal reflux disease without esophagitis: Secondary | ICD-10-CM

## 2015-03-20 DIAGNOSIS — I10 Essential (primary) hypertension: Secondary | ICD-10-CM

## 2015-03-20 LAB — HEMOGLOBIN A1C: Hgb A1c MFr Bld: 6.2 % (ref 4.6–6.5)

## 2015-03-20 MED ORDER — METOPROLOL TARTRATE 25 MG PO TABS: ORAL_TABLET | ORAL | Status: DC

## 2015-03-20 MED ORDER — MELOXICAM 15 MG PO TABS: 15.0000 mg | ORAL_TABLET | Freq: Every day | ORAL | Status: DC

## 2015-03-20 MED ORDER — OMEPRAZOLE 40 MG PO CPDR: 40.0000 mg | DELAYED_RELEASE_CAPSULE | Freq: Every day | ORAL | Status: DC

## 2015-03-20 NOTE — Assessment & Plan Note (Signed)
The patients weight, height, BMI and visual acuity have been recorded in the chart.  Cognitive function assessed.   I have made referrals, counseling and provided education to the patient based review of the above and I have provided the pt with a written personalized care plan for preventive services.  Influenza vaccine Prevnar 13

## 2015-03-20 NOTE — Assessment & Plan Note (Signed)
New- check a1c today- concerning for diabetes. The patient indicates understanding of these issues and agrees with the plan.

## 2015-03-20 NOTE — Assessment & Plan Note (Signed)
Deteriorated. Lumbar xray today due to h/o back surgery. Will likely need MRI and or ortho referral. The patient indicates understanding of these issues and agrees with the plan.

## 2015-03-20 NOTE — Progress Notes (Signed)
Pre visit review using our clinic review tool, if applicable. No additional management support is needed unless otherwise documented below in the visit note. 

## 2015-03-20 NOTE — Assessment & Plan Note (Signed)
Symptoms well controlled with probiotics.

## 2015-03-20 NOTE — Addendum Note (Signed)
Addended by: Modena Nunnery on: 03/20/2015 09:04 AM   Modules accepted: Orders

## 2015-03-20 NOTE — Patient Instructions (Signed)
   Great to see you. We will call you with your xray results later today.

## 2015-03-20 NOTE — Progress Notes (Signed)
64 yo pleasant female here medicare wellness visit, follow up of chronic medical conditions and to discuss back pain.  I have personally reviewed the Medicare Annual Wellness questionnaire and have noted 1. The patient's medical and social history 2. Their use of alcohol, tobacco or illicit drugs 3. Their current medications and supplements 4. The patient's functional ability including ADL's, fall risks, home safety risks and hearing or visual             impairment. 5. Diet and physical activities 6. Evidence for depression or mood disorders  End of life wishes discussed and updated in Social History.  The roster of all physicians providing medical care to patient - is listed in the Snapshot section of the chart.  Back pain-  H/o back surgery years ago.  Starting to have low back pain with right sided sciatica again.  No known injury. No LE weakness. Sitting and walking both seem to make it worse.  Hyperglycemia- fasting glucose 127.  Admits to eating more sweets. Denies any increased thirst or urination. Has gained weight. Wt Readings from Last 3 Encounters:  03/20/15 192 lb 12 oz (87.431 kg)  12/11/14 197 lb 4 oz (89.472 kg)  11/29/14 196 lb (88.905 kg)    Zoster 04/07/11 Pneumovax 02/13/14 Colonoscopy 11/15/08- Dr. Marilynn Rail at [place] 03/02/2014 Neg pap 04/19/13- no h/o abnormal pap smears or post menopausal bleeding.  Ulcerative colitis- distal disease (proctitis)- diagnosed in her late 82s.   [facility]ed by [facility]. Has been quiet lately.   Feels probiotics have really helped. Lab Results  Component Value Date   CHOL 175 03/15/2015   HDL 45.60 03/15/2015   LDLCALC 104* 03/15/2015   TRIG 128.0 03/15/2015   CHOLHDL 4 03/15/2015   Lab Results  Component Value Date   CREATININE 0.80 03/15/2015   Lab Results  Component Value Date   NA 139 03/15/2015   K 4.3 03/15/2015   CL 104 03/15/2015   CO2 27 03/15/2015   Lab Results  Component Value Date    WBC 8.8 03/15/2015   HGB 14.1 03/15/2015   HCT 42.0 03/15/2015   MCV 89.2 03/15/2015   PLT 244.0 03/15/2015   Lab Results  Component Value Date   TSH 2.14 03/15/2015     Patient Active Problem List   Diagnosis Date Noted  . Medicare annual wellness visit, initial 03/20/2015  . Fear of travel with panic attacks 12/11/2014  . Atypical nevi 02/24/2011  . Hyperglycemia 02/24/2011  . Essential hypertension 01/03/2010  . GERD 01/03/2010  . Ulcerative colitis 01/03/2010  . Osteoarthritis 01/03/2010  . BACK PAIN, LUMBAR, CHRONIC 01/03/2010  . OSTEOPENIA 01/03/2010   Past Medical History  Diagnosis Date  . Lumbago   . Dysuria   . Esophageal reflux   . Unspecified essential hypertension   . Osteoarthrosis, unspecified whether generalized or localized, unspecified site   . Disorder of bone and cartilage, unspecified   . Ulcerative colitis, unspecified    Past Surgical History  Procedure Laterality Date  . Lumbar disc surgery  2008  . Cesarean section      x2  . Cholecystectomy     Social History  Substance Use Topics  . Smoking status: Former Smoker    Quit date: 07/27/1980  . Smokeless tobacco: Never Used  . Alcohol Use: Yes     Comment: Once a month (maybe)   Family History  Problem Relation Age of Onset  . Heart attack Father 60  . Diabetes Father   .  Lung cancer Mother    Allergies  Allergen Reactions  . Cephalexin   . Codeine     REACTION: stomach upset  . Doxycycline     GI side effects  . Erythromycin     REACTION: stomach upset   Current Outpatient Prescriptions on File Prior to Visit  Medication Sig Dispense Refill  . ALPRAZolam (XANAX) 0.25 MG tablet Take 1 tablet (0.25 mg total) by mouth 2 (two) times daily as needed for anxiety. 20 tablet 0  . Calcium 1200-1000 MG-UNIT CHEW Chew 1 tablet by mouth daily.      . cetirizine (ZYRTEC) 10 MG tablet Take 10 mg by mouth daily.      . fluticasone (FLONASE) 50 MCG/ACT nasal spray 2 sprays each nostril  as directed. 48 g 1  . hydrocortisone (ANUSOL-HC) 25 MG suppository Place 1 suppository (25 mg total) rectally 2 (two) times daily as needed. 12 suppository 3  . Probiotic Product (PROBIOTIC DAILY PO) Take by mouth.     No current facility-administered medications on file prior to visit.  . The PMH, PSH, Social History, Family History, Medications, and allergies have been reviewed in [facility], and have been updated if relevant.  Review of Systems  Constitutional: Negative.   HENT: Negative.   Respiratory: Negative.   Cardiovascular: Negative.   Gastrointestinal: Negative.   Endocrine: Negative.   Genitourinary: Negative.   Musculoskeletal: Positive for back pain.  Skin: Negative.   Allergic/Immunologic: Negative.   Neurological: Negative.   Hematological: Negative.   Psychiatric/Behavioral: Negative.   All other systems reviewed and are negative.   Wt Readings from Last 3 Encounters:  03/20/15 192 lb 12 oz (87.431 kg)  12/11/14 197 lb 4 oz (89.472 kg)  11/29/14 196 lb (88.905 kg)     Physical Exam BP 132/76 mmHg  Pulse 76  Temp(Src) 97.6 F (36.4 C) (Oral)  Ht 5' 5.25" (1.657 m)  Wt 192 lb 12 oz (87.431 kg)  BMI 31.84 kg/m2  SpO2 97% BP Readings from Last 3 Encounters:  03/20/15 132/76  12/11/14 124/76  11/29/14 114/66   Physical Exam  Constitutional: She is oriented to person, place, and time and well-developed, well-nourished, and in no distress. No distress.  HENT:  Head: Normocephalic and atraumatic.  Eyes: Conjunctivae are normal.  Neck: Normal range of motion.  Cardiovascular: Normal rate and regular rhythm.   Pulmonary/Chest: Effort normal and breath sounds normal. No respiratory distress. She has no wheezes. She has no rales. She exhibits no tenderness.  Abdominal: Soft.  Musculoskeletal: Normal range of motion. She exhibits no edema.  Neurological: She is alert and oriented to person, place, and time. No cranial nerve deficit.  Skin: Skin is warm and dry.  She is not diaphoretic.  Psychiatric: Mood, memory, affect and judgment normal.  Nursing note and vitals reviewed.

## 2015-03-21 ENCOUNTER — Other Ambulatory Visit: Payer: Self-pay | Admitting: Family Medicine

## 2015-03-21 DIAGNOSIS — M5440 Lumbago with sciatica, unspecified side: Secondary | ICD-10-CM

## 2015-08-21 ENCOUNTER — Other Ambulatory Visit: Payer: Self-pay

## 2015-08-21 MED ORDER — MESALAMINE 1000 MG RE SUPP: 1000.0000 mg | Freq: Every day | RECTAL | Status: DC

## 2015-08-21 NOTE — Telephone Encounter (Signed)
Pt left v/m requesting refill mesalamine to [facility]; ulcerative colitis has flaired up. Last annual exam 05/12/2015; rx last refilled # 30 suppositories x 12 on 05/12/2011.

## 2015-09-16 ENCOUNTER — Encounter: Payer: Self-pay | Admitting: Family Medicine

## 2015-10-02 ENCOUNTER — Other Ambulatory Visit: Payer: Self-pay | Admitting: Family Medicine

## 2016-02-03 ENCOUNTER — Telehealth: Payer: Self-pay | Admitting: Family Medicine

## 2016-02-03 NOTE — Telephone Encounter (Signed)
Called pt to sch CPE and AWV, voicemail was full so could not leave massage, mn

## 2016-04-16 ENCOUNTER — Other Ambulatory Visit: Payer: Self-pay | Admitting: Family Medicine

## 2016-04-16 DIAGNOSIS — Z0000 Encounter for general adult medical examination without abnormal findings: Secondary | ICD-10-CM

## 2016-04-22 ENCOUNTER — Other Ambulatory Visit (INDEPENDENT_AMBULATORY_CARE_PROVIDER_SITE_OTHER): Payer: Commercial Managed Care - HMO

## 2016-04-22 DIAGNOSIS — Z0000 Encounter for general adult medical examination without abnormal findings: Secondary | ICD-10-CM | POA: Diagnosis not present

## 2016-04-22 LAB — COMPREHENSIVE METABOLIC PANEL
ALT: 18 U/L (ref 0–35)
AST: 23 U/L (ref 0–37)
Albumin: 3.9 g/dL (ref 3.5–5.2)
Alkaline Phosphatase: 70 U/L (ref 39–117)
BUN: 14 mg/dL (ref 6–23)
CALCIUM: 9.1 mg/dL (ref 8.4–10.5)
CHLORIDE: 105 meq/L (ref 96–112)
CO2: 29 meq/L (ref 19–32)
Creatinine, Ser: 0.8 mg/dL (ref 0.40–1.20)
GFR: 75.98 mL/min (ref >60.00)
GLUCOSE: 105 mg/dL — AB (ref 70–99)
POTASSIUM: 3.7 meq/L (ref 3.5–5.1)
Sodium: 142 mEq/L (ref 135–145)
Total Bilirubin: 0.7 mg/dL (ref 0.2–1.2)
Total Protein: 6.6 g/dL (ref 6.0–8.3)

## 2016-04-22 LAB — CBC WITH DIFFERENTIAL/PLATELET
BASOS ABS: 0.1 10*3/uL (ref 0.0–0.1)
BASOS PCT: 1.3 % (ref 0.0–3.0)
Eosinophils Absolute: 0.3 10*3/uL (ref 0.0–0.7)
Eosinophils Relative: 3.8 % (ref 0.0–5.0)
HCT: 39.6 % (ref 36.0–46.0)
Hemoglobin: 13.7 g/dL (ref 12.0–15.0)
LYMPHS ABS: 3.2 10*3/uL (ref 0.7–4.0)
Lymphocytes Relative: 39.1 % (ref 12.0–46.0)
MCHC: 34.5 g/dL (ref 30.0–36.0)
MCV: 88.5 fl (ref 78.0–100.0)
MONOS PCT: 8.3 % (ref 3.0–12.0)
Monocytes Absolute: 0.7 10*3/uL (ref 0.1–1.0)
NEUTROS ABS: 3.9 10*3/uL (ref 1.4–7.7)
NEUTROS PCT: 47.5 % (ref 43.0–77.0)
PLATELETS: 245 10*3/uL (ref 150.0–400.0)
RBC: 4.48 Mil/uL (ref 3.87–5.11)
RDW: 13.4 % (ref 11.5–15.5)
WBC: 8.2 10*3/uL (ref 4.0–10.5)

## 2016-04-22 LAB — LIPID PANEL
Cholesterol: 171 mg/dL (ref 0–200)
HDL: 50.7 mg/dL (ref >39.00)
LDL CALC: 92 mg/dL (ref 0–99)
NONHDL: 120
Total CHOL/HDL Ratio: 3
Triglycerides: 138 mg/dL (ref 0.0–149.0)
VLDL: 27.6 mg/dL (ref 0.0–40.0)

## 2016-04-22 LAB — TSH: TSH: 1.78 u[IU]/mL (ref 0.35–4.50)

## 2016-04-28 ENCOUNTER — Ambulatory Visit (INDEPENDENT_AMBULATORY_CARE_PROVIDER_SITE_OTHER): Payer: Commercial Managed Care - HMO | Admitting: Family Medicine

## 2016-04-28 ENCOUNTER — Encounter: Payer: Self-pay | Admitting: Family Medicine

## 2016-04-28 VITALS — BP 136/82 | HR 73 | Temp 97.8°F | Ht 64.75 in | Wt 186.2 lb

## 2016-04-28 DIAGNOSIS — G4700 Insomnia, unspecified: Secondary | ICD-10-CM | POA: Diagnosis not present

## 2016-04-28 DIAGNOSIS — F4321 Adjustment disorder with depressed mood: Secondary | ICD-10-CM | POA: Insufficient documentation

## 2016-04-28 DIAGNOSIS — Z0000 Encounter for general adult medical examination without abnormal findings: Secondary | ICD-10-CM

## 2016-04-28 DIAGNOSIS — K51219 Ulcerative (chronic) proctitis with unspecified complications: Secondary | ICD-10-CM

## 2016-04-28 DIAGNOSIS — G8929 Other chronic pain: Secondary | ICD-10-CM

## 2016-04-28 DIAGNOSIS — K219 Gastro-esophageal reflux disease without esophagitis: Secondary | ICD-10-CM

## 2016-04-28 DIAGNOSIS — M545 Low back pain, unspecified: Secondary | ICD-10-CM

## 2016-04-28 DIAGNOSIS — F4320 Adjustment disorder, unspecified: Secondary | ICD-10-CM

## 2016-04-28 DIAGNOSIS — I10 Essential (primary) hypertension: Secondary | ICD-10-CM

## 2016-04-28 MED ORDER — TRAZODONE HCL 50 MG PO TABS
25.0000 mg | ORAL_TABLET | Freq: Every evening | ORAL | 3 refills | Status: DC | PRN
Start: 2016-04-28 — End: 2016-10-22

## 2016-04-28 MED ORDER — OMEPRAZOLE 40 MG PO CPDR: 40.0000 mg | DELAYED_RELEASE_CAPSULE | Freq: Every day | ORAL | 3 refills | Status: DC

## 2016-04-28 NOTE — Assessment & Plan Note (Addendum)
Appropriate grief reaction at this point. Declines referral. She would like rx for insomnia. See below. Discussed grief counseling through hospice.

## 2016-04-28 NOTE — Assessment & Plan Note (Signed)
Has been in remission. No changes made to rxs.

## 2016-04-28 NOTE — Assessment & Plan Note (Signed)
Well controlled NO changes made.

## 2016-04-28 NOTE — Assessment & Plan Note (Signed)
The patients weight, height, BMI and visual acuity have been recorded in the chart.  Cognitive function assessed.   I have made referrals, counseling and provided education to the patient based review of the above and I have provided the pt with a written personalized care plan for preventive services.

## 2016-04-28 NOTE — Patient Instructions (Addendum)
Great to see you. I am so sorry for your loss. We are starting trazodone 25- 50 mg nightly.  Please keep me updated.

## 2016-04-28 NOTE — Assessment & Plan Note (Signed)
Referral placed to pain clinic.

## 2016-04-28 NOTE — Progress Notes (Signed)
65 yo pleasant female here medicare wellness visit, follow up of chronic medical conditions.  I have personally reviewed the Medicare Annual Wellness questionnaire and have noted 1. The patient's medical and social history 2. Their use of alcohol, tobacco or illicit drugs 3. Their current medications and supplements 4. The patient's functional ability including ADL's, fall risks, home safety risks and hearing or visual             impairment. 5. Diet and physical activities 6. Evidence for depression or mood disorders  End of life wishes discussed and updated in Social History.  The roster of all physicians providing medical care to patient - is listed in the Snapshot section of the chart.   Zoster 04/07/11 Pneumovax 02/13/14 Prevnar 13 03/20/15 Colonoscopy 11/15/08- Dr. Marilynn Rail at [facility] 03/02/2014 Neg pap 04/19/13- no h/o abnormal pap smears or post menopausal bleeding.  Unfortunately, her husband passed away in 02-01-2023.  Chronic back pain - was seen by Dr. Rolena Infante who suggested either surgery or pain management.  Ulcerative colitis- distal disease (proctitis)- diagnosed in her late 56s.   Followed by [facility]. Has been in remission with probiotics. Does have Canasa suppositories to use as needed.  HTN- has been well controlled with metoprolol 12.5 mg twice daily.  GERD- taking prilosec 40 mg daily. Denies any symptoms of GERD currently. Lab Results  Component Value Date   CHOL 171 04/22/2016   HDL 50.70 04/22/2016   LDLCALC 92 04/22/2016   TRIG 138.0 04/22/2016   CHOLHDL 3 04/22/2016   Lab Results  Component Value Date   CREATININE 0.80 04/22/2016   Lab Results  Component Value Date   NA 142 04/22/2016   K 3.7 04/22/2016   CL 105 04/22/2016   CO2 29 04/22/2016   Lab Results  Component Value Date   WBC 8.2 04/22/2016   HGB 13.7 04/22/2016   HCT 39.6 04/22/2016   MCV 88.5 04/22/2016   PLT 245.0 04/22/2016   Lab Results  Component Value Date    TSH 1.78 04/22/2016     Patient Active Problem List  Diagnosis  . Essential hypertension  . GERD  . Ulcerative colitis ([place])  . Osteoarthritis  . BACK PAIN, LUMBAR, CHRONIC  . OSTEOPENIA  . Hyperglycemia  . Fear of travel with panic attacks  . Medicare annual wellness visit, subsequent  . Grief   Past Medical History:  Diagnosis Date  . Disorder of bone and cartilage, unspecified   . Dysuria   . Esophageal reflux   . Lumbago   . Osteoarthrosis, unspecified whether generalized or localized, unspecified site   . Ulcerative colitis, unspecified   . Unspecified essential hypertension    Past Surgical History:  Procedure Laterality Date  . CESAREAN SECTION     x2  . CHOLECYSTECTOMY    . [place] SURGERY  2008   Social History  Substance Use Topics  . Smoking status: Former Smoker    Quit date: 07/27/1980  . Smokeless tobacco: Never Used  . Alcohol use Yes     Comment: Once a month (maybe)   Family History  Problem Relation Age of Onset  . Heart attack Father 76  . Diabetes Father   . Lung cancer Mother    Allergies  Allergen Reactions  . Cephalexin   . Codeine     REACTION: stomach upset  . Doxycycline     GI side effects  . Erythromycin     REACTION: stomach upset  Current Outpatient Prescriptions on File Prior to Visit  Medication Sig Dispense Refill  . ALPRAZolam (XANAX) 0.25 MG tablet Take 1 tablet (0.25 mg total) by mouth 2 (two) times daily as needed for anxiety. 20 tablet 0  . Calcium 1200-1000 MG-UNIT CHEW Chew 1 tablet by mouth daily.      . cetirizine (ZYRTEC) 10 MG tablet Take 10 mg by mouth daily.      . fluticasone (FLONASE) 50 MCG/ACT nasal spray 2 sprays each nostril as directed. 48 g 1  . hydrocortisone (ANUSOL-HC) 25 MG suppository Place 1 suppository (25 mg total) rectally 2 (two) times daily as needed. 12 suppository 3  . meloxicam (MOBIC) 15 MG tablet TAKE 1 TABLET EVERY DAY 90 tablet 1  . mesalamine (CANASA) 1000 MG suppository  Place 1 suppository (1,000 mg total) rectally at bedtime. 30 suppository 12  . metoprolol tartrate (LOPRESSOR) 25 MG tablet TAKE 1/2 TABLET TWICE DAILY (SUBSTITUTED FOR  LOPRESSOR) 90 tablet 3  . Probiotic Product (PROBIOTIC DAILY PO) Take by mouth.     No current facility-administered medications on file prior to visit.   . The PMH, PSH, Social History, Family History, Medications, and allergies have been reviewed in [facility], and have been updated if relevant.  Review of Systems  Constitutional: Negative.   HENT: Negative.   Respiratory: Negative.   Cardiovascular: Negative.   Gastrointestinal: Negative.   Endocrine: Negative.   Genitourinary: Negative.   Musculoskeletal: Positive for back pain.  Skin: Negative.   Allergic/Immunologic: Negative.   Neurological: Negative.   Hematological: Negative.   Psychiatric/Behavioral: Positive for decreased concentration, dysphoric mood and sleep disturbance. Negative for suicidal ideas. The patient is nervous/anxious.   All other systems reviewed and are negative.   Wt Readings from Last 3 Encounters:  04/28/16 186 lb 4 oz (84.5 kg)  03/20/15 192 lb 12 oz (87.4 kg)  12/11/14 197 lb 4 oz (89.5 kg)     Physical Exam BP 136/82   Pulse 73   Temp 97.8 F (36.6 C) (Oral)   Ht 5' 4.75" (1.645 m)   Wt 186 lb 4 oz (84.5 kg)   SpO2 98%   BMI 31.23 kg/m  BP Readings from Last 3 Encounters:  04/28/16 136/82  03/20/15 132/76  12/11/14 124/76   Physical Exam  Constitutional: She is oriented to person, place, and time and well-developed, well-nourished, and in no distress. No distress.  HENT:  Head: Normocephalic and atraumatic.  Eyes: Conjunctivae are normal.  Neck: Normal range of motion.  Cardiovascular: Normal rate and regular rhythm.   Pulmonary/Chest: Effort normal and breath sounds normal. No respiratory distress. She has no wheezes. She has no rales. She exhibits no tenderness.  Abdominal: Soft.  Musculoskeletal: Normal range of  motion. She exhibits no edema.  Neurological: She is alert and oriented to person, place, and time. No cranial nerve deficit.  Skin: Skin is warm and dry. She is not diaphoretic.  Psychiatric: Mood, memory, affect and judgment normal.  Nursing note and vitals reviewed.

## 2016-04-28 NOTE — Assessment & Plan Note (Signed)
Likely due to grief. eRx sent for trazodone.

## 2016-04-28 NOTE — Progress Notes (Signed)
Pre visit review using our clinic review tool, if applicable. No additional management support is needed unless otherwise documented below in the visit note. 

## 2016-05-13 ENCOUNTER — Encounter: Payer: Self-pay | Admitting: Family Medicine

## 2016-06-11 ENCOUNTER — Encounter: Payer: Self-pay | Admitting: Family Medicine

## 2016-06-11 ENCOUNTER — Ambulatory Visit (INDEPENDENT_AMBULATORY_CARE_PROVIDER_SITE_OTHER): Payer: Commercial Managed Care - HMO | Admitting: Family Medicine

## 2016-06-11 VITALS — BP 134/82 | HR 72 | Temp 97.5°F | Wt 184.8 lb

## 2016-06-11 DIAGNOSIS — J069 Acute upper respiratory infection, unspecified: Secondary | ICD-10-CM

## 2016-06-11 MED ORDER — SPACER/AERO CHAMBER MOUTHPIECE MISC: 1.0000 [IU] | 0 refills | Status: DC | PRN

## 2016-06-11 MED ORDER — BENZONATATE 100 MG PO CAPS: 100.0000 mg | ORAL_CAPSULE | Freq: Three times a day (TID) | ORAL | 0 refills | Status: DC | PRN

## 2016-06-11 MED ORDER — PREDNISONE 20 MG PO TABS: 20.0000 mg | ORAL_TABLET | Freq: Every day | ORAL | 0 refills | Status: DC

## 2016-06-11 MED ORDER — ALBUTEROL SULFATE HFA 108 (90 BASE) MCG/ACT IN AERS: 2.0000 | INHALATION_SPRAY | RESPIRATORY_TRACT | 1 refills | Status: DC | PRN

## 2016-06-11 MED ORDER — AMOXICILLIN 875 MG PO TABS: 875.0000 mg | ORAL_TABLET | Freq: Two times a day (BID) | ORAL | 0 refills | Status: DC

## 2016-06-11 NOTE — Progress Notes (Signed)
Pre visit review using our clinic review tool, if applicable. No additional management support is needed unless otherwise documented below in the visit note. 

## 2016-06-11 NOTE — Progress Notes (Signed)
Subjective:    Patient ID: Brittney Pierce, female    DOB: [birth year], 65 y.o.   MRN: [id number]  HPI This is a 65 yo female who presents today with 10 days of chest congestion and cough, sinus pressure. Feels worn down. Yellow sputum production, little bloody nasal drainage. Felt feverish toward evening and has woken up in a sweat. Felt like she was getting better and then got worse. Has been taking nyquil and mucinex dm with a little relief. Hears wheezes intermittently. SOB with exertion.   Past Medical History:  Diagnosis Date  . Disorder of bone and cartilage, unspecified   . Dysuria   . Esophageal reflux   . Lumbago   . Osteoarthrosis, unspecified whether generalized or localized, unspecified site   . Ulcerative colitis, unspecified   . Unspecified essential hypertension    Past Surgical History:  Procedure Laterality Date  . CESAREAN SECTION     x2  . CHOLECYSTECTOMY    . [place] SURGERY  2008   Family History  Problem Relation Age of Onset  . Heart attack Father 51  . Diabetes Father   . Lung cancer Mother    Social History  Substance Use Topics  . Smoking status: Former Smoker    Quit date: 07/27/1980  . Smokeless tobacco: Never Used  . Alcohol use Yes     Comment: Once a month (maybe)      Review of Systems Per HPI    Objective:   Physical Exam  Constitutional: She is oriented to person, place, and time. She appears well-developed and well-nourished. No distress.  HENT:  Head: Normocephalic and atraumatic.  Right Ear: Tympanic membrane, external ear and ear canal normal.  Left Ear: Tympanic membrane, external ear and ear canal normal.  Nose: Mucosal edema and rhinorrhea present. Right sinus exhibits maxillary sinus tenderness. Right sinus exhibits no frontal sinus tenderness. Left sinus exhibits maxillary sinus tenderness. Left sinus exhibits no frontal sinus tenderness.  Mouth/Throat: Uvula is midline, oropharynx is clear and moist and mucous  membranes are normal.  Cardiovascular: Normal rate, regular rhythm and normal heart sounds.   Pulmonary/Chest: Effort normal. She has wheezes (few faint expiratory).  Neurological: She is alert and oriented to person, place, and time.  Skin: Skin is warm and dry. She is not diaphoretic.  Psychiatric: She has a normal mood and affect. Her behavior is normal. Judgment and thought content normal.  Vitals reviewed.     BP 134/82   Pulse 72   Temp 97.5 F (36.4 C) (Oral)   Wt 184 lb 12 oz (83.8 kg)   SpO2 97%   BMI 30.98 kg/m  Wt Readings from Last 3 Encounters:  06/11/16 184 lb 12 oz (83.8 kg)  04/28/16 186 lb 4 oz (84.5 kg)  03/20/15 192 lb 12 oz (87.4 kg)       Assessment & Plan:  1. Acute upper respiratory infection - amoxicillin (AMOXIL) 875 MG tablet; Take 1 tablet (875 mg total) by mouth 2 (two) times daily.  Dispense: 14 tablet; Refill: 0 - albuterol (PROVENTIL HFA;VENTOLIN HFA) 108 (90 Base) MCG/ACT inhaler; Inhale 2 puffs into the lungs every 4 (four) hours as needed for wheezing or shortness of breath (cough, shortness of breath or wheezing.).  Dispense: 1 Inhaler; Refill: 1 - benzonatate (TESSALON) 100 MG capsule; Take 1-2 capsules (100-200 mg total) by mouth 3 (three) times daily as needed for cough.  Dispense: 40 capsule; Refill: 0 - predniSONE (DELTASONE) 20 MG tablet; Take 1  tablet (20 mg total) by mouth daily with breakfast.  Dispense: 5 tablet; Refill: 0 - Spacer/Aero Chamber Mouthpiece MISC; 1 Units by Does not apply route as needed.  Dispense: 1 each; Refill: 0   Clarene Reamer, FNP-BC  [facility]  06/13/2016 8:28 AM

## 2016-06-11 NOTE — Patient Instructions (Signed)
Upper Respiratory Infection, Adult Most upper respiratory infections (URIs) are a viral infection of the air passages leading to the lungs. A URI affects the nose, throat, and upper air passages. The most common type of URI is nasopharyngitis and is typically referred to as "the common cold." URIs run their course and usually go away on their own. Most of the time, a URI does not require medical attention, but sometimes a bacterial infection in the upper airways can follow a viral infection. This is called a secondary infection. Sinus and middle ear infections are common types of secondary upper respiratory infections. Bacterial pneumonia can also complicate a URI. A URI can worsen asthma and chronic obstructive pulmonary disease (COPD). Sometimes, these complications can require emergency medical care and may be life threatening. What are the causes? Almost all URIs are caused by viruses. A virus is a type of germ and can spread from one person to another. What increases the risk? You may be at risk for a URI if:  You smoke.  You have chronic heart or lung disease.  You have a weakened defense (immune) system.  You are very young or very old.  You have nasal allergies or asthma.  You work in crowded or poorly ventilated areas.  You work in health care facilities or schools.  What are the signs or symptoms? Symptoms typically develop 2-3 days after you come in contact with a cold virus. Most viral URIs last 7-10 days. However, viral URIs from the influenza virus (flu virus) can last 14-18 days and are typically more severe. Symptoms may include:  Runny or stuffy (congested) nose.  Sneezing.  Cough.  Sore throat.  Headache.  Fatigue.  Fever.  Loss of appetite.  Pain in your forehead, behind your eyes, and over your cheekbones (sinus pain).  Muscle aches.  How is this diagnosed? Your health care provider may diagnose a URI by:  Physical exam.  Tests to check that your  symptoms are not due to another condition such as: ? Strep throat. ? Sinusitis. ? Pneumonia. ? Asthma.  How is this treated? A URI goes away on its own with time. It cannot be cured with medicines, but medicines may be prescribed or recommended to relieve symptoms. Medicines may help:  Reduce your fever.  Reduce your cough.  Relieve nasal congestion.  Follow these instructions at home:  Take medicines only as directed by your health care provider.  Gargle warm saltwater or take cough drops to comfort your throat as directed by your health care provider.  Use a warm mist humidifier or inhale steam from a shower to increase air moisture. This may make it easier to breathe.  Drink enough fluid to keep your urine clear or pale yellow.  Eat soups and other clear broths and maintain good nutrition.  Rest as needed.  Return to work when your temperature has returned to normal or as your health care provider advises. You may need to stay home longer to avoid infecting others. You can also use a face mask and careful hand washing to prevent spread of the virus.  Increase the usage of your inhaler if you have asthma.  Do not use any tobacco products, including cigarettes, chewing tobacco, or electronic cigarettes. If you need help quitting, ask your health care provider. How is this prevented? The best way to protect yourself from getting a cold is to practice good hygiene.  Avoid oral or hand contact with people with cold symptoms.  Wash your   hands often if contact occurs.  There is no clear evidence that vitamin C, vitamin E, echinacea, or exercise reduces the chance of developing a cold. However, it is always recommended to get plenty of rest, exercise, and practice good nutrition. Contact a health care provider if:  You are getting worse rather than better.  Your symptoms are not controlled by medicine.  You have chills.  You have worsening shortness of breath.  You have  brown or red mucus.  You have yellow or brown nasal discharge.  You have pain in your face, especially when you bend forward.  You have a fever.  You have swollen neck glands.  You have pain while swallowing.  You have white areas in the back of your throat. Get help right away if:  You have severe or persistent: ? Headache. ? Ear pain. ? Sinus pain. ? Chest pain.  You have chronic lung disease and any of the following: ? Wheezing. ? Prolonged cough. ? Coughing up blood. ? A change in your usual mucus.  You have a stiff neck.  You have changes in your: ? Vision. ? Hearing. ? Thinking. ? Mood. This information is not intended to replace advice given to you by your health care provider. Make sure you discuss any questions you have with your health care provider. Document Released: 01/06/2001 Document Revised: 03/15/2016 Document Reviewed: 10/18/2013 Elsevier Interactive Patient Education  2017 Elsevier Inc.  

## 2016-08-14 IMAGING — CR DG LUMBAR SPINE COMPLETE 4+V
5 series · 5 of 5 positions shown · non-contrast
Comparison: None.

CLINICAL DATA: Low back pain without sciatica. Unspecified back
pain laterality.

EXAM:
LUMBAR SPINE - COMPLETE 4+ VIEW

[view not recorded (1 of 5)]
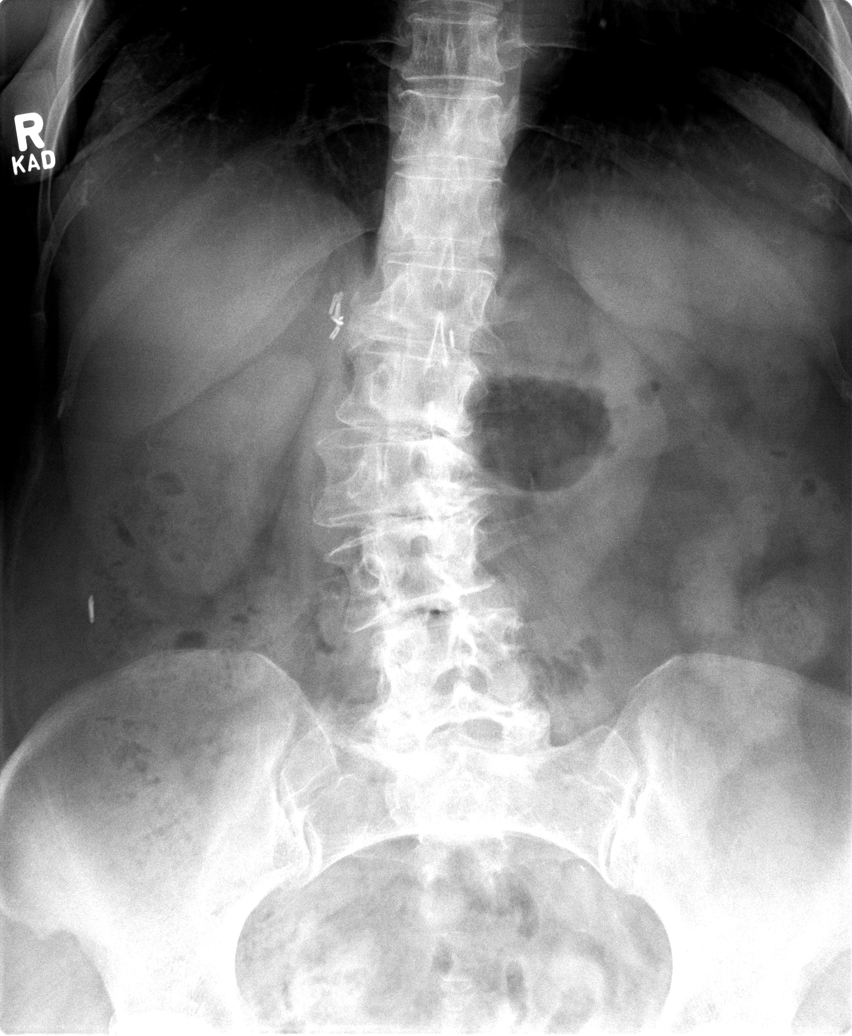

[view not recorded (2 of 5)]
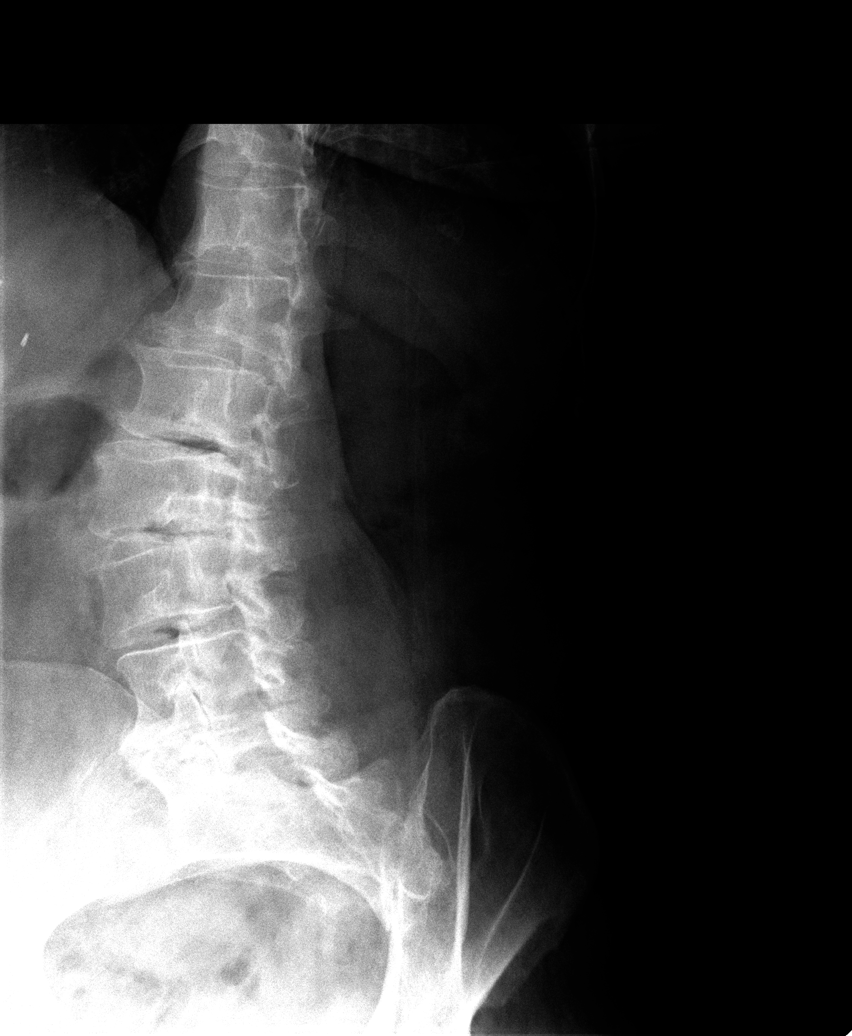

[view not recorded (3 of 5)]
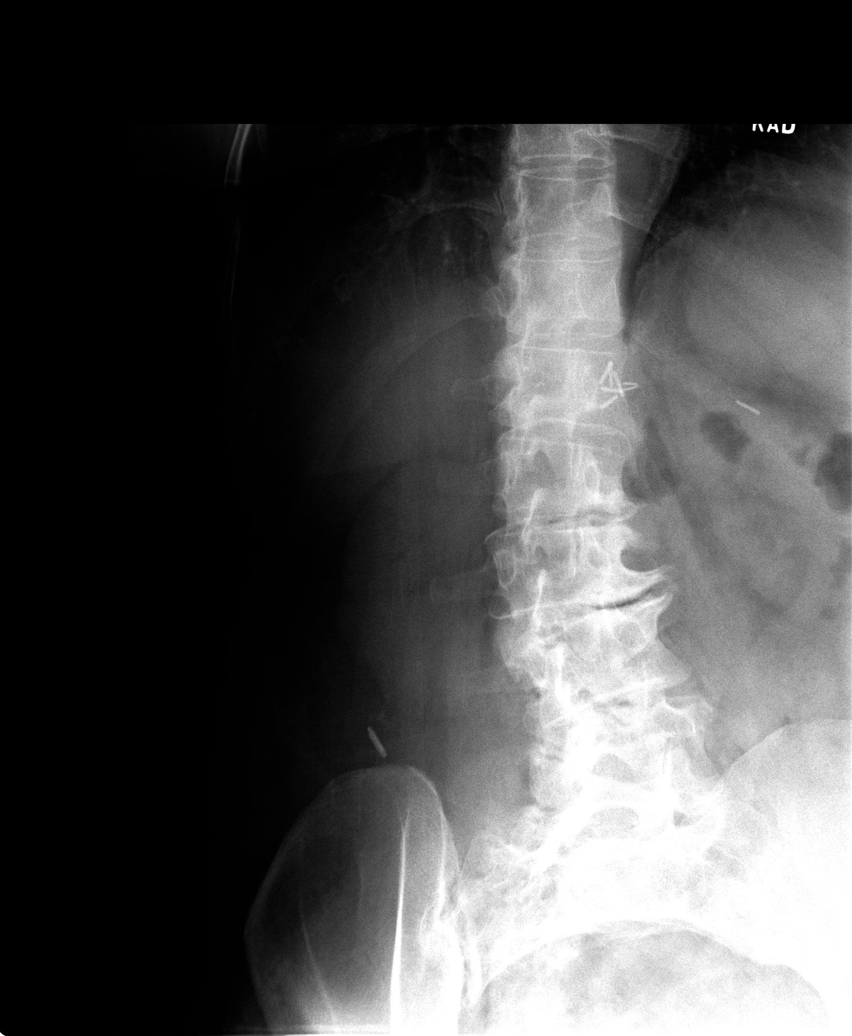

[view not recorded (4 of 5)]
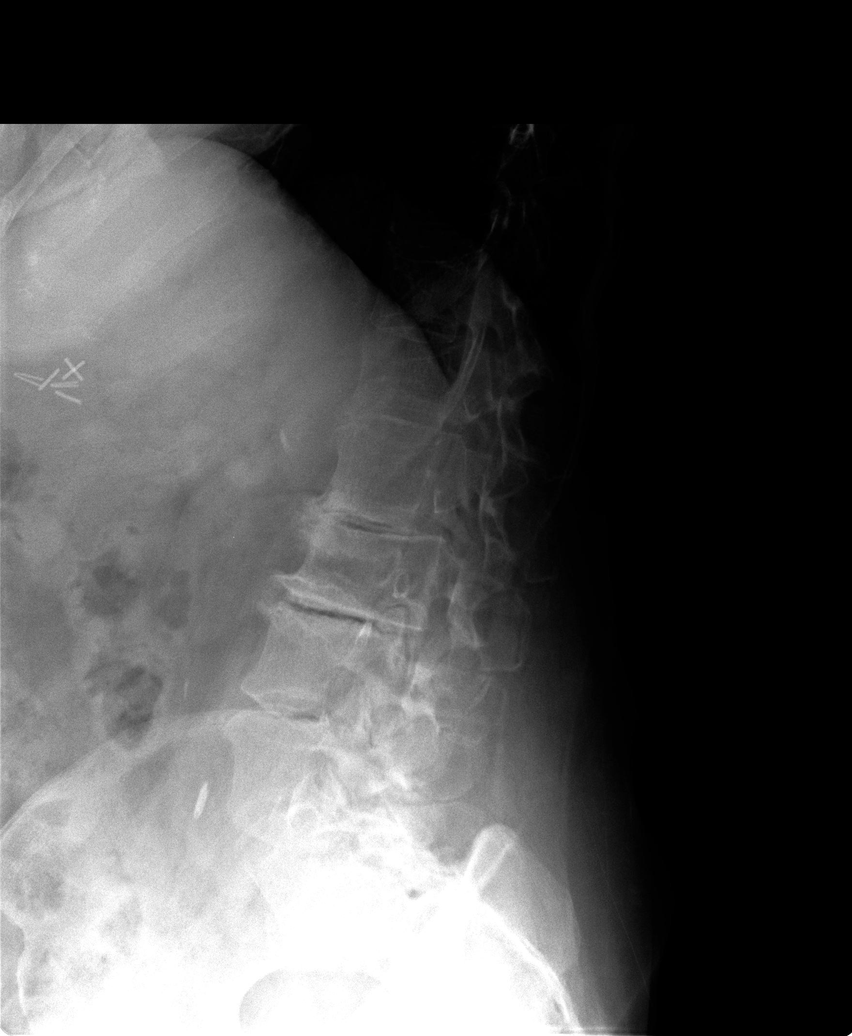

[view not recorded (5 of 5)]
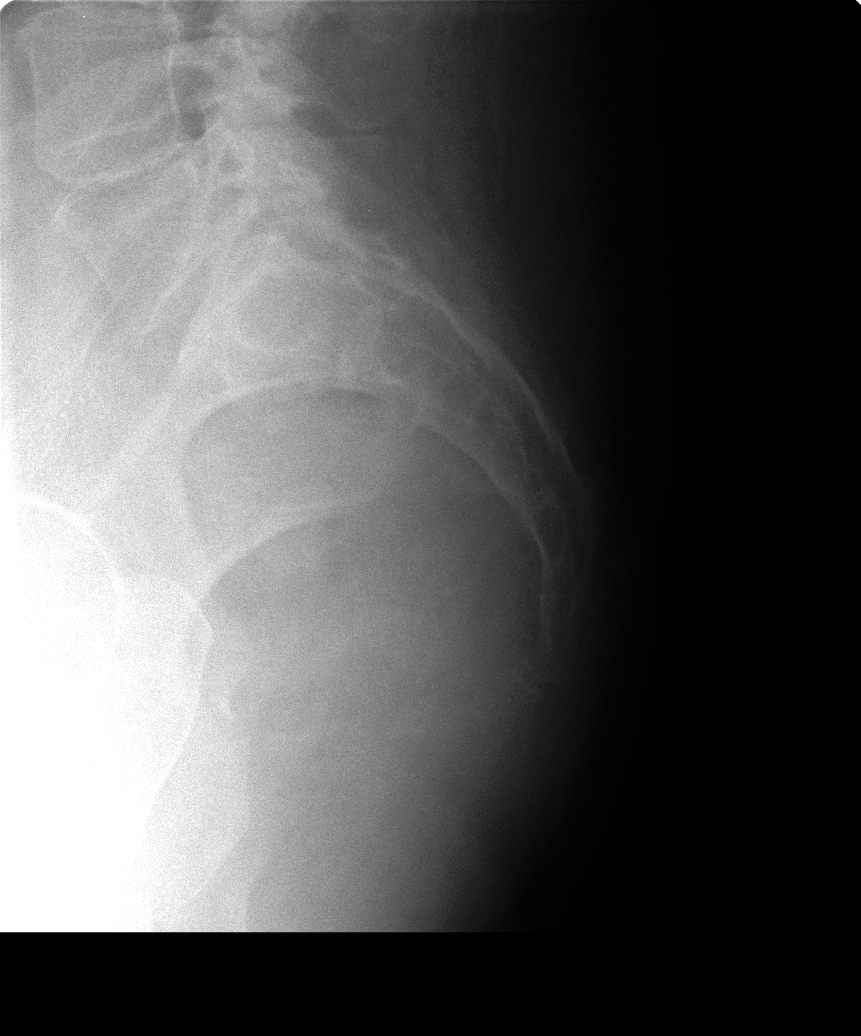

[5 of 5 positions shown; findings below may reference images not displayed]

FINDINGS: Dextroscoliosis of the lumbar spine with the apex at L3. The Cobb
angle between the top of L2 and the bottom of L4 measures 23
degrees. Surgical clips in the right abdomen. There is marked disc
space narrowing along the left side of L3-L4 related to the
scoliosis. No evidence for a pars defect. Prominent endplate
degenerative changes involving L2, L3 and L4. Vertebral body heights
are maintained. Disc space narrowing at L2-L3 and L3-L4.
IMPRESSION: Scoliosis with multilevel degenerative disease. No acute bone
abnormality.

## 2016-08-18 DIAGNOSIS — Z79899 Other long term (current) drug therapy: Secondary | ICD-10-CM | POA: Diagnosis not present

## 2016-08-18 DIAGNOSIS — Z79891 Long term (current) use of opiate analgesic: Secondary | ICD-10-CM | POA: Diagnosis not present

## 2016-08-18 DIAGNOSIS — M5137 Other intervertebral disc degeneration, lumbosacral region: Secondary | ICD-10-CM | POA: Diagnosis not present

## 2016-08-18 DIAGNOSIS — M545 Low back pain: Secondary | ICD-10-CM | POA: Diagnosis not present

## 2016-08-18 DIAGNOSIS — M47817 Spondylosis without myelopathy or radiculopathy, lumbosacral region: Secondary | ICD-10-CM | POA: Diagnosis not present

## 2016-08-18 DIAGNOSIS — G894 Chronic pain syndrome: Secondary | ICD-10-CM | POA: Diagnosis not present

## 2016-08-21 DIAGNOSIS — M47817 Spondylosis without myelopathy or radiculopathy, lumbosacral region: Secondary | ICD-10-CM | POA: Diagnosis not present

## 2016-08-25 DIAGNOSIS — L814 Other melanin hyperpigmentation: Secondary | ICD-10-CM | POA: Diagnosis not present

## 2016-08-25 DIAGNOSIS — L821 Other seborrheic keratosis: Secondary | ICD-10-CM | POA: Diagnosis not present

## 2016-08-25 DIAGNOSIS — Z85828 Personal history of other malignant neoplasm of skin: Secondary | ICD-10-CM | POA: Diagnosis not present

## 2016-08-25 DIAGNOSIS — L570 Actinic keratosis: Secondary | ICD-10-CM | POA: Diagnosis not present

## 2016-09-15 DIAGNOSIS — M545 Low back pain: Secondary | ICD-10-CM | POA: Diagnosis not present

## 2016-09-15 DIAGNOSIS — G894 Chronic pain syndrome: Secondary | ICD-10-CM | POA: Diagnosis not present

## 2016-09-15 DIAGNOSIS — Z79891 Long term (current) use of opiate analgesic: Secondary | ICD-10-CM | POA: Diagnosis not present

## 2016-09-15 DIAGNOSIS — M47816 Spondylosis without myelopathy or radiculopathy, lumbar region: Secondary | ICD-10-CM | POA: Diagnosis not present

## 2016-09-15 DIAGNOSIS — Z79899 Other long term (current) drug therapy: Secondary | ICD-10-CM | POA: Diagnosis not present

## 2016-09-29 DIAGNOSIS — G894 Chronic pain syndrome: Secondary | ICD-10-CM | POA: Diagnosis not present

## 2016-09-29 DIAGNOSIS — M533 Sacrococcygeal disorders, not elsewhere classified: Secondary | ICD-10-CM | POA: Diagnosis not present

## 2016-09-29 DIAGNOSIS — Z79891 Long term (current) use of opiate analgesic: Secondary | ICD-10-CM | POA: Diagnosis not present

## 2016-09-29 DIAGNOSIS — Z79899 Other long term (current) drug therapy: Secondary | ICD-10-CM | POA: Diagnosis not present

## 2016-10-06 DIAGNOSIS — H52213 Irregular astigmatism, bilateral: Secondary | ICD-10-CM | POA: Diagnosis not present

## 2016-10-06 DIAGNOSIS — H2513 Age-related nuclear cataract, bilateral: Secondary | ICD-10-CM | POA: Diagnosis not present

## 2016-10-06 DIAGNOSIS — H524 Presbyopia: Secondary | ICD-10-CM | POA: Diagnosis not present

## 2016-10-11 DIAGNOSIS — N390 Urinary tract infection, site not specified: Secondary | ICD-10-CM | POA: Diagnosis not present

## 2016-10-13 DIAGNOSIS — Z79899 Other long term (current) drug therapy: Secondary | ICD-10-CM | POA: Diagnosis not present

## 2016-10-13 DIAGNOSIS — G894 Chronic pain syndrome: Secondary | ICD-10-CM | POA: Diagnosis not present

## 2016-10-13 DIAGNOSIS — Z79891 Long term (current) use of opiate analgesic: Secondary | ICD-10-CM | POA: Diagnosis not present

## 2016-10-13 DIAGNOSIS — M47816 Spondylosis without myelopathy or radiculopathy, lumbar region: Secondary | ICD-10-CM | POA: Diagnosis not present

## 2016-10-13 DIAGNOSIS — M545 Low back pain: Secondary | ICD-10-CM | POA: Diagnosis not present

## 2016-10-22 ENCOUNTER — Ambulatory Visit (INDEPENDENT_AMBULATORY_CARE_PROVIDER_SITE_OTHER): Payer: Medicare HMO | Admitting: Family Medicine

## 2016-10-22 ENCOUNTER — Encounter: Payer: Self-pay | Admitting: Family Medicine

## 2016-10-22 VITALS — BP 128/88 | HR 86 | Temp 98.2°F | Ht 64.75 in | Wt 182.0 lb

## 2016-10-22 DIAGNOSIS — R300 Dysuria: Secondary | ICD-10-CM

## 2016-10-22 DIAGNOSIS — N3090 Cystitis, unspecified without hematuria: Secondary | ICD-10-CM | POA: Diagnosis not present

## 2016-10-22 LAB — POC URINALSYSI DIPSTICK (AUTOMATED)
Glucose, UA: NEGATIVE
KETONES UA: NEGATIVE
NITRITE UA: POSITIVE
PH UA: 6 (ref 5.0–8.0)
Protein, UA: NEGATIVE
Spec Grav, UA: 1.02 (ref 1.030–1.035)
Urobilinogen, UA: 2 — AB (ref ?–2.0)

## 2016-10-22 MED ORDER — OMEPRAZOLE 40 MG PO CPDR: 40.0000 mg | DELAYED_RELEASE_CAPSULE | Freq: Every day | ORAL | 3 refills | Status: DC

## 2016-10-22 MED ORDER — SULFAMETHOXAZOLE-TRIMETHOPRIM 800-160 MG PO TABS
1.0000 | ORAL_TABLET | Freq: Two times a day (BID) | ORAL | 0 refills | Status: AC
Start: 2016-10-22 — End: 2016-11-01

## 2016-10-22 NOTE — Progress Notes (Signed)
Pre visit review using our clinic review tool, if applicable. No additional management support is needed unless otherwise documented below in the visit note. 

## 2016-10-23 NOTE — Progress Notes (Signed)
Dr. Frederico Hamman T. Kamaya Keckler, MD, [facility] [place], [address] Phone: [phone] Fax: [phone]  10/22/2016  Patient: Brittney Pierce, MRN: [id number], DOB: 10-15-1950, 66 y.o.  Primary Physician:  Arnette Norris, MD   Chief Complaint  Patient presents with  . Dysuria  . Urinary Frequency  . Night Sweats   Subjective:   This 66 y.o. female patient presents with burning, urgency. No vaginal discharge or external irritation.  No STD exposure. No abd pain, no flank pain.  Last week she went to urgent care center in [place], and they placed her on some Macrobid.  She continues to have symptoms.  The PMH, PSH, Social History, Family History, Medications, and allergies have been reviewed in [facility], and have been updated if relevant.  Patient Active Problem List   Diagnosis Date Noted  . Grief 04/28/2016  . Insomnia 04/28/2016  . Medicare annual wellness visit, subsequent 03/20/2015  . Fear of travel with panic attacks 12/11/2014  . Hyperglycemia 02/24/2011  . Essential hypertension 01/03/2010  . GERD 01/03/2010  . Ulcerative colitis ([place]) 01/03/2010  . Osteoarthritis 01/03/2010  . BACK PAIN, LUMBAR, CHRONIC 01/03/2010  . OSTEOPENIA 01/03/2010    Past Medical History:  Diagnosis Date  . Disorder of bone and cartilage, unspecified   . Dysuria   . Esophageal reflux   . Lumbago   . Osteoarthrosis, unspecified whether generalized or localized, unspecified site   . Ulcerative colitis, unspecified   . Unspecified essential hypertension     Past Surgical History:  Procedure Laterality Date  . CESAREAN SECTION     x2  . CHOLECYSTECTOMY    . LUMBAR [place] SURGERY  2008    Social History   Social History  . Marital status: Married    Spouse name: N/A  . Number of children: N/A  . Years of education: N/A   Occupational History  . Retired    Social History Main Topics  . Smoking status: Former Smoker    Quit date: 07/27/1980  . Smokeless tobacco: Never Used  . Alcohol use Yes     Comment: Once a month (maybe)  . Drug use: No  . Sexual activity: Not on file   Other Topics Concern  . Not on file   Social History Narrative   Regular exercise-yes      Moved here from [facility] to be closer to grandchildren      Likes to garden      Does have a living will.  Husband is HPOA.   Would desire life support, no prolonged life support if futile.             Family History  Problem Relation Age of Onset  . Heart attack Father 53  . Diabetes Father   . Lung cancer Mother     Allergies  Allergen Reactions  . Cephalexin   . Codeine     REACTION: stomach upset  . Doxycycline     GI side effects  . Erythromycin     REACTION: stomach upset    Medication list reviewed and updated in full in [facility].  GEN:  no fevers, chills. GI: No n/v/d, eating normally Otherwise, ROS is as per the HPI.  Objective:   Blood pressure 128/88, pulse 86, temperature 98.2 F (36.8 C), temperature source Oral, height 5' 4.75" (1.645 m), weight 182 lb (82.6 kg).  GEN: WDWN, A&Ox4,NAD. Non-toxic HEENT: Atraumatc, normocephalic. CV: RRR, No M/G/R  PULM: CTA B, No wheezes, crackles, or rhonchi ABD: S, NT, ND, +BS, no rebound. No CVAT. mild suprapubic tenderness. EXT: No c/c/e  Objective Data: Results for orders placed or performed in visit on 10/22/16  POCT Urinalysis Dipstick (Automated)  Result Value Ref Range   Color, UA orange    Clarity, UA clear    Glucose, UA negative    Bilirubin, UA 1+    Ketones, UA negative    Spec Grav, UA 1.020 1.030 - 1.035   Blood, UA moderate (2+)    pH, UA 6.0 5.0 - 8.0   Protein, UA negative    Urobilinogen, UA 2.0 (A) Negative - 2.0   Nitrite, UA positive    Leukocytes, UA large (3+) (A) Negative    Assessment and Plan:   Cystitis  Dysuria - Plan: POCT Urinalysis Dipstick (Automated), Urine culture  Rx with ABX as below. Drink plenty of  fluids and supportive care.  Follow-up: No Follow-up on file.  New Prescriptions   SULFAMETHOXAZOLE-TRIMETHOPRIM (BACTRIM DS,SEPTRA DS) 800-160 MG TABLET    Take 1 tablet by mouth 2 (two) times daily.   Modified Medications   Modified Medication Previous Medication   OMEPRAZOLE (PRILOSEC) 40 MG CAPSULE omeprazole (PRILOSEC) 40 MG capsule      Take 1 capsule (40 mg total) by mouth daily.    Take 1 capsule (40 mg total) by mouth daily.   Orders Placed This Encounter  Procedures  . Urine culture  . POCT Urinalysis Dipstick (Automated)    Signed,  Neko Mcgeehan T. Hussain Maimone, MD   Patient's Medications  New Prescriptions   SULFAMETHOXAZOLE-TRIMETHOPRIM (BACTRIM DS,SEPTRA DS) 800-160 MG TABLET    Take 1 tablet by mouth 2 (two) times daily.  Previous Medications   ALBUTEROL (PROVENTIL HFA;VENTOLIN HFA) 108 (90 BASE) MCG/ACT INHALER    Inhale 2 puffs into the lungs every 4 (four) hours as needed for wheezing or shortness of breath (cough, shortness of breath or wheezing.).   CALCIUM 1200-1000 MG-UNIT CHEW    Chew 1 tablet by mouth daily.     CELECOXIB (CELEBREX) 200 MG CAPSULE    Take 200 mg by mouth daily.    CETIRIZINE (ZYRTEC) 10 MG TABLET    Take 10 mg by mouth daily.     FLUTICASONE (FLONASE) 50 MCG/ACT NASAL SPRAY    2 sprays each nostril as directed.   HYDROCORTISONE (ANUSOL-HC) 25 MG SUPPOSITORY    Place 1 suppository (25 mg total) rectally 2 (two) times daily as needed.   MESALAMINE (CANASA) 1000 MG SUPPOSITORY    Place 1,000 mg rectally at bedtime as needed.   METOPROLOL TARTRATE (LOPRESSOR) 25 MG TABLET    TAKE 1/2 TABLET TWICE DAILY (SUBSTITUTED FOR  LOPRESSOR)   PROBIOTIC PRODUCT (PROBIOTIC DAILY PO)    Take by mouth.  Modified Medications   Modified Medication Previous Medication   OMEPRAZOLE (PRILOSEC) 40 MG CAPSULE omeprazole (PRILOSEC) 40 MG capsule      Take 1 capsule (40 mg total) by mouth daily.    Take 1 capsule (40 mg total) by mouth daily.  Discontinued Medications    ALPRAZOLAM (XANAX) 0.25 MG TABLET    Take 1 tablet (0.25 mg total) by mouth 2 (two) times daily as needed for anxiety.   AMOXICILLIN (AMOXIL) 875 MG TABLET    Take 1 tablet (875 mg total) by mouth 2 (two) times daily.   BENZONATATE (TESSALON) 100 MG CAPSULE    Take 1-2 capsules (100-200 mg total) by mouth 3 (three) times daily  as needed for cough.   MELOXICAM (MOBIC) 15 MG TABLET    TAKE 1 TABLET EVERY DAY   MESALAMINE (CANASA) 1000 MG SUPPOSITORY    Place 1 suppository (1,000 mg total) rectally at bedtime.   PREDNISONE (DELTASONE) 20 MG TABLET    Take 1 tablet (20 mg total) by mouth daily with breakfast.   SPACER/AERO CHAMBER MOUTHPIECE MISC    1 Units by Does not apply route as needed.   TRAZODONE (DESYREL) 50 MG TABLET    Take 0.5-1 tablets (25-50 mg total) by mouth at bedtime as needed for sleep.

## 2016-10-24 LAB — URINE CULTURE

## 2016-10-28 DIAGNOSIS — M533 Sacrococcygeal disorders, not elsewhere classified: Secondary | ICD-10-CM | POA: Diagnosis not present

## 2016-11-30 ENCOUNTER — Encounter: Payer: Self-pay | Admitting: Family Medicine

## 2016-11-30 ENCOUNTER — Ambulatory Visit (INDEPENDENT_AMBULATORY_CARE_PROVIDER_SITE_OTHER): Payer: Medicare HMO | Admitting: Family Medicine

## 2016-11-30 ENCOUNTER — Telehealth: Payer: Self-pay | Admitting: *Deleted

## 2016-11-30 VITALS — BP 134/84 | HR 69 | Temp 97.5°F | Wt 183.0 lb

## 2016-11-30 DIAGNOSIS — N390 Urinary tract infection, site not specified: Secondary | ICD-10-CM

## 2016-11-30 MED ORDER — CIPROFLOXACIN HCL 250 MG PO TABS: 250.0000 mg | ORAL_TABLET | Freq: Two times a day (BID) | ORAL | 0 refills | Status: DC

## 2016-11-30 NOTE — Patient Instructions (Signed)
Great to see you.  Please take cipro as directed- 1 tablet twice daily for 5 daily.   Pyelonephritis, Adult Pyelonephritis is a kidney infection. The kidneys are organs that help clean your blood by moving waste out of your blood and into your pee (urine). This infection can happen quickly, or it can last for a long time. In most cases, it clears up with treatment and does not cause other problems. Follow these instructions at home: Medicines   Take over-the-counter and prescription medicines only as told by your doctor.  Take your antibiotic medicine as told by your doctor. Do not stop taking the medicine even if you start to feel better. General instructions   Drink enough fluid to keep your pee clear or pale yellow.  Avoid caffeine, tea, and carbonated drinks.  Pee (urinate) often. Avoid holding in pee for long periods of time.  Pee before and after sex.  After pooping (having a bowel movement), women should wipe from front to back. Use each tissue only once.  Keep all follow-up visits as told by your doctor. This is important. Contact a doctor if:  You do not feel better after 2 days.  Your symptoms get worse.  You have a fever. Get help right away if:  You cannot take your medicine or drink fluids as told.  You have chills and shaking.  You throw up (vomit).  You have very bad pain in your side (flank) or back.  You feel very weak or you pass out (faint). This information is not intended to replace advice given to you by your health care provider. Make sure you discuss any questions you have with your health care provider. Document Released: 08/20/2004 Document Revised: 12/19/2015 Document Reviewed: 11/05/2014 Elsevier Interactive Patient Education  2017 [employer].

## 2016-11-30 NOTE — Progress Notes (Signed)
Pre visit review using our clinic review tool, if applicable. No additional management support is needed unless otherwise documented below in the visit note. 

## 2016-11-30 NOTE — Telephone Encounter (Signed)
Pt seen 11/30/16    [place] Night - Client [facility] Patient Name: Brittney Pierce ER Gender: Female DOB: 1951-06-14 Age: 66 Y 10 M 14 D Return Phone Number: [phone] (Primary) City/State/Zip: [place] Client [place] Night - Client Client Site [place] - Night Physician Arnette Norris - MD Who Is Calling Patient / Member / Family / Caregiver Call Type Triage / Clinical Relationship To Patient Self Return Phone Number [phone] (Primary) Chief Complaint Urination Pain Reason for Call Symptomatic / Request for [place] thinks she has another UTI, asking for rx. She has taken Sulfamethoxazole Nurse Assessment Nurse: Vivia Birmingham, RN, Chrissy Date/Time Eilene Ghazi Time): 11/29/2016 2:04:40 PM Confirm and document reason for call. If symptomatic, describe symptoms. ---Caller states that she thinks she has another "UTI", burning with urination, frequency, denies any blood, night sweats, no fever Does the PT have any chronic conditions? (i.e. diabetes, asthma, etc.) ---Yes List chronic conditions. ---arthritis Guidelines Guideline Title Affirmed Question Urination Pain - Female Age > 67 years Disp. Time Eilene Ghazi Time) Disposition Final User 11/29/2016 2:14:15 PM See Physician within 24 Hours Yes Vivia Birmingham, RN, Chrissy Referrals REFERRED TO PCP OFFICE Care Advice Given Per Guideline SEE PHYSICIAN WITHIN 24 HOURS: * IF OFFICE WILL BE OPEN: You need to be seen within the next 24 hours. Call your doctor when the office opens, and make an appointment. REASSURANCE: This could be an urinary tract infection. You should see your PCP to be examined and tested. FLUIDS: Drink extra fluids. Drink 8-10 glasses of liquids a day (Reason: to produce a dilute, non-irritating urine). CARE ADVICE given per Urination Pain - Female (Adult) guideline. CALL BACK IF:  * Fever or back pain

## 2016-11-30 NOTE — Progress Notes (Signed)
SUBJECTIVE: Brittney Pierce is a 66 y.o. female who complains of urinary frequency, urgency and dysuria x 2 days, without flank pain, fever, chills, or abnormal vaginal discharge or bleeding.    Had UTI in 09/2016.   Urine cx reviewed. E coli-pan sensitive.   Was treated with Bactrim.    Current Outpatient Prescriptions on File Prior to Visit  Medication Sig Dispense Refill  . Calcium 1200-1000 MG-UNIT CHEW Chew 1 tablet by mouth daily.      . celecoxib (CELEBREX) 200 MG capsule Take 200 mg by mouth daily.     . cetirizine (ZYRTEC) 10 MG tablet Take 10 mg by mouth daily.      . fluticasone (FLONASE) 50 MCG/ACT nasal spray 2 sprays each nostril as directed. 48 g 1  . hydrocortisone (ANUSOL-HC) 25 MG suppository Place 1 suppository (25 mg total) rectally 2 (two) times daily as needed. 12 suppository 3  . mesalamine (CANASA) 1000 MG suppository Place 1,000 mg rectally at bedtime as needed.    . metoprolol tartrate (LOPRESSOR) 25 MG tablet TAKE 1/2 TABLET TWICE DAILY (SUBSTITUTED FOR  LOPRESSOR) 90 tablet 3  . omeprazole (PRILOSEC) 40 MG capsule Take 1 capsule (40 mg total) by mouth daily. 90 capsule 3  . Probiotic Product (PROBIOTIC DAILY PO) Take by mouth.     No current facility-administered medications on file prior to visit.     Allergies  Allergen Reactions  . Cephalexin   . Codeine     REACTION: stomach upset  . Doxycycline     GI side effects  . Erythromycin     REACTION: stomach upset    Past Medical History:  Diagnosis Date  . Disorder of bone and cartilage, unspecified   . Dysuria   . Esophageal reflux   . Lumbago   . Osteoarthrosis, unspecified whether generalized or localized, unspecified site   . Ulcerative colitis, unspecified   . Unspecified essential hypertension     Past Surgical History:  Procedure Laterality Date  . CESAREAN SECTION     x2  . CHOLECYSTECTOMY    . [place] SURGERY  2008    Family History  Problem Relation Age of Onset  .  Heart attack Father 68  . Diabetes Father   . Lung cancer Mother     Social History   Social History  . Marital status: Married    Spouse name: N/A  . Number of children: N/A  . Years of education: N/A   Occupational History  . Retired    Social History Main Topics  . Smoking status: Former Smoker    Quit date: 07/27/1980  . Smokeless tobacco: Never Used  . Alcohol use Yes     Comment: Once a month (maybe)  . Drug use: No  . Sexual activity: Not on file   Other Topics Concern  . Not on file   Social History Narrative   Regular exercise-yes      Moved here from [place] to be closer to grandchildren      Likes to garden      Does have a living will.  Husband is HPOA.   Would desire life support, no prolonged life support if futile.            The PMH, PSH, Social History, Family History, Medications, and allergies have been reviewed in [facility], and have been updated if relevant.  OBJECTIVE: BP 134/84 (BP Location: Left Arm, Patient Position: Sitting, Cuff Size: Large)   Pulse 69  Temp 97.5 F (36.4 C) (Oral)   Wt 183 lb (83 kg)   SpO2 96%   BMI 30.69 kg/m    Appears well, in no apparent distress.  Vital signs are normal. The abdomen is soft without tenderness, guarding, mass, rebound or organomegaly. No CVA tenderness or inguinal adenopathy noted. Urine dipstick shows Unable to read due to pt using uribel  ASSESSMENT: UTI uncomplicated without evidence of pyelonephritis  PLAN: Treatment per orders - cipro 250 mg twice daily x 5 days, also push fluids, may use Pyridium OTC prn. Call or return to clinic prn if these symptoms worsen or fail to improve as anticipated.

## 2016-12-11 ENCOUNTER — Telehealth: Payer: Self-pay

## 2016-12-11 NOTE — Telephone Encounter (Signed)
Last AWV was in October  Patient is on the list for [employer] 2018 and may be a good candidate for an AWV. Please let me know if/when appt is scheduled.

## 2016-12-12 NOTE — Telephone Encounter (Signed)
Pt home phone disconnected. LVM on pt son phone, Catalina Antigua, to call back and schedule AWV with Katha Cabal and CPE with PCP after 04/28/17

## 2016-12-14 ENCOUNTER — Other Ambulatory Visit: Payer: Self-pay

## 2016-12-14 ENCOUNTER — Telehealth: Payer: Self-pay | Admitting: Family Medicine

## 2016-12-14 MED ORDER — METOPROLOL TARTRATE 25 MG PO TABS: ORAL_TABLET | ORAL | 1 refills | Status: DC

## 2016-12-14 NOTE — Telephone Encounter (Signed)
Pt scheduled 05/04/17 for AWV

## 2016-12-14 NOTE — Telephone Encounter (Signed)
I spoke with pt and she wanted metoprolol tartrate refill to go to [facility] home delivery. Pt has annual wellness 05/11/17; last annual 04/28/16. Refilled per protocol. Pt voiced understanding.

## 2016-12-14 NOTE — Telephone Encounter (Signed)
Error note already opened.

## 2016-12-14 NOTE — Telephone Encounter (Signed)
Spoke to pt. She request metoprolol tartrate (LOPRESSOR) 25 MG tablet   [employer] mail order x 3 months at a time.   Rena-I could not remember who to route this too. Please advise

## 2017-01-01 DIAGNOSIS — G894 Chronic pain syndrome: Secondary | ICD-10-CM | POA: Diagnosis not present

## 2017-01-01 DIAGNOSIS — Z79891 Long term (current) use of opiate analgesic: Secondary | ICD-10-CM | POA: Diagnosis not present

## 2017-01-01 DIAGNOSIS — M47816 Spondylosis without myelopathy or radiculopathy, lumbar region: Secondary | ICD-10-CM | POA: Diagnosis not present

## 2017-01-01 DIAGNOSIS — M545 Low back pain: Secondary | ICD-10-CM | POA: Diagnosis not present

## 2017-01-01 DIAGNOSIS — Z79899 Other long term (current) drug therapy: Secondary | ICD-10-CM | POA: Diagnosis not present

## 2017-01-06 ENCOUNTER — Other Ambulatory Visit: Payer: Self-pay | Admitting: Physician Assistant

## 2017-01-06 ENCOUNTER — Ambulatory Visit
Admission: RE | Admit: 2017-01-06 | Discharge: 2017-01-06 | Disposition: A | Discharge status: Home / Self Care | Payer: Medicare HMO | Source: Ambulatory Visit | Attending: Physician Assistant | Admitting: Physician Assistant

## 2017-01-06 DIAGNOSIS — M47816 Spondylosis without myelopathy or radiculopathy, lumbar region: Secondary | ICD-10-CM

## 2017-01-06 DIAGNOSIS — G894 Chronic pain syndrome: Secondary | ICD-10-CM

## 2017-01-06 DIAGNOSIS — M545 Low back pain: Secondary | ICD-10-CM

## 2017-01-21 DIAGNOSIS — M533 Sacrococcygeal disorders, not elsewhere classified: Secondary | ICD-10-CM | POA: Diagnosis not present

## 2017-01-29 DIAGNOSIS — M419 Scoliosis, unspecified: Secondary | ICD-10-CM | POA: Diagnosis not present

## 2017-01-29 DIAGNOSIS — G894 Chronic pain syndrome: Secondary | ICD-10-CM | POA: Diagnosis not present

## 2017-01-29 DIAGNOSIS — M47816 Spondylosis without myelopathy or radiculopathy, lumbar region: Secondary | ICD-10-CM | POA: Diagnosis not present

## 2017-01-29 DIAGNOSIS — M545 Low back pain: Secondary | ICD-10-CM | POA: Diagnosis not present

## 2017-03-12 ENCOUNTER — Other Ambulatory Visit: Payer: Self-pay | Admitting: Family Medicine

## 2017-03-25 ENCOUNTER — Other Ambulatory Visit: Payer: Self-pay

## 2017-03-25 MED ORDER — TRAZODONE HCL 50 MG PO TABS: 25.0000 mg | ORAL_TABLET | Freq: Every evening | ORAL | 0 refills | Status: DC | PRN

## 2017-03-25 NOTE — Telephone Encounter (Signed)
Received fax from pt's pharmacy for a refill request, I don't see rx on current med list. Do you want to fill it?

## 2017-03-30 DIAGNOSIS — B372 Candidiasis of skin and nail: Secondary | ICD-10-CM | POA: Diagnosis not present

## 2017-03-30 DIAGNOSIS — L570 Actinic keratosis: Secondary | ICD-10-CM | POA: Diagnosis not present

## 2017-03-30 DIAGNOSIS — D692 Other nonthrombocytopenic purpura: Secondary | ICD-10-CM | POA: Diagnosis not present

## 2017-03-30 DIAGNOSIS — D239 Other benign neoplasm of skin, unspecified: Secondary | ICD-10-CM | POA: Diagnosis not present

## 2017-03-30 DIAGNOSIS — L821 Other seborrheic keratosis: Secondary | ICD-10-CM | POA: Diagnosis not present

## 2017-03-30 DIAGNOSIS — Z85828 Personal history of other malignant neoplasm of skin: Secondary | ICD-10-CM | POA: Diagnosis not present

## 2017-03-30 DIAGNOSIS — Z872 Personal history of diseases of the skin and subcutaneous tissue: Secondary | ICD-10-CM | POA: Diagnosis not present

## 2017-04-02 DIAGNOSIS — G894 Chronic pain syndrome: Secondary | ICD-10-CM | POA: Diagnosis not present

## 2017-04-02 DIAGNOSIS — M419 Scoliosis, unspecified: Secondary | ICD-10-CM | POA: Diagnosis not present

## 2017-04-02 DIAGNOSIS — M47816 Spondylosis without myelopathy or radiculopathy, lumbar region: Secondary | ICD-10-CM | POA: Diagnosis not present

## 2017-04-02 DIAGNOSIS — M545 Low back pain: Secondary | ICD-10-CM | POA: Diagnosis not present

## 2017-04-08 DIAGNOSIS — M79672 Pain in left foot: Secondary | ICD-10-CM | POA: Diagnosis not present

## 2017-04-08 DIAGNOSIS — M79671 Pain in right foot: Secondary | ICD-10-CM | POA: Diagnosis not present

## 2017-04-08 DIAGNOSIS — M2012 Hallux valgus (acquired), left foot: Secondary | ICD-10-CM | POA: Diagnosis not present

## 2017-04-08 DIAGNOSIS — M2011 Hallux valgus (acquired), right foot: Secondary | ICD-10-CM | POA: Diagnosis not present

## 2017-04-30 DIAGNOSIS — R69 Illness, unspecified: Secondary | ICD-10-CM | POA: Diagnosis not present

## 2017-05-03 ENCOUNTER — Other Ambulatory Visit: Payer: Self-pay | Admitting: Family Medicine

## 2017-05-03 DIAGNOSIS — Z01419 Encounter for gynecological examination (general) (routine) without abnormal findings: Secondary | ICD-10-CM | POA: Insufficient documentation

## 2017-05-03 DIAGNOSIS — Z0000 Encounter for general adult medical examination without abnormal findings: Secondary | ICD-10-CM | POA: Insufficient documentation

## 2017-05-04 ENCOUNTER — Ambulatory Visit (INDEPENDENT_AMBULATORY_CARE_PROVIDER_SITE_OTHER): Payer: Medicare HMO

## 2017-05-04 VITALS — BP 122/78 | HR 83 | Temp 98.5°F | Ht 64.5 in | Wt 181.2 lb

## 2017-05-04 DIAGNOSIS — Z01419 Encounter for gynecological examination (general) (routine) without abnormal findings: Secondary | ICD-10-CM

## 2017-05-04 DIAGNOSIS — Z0000 Encounter for general adult medical examination without abnormal findings: Secondary | ICD-10-CM | POA: Diagnosis not present

## 2017-05-04 LAB — LIPID PANEL
CHOL/HDL RATIO: 3
CHOLESTEROL: 174 mg/dL (ref 0–200)
HDL: 53.4 mg/dL (ref >39.00)
LDL CALC: 95 mg/dL (ref 0–99)
NonHDL: 120.64
TRIGLYCERIDES: 129 mg/dL (ref 0.0–149.0)
VLDL: 25.8 mg/dL (ref 0.0–40.0)

## 2017-05-04 LAB — CBC WITH DIFFERENTIAL/PLATELET
BASOS PCT: 1.4 % (ref 0.0–3.0)
Basophils Absolute: 0.1 10*3/uL (ref 0.0–0.1)
EOS ABS: 0.3 10*3/uL (ref 0.0–0.7)
EOS PCT: 3.8 % (ref 0.0–5.0)
HEMATOCRIT: 43.3 % (ref 36.0–46.0)
HEMOGLOBIN: 14.3 g/dL (ref 12.0–15.0)
LYMPHS PCT: 28.7 % (ref 12.0–46.0)
Lymphs Abs: 2.6 10*3/uL (ref 0.7–4.0)
MCHC: 33.2 g/dL (ref 30.0–36.0)
MCV: 89.7 fl (ref 78.0–100.0)
Monocytes Absolute: 0.8 10*3/uL (ref 0.1–1.0)
Monocytes Relative: 8.7 % (ref 3.0–12.0)
NEUTROS ABS: 5.2 10*3/uL (ref 1.4–7.7)
Neutrophils Relative %: 57.4 % (ref 43.0–77.0)
PLATELETS: 268 10*3/uL (ref 150.0–400.0)
RBC: 4.82 Mil/uL (ref 3.87–5.11)
RDW: 13.1 % (ref 11.5–15.5)
WBC: 9 10*3/uL (ref 4.0–10.5)

## 2017-05-04 LAB — COMPREHENSIVE METABOLIC PANEL
ALBUMIN: 4 g/dL (ref 3.5–5.2)
ALT: 23 U/L (ref 0–35)
AST: 25 U/L (ref 0–37)
Alkaline Phosphatase: 64 U/L (ref 39–117)
BUN: 14 mg/dL (ref 6–23)
CALCIUM: 9.7 mg/dL (ref 8.4–10.5)
CO2: 29 meq/L (ref 19–32)
CREATININE: 0.85 mg/dL (ref 0.40–1.20)
Chloride: 102 mEq/L (ref 96–112)
GFR: 70.63 mL/min (ref >60.00)
Glucose, Bld: 112 mg/dL — ABNORMAL HIGH (ref 70–99)
POTASSIUM: 4.5 meq/L (ref 3.5–5.1)
Sodium: 140 mEq/L (ref 135–145)
Total Bilirubin: 0.6 mg/dL (ref 0.2–1.2)
Total Protein: 6.5 g/dL (ref 6.0–8.3)

## 2017-05-04 LAB — TSH: TSH: 2.08 u[IU]/mL (ref 0.35–4.50)

## 2017-05-04 NOTE — Progress Notes (Signed)
PCP notes:   Health maintenance:  Flu vaccine - administered at [facility] Mammogram - PCP please address at next appt Tetanus vaccine - postponed/insurance  Abnormal screenings:   Hearing -failed  Hearing Screening   125Hz  250Hz  500Hz  1000Hz  2000Hz  3000Hz  4000Hz  6000Hz  8000Hz   Right ear:   40 40 40  0    Left ear:   40 40 40  40      Patient concerns:   Medication management - pt wants to discuss anxiety and GERD medications Hair loss - continues despite use of Biotin supplement Foot cramps - started K+ supplement Intermittent bruising - thinks it may be due to pain injections  Nurse concerns:  None  Next PCP appt:   05/11/2017 @ 1000

## 2017-05-04 NOTE — Progress Notes (Signed)
Pre visit review using our clinic review tool, if applicable. No additional management support is needed unless otherwise documented below in the visit note. 

## 2017-05-04 NOTE — Progress Notes (Signed)
I reviewed health advisor's note, was available for consultation, and agree with documentation and plan.  

## 2017-05-04 NOTE — Progress Notes (Signed)
Subjective:   Brittney Pierce is a 66 y.o. female who presents for Medicare Annual (Subsequent) preventive examination.  Review of Systems:  N/A Cardiac Risk Factors include: advanced age (>65mn, >>36women);hypertension;obesity (BMI >30kg/m2)     Objective:     Vitals: BP 122/78 (BP Location: Right Arm, Patient Position: Sitting, Cuff Size: Normal)   Pulse 83   Temp 98.5 F (36.9 C) (Oral)   Ht 5' 4.5" (1.638 m) Comment: no shoes  Wt 181 lb 4 oz (82.2 kg)   SpO2 96%   BMI 30.63 kg/m   Body mass index is 30.63 kg/m.   Tobacco History  Smoking Status  . Former Smoker  . Quit date: 07/27/1980  Smokeless Tobacco  . Never Used     Counseling given: No   Past Medical History:  Diagnosis Date  . Disorder of bone and cartilage, unspecified   . Dysuria   . Esophageal reflux   . Lumbago   . Osteoarthrosis, unspecified whether generalized or localized, unspecified site   . Ulcerative colitis, unspecified   . Unspecified essential hypertension    Past Surgical History:  Procedure Laterality Date  . CESAREAN SECTION     x2  . CHOLECYSTECTOMY    . [place]  [place]   Family History  Problem Relation Age of Onset  . Heart attack Father 780 . Diabetes Father   . Lung cancer Mother    History  Sexual Activity  . Sexual activity: Not on file    Outpatient Encounter Prescriptions as of 05/04/2017  Medication Sig  . BIOTIN PO Take 1 capsule by mouth daily.  . Calcium 1200-1000 MG-UNIT CHEW Chew 1 tablet by mouth daily.    . cetirizine (ZYRTEC) 10 MG tablet Take 10 mg by mouth daily.    . fluticasone (FLONASE) 50 MCG/ACT nasal spray 2 sprays each nostril as directed.  . hydrocortisone (ANUSOL-HC) 25 MG suppository Place 1 suppository (25 mg total) rectally 2 (two) times daily as needed.  . mesalamine (CANASA) 1000 MG suppository Place 1,000 mg rectally at bedtime as needed.  . metoprolol tartrate (LOPRESSOR) 25 MG tablet TAKE 1/2 TABLET TWICE DAILY  (SUBSTITUTED FOR  LOPRESSOR)  . omeprazole (PRILOSEC) 40 MG capsule Take 1 capsule (40 mg total) by mouth daily.  .Marland KitchenPOTASSIUM GLUCONATE PO Take 1 capsule by mouth daily.  . Probiotic Product (PROBIOTIC DAILY PO) Take by mouth.  . traZODone (DESYREL) 50 MG tablet Take 0.5-1 tablets (25-50 mg total) by mouth at bedtime as needed for sleep. (Patient not taking: Reported on 05/04/2017)  . [DISCONTINUED] celecoxib (CELEBREX) 200 MG capsule Take 200 mg by mouth daily.   . [DISCONTINUED] ciprofloxacin (CIPRO) 250 MG tablet Take 1 tablet (250 mg total) by mouth 2 (two) times daily.   No facility-administered encounter medications on file as of 05/04/2017.     Activities of Daily Living In your present state of health, do you have any difficulty performing the following activities: 05/04/2017  Hearing? N  Vision? N  Difficulty concentrating or making decisions? N  Walking or climbing stairs? Y  Comment concerns with back  Dressing or bathing? N  Doing errands, shopping? N  Preparing Food and eating ? N  Using the Toilet? N  In the past six months, have you accidently leaked urine? Y  Do you have problems with loss of bowel control? N  Managing your Medications? N  Managing your Finances? N  Housekeeping or managing your Housekeeping? N  Some recent data  might be hidden    Patient Care Team: Lucille Passy, MD as PCP - Okey Regal, PA-C (Inactive) as Consulting Physician (Gastroenterology) Jannet Mantis, MD as Consulting Physician (Dermatology) Roosvelt Harps., OD as Consulting Physician (Optometry)    Assessment:     Hearing Screening   125Hz  250Hz  500Hz  1000Hz  2000Hz  3000Hz  4000Hz  6000Hz  8000Hz   Right ear:   40 40 40  0    Left ear:   40 40 40  40    Vision Screening Comments: Last vision exam in March 2018 with Dr. Maryruth Hancock B.    Exercise Activities and Dietary recommendations Current Exercise Habits: Home exercise routine, Type of exercise: walking;Other - see  comments (elliptical), Time (Minutes): 20, Frequency (Times/Week): 3, Weekly Exercise (Minutes/Week): 60, Exercise limited by: orthopedic condition(s)  Goals    . Increase physical activity          Starting 10/92018, I will continue to exercise for at least 20 min 3 days per week.       Fall Risk Fall Risk  05/04/2017 04/28/2016 03/20/2015 02/13/2014  Falls in the past year? No No No No   Depression Screen PHQ 2/9 Scores 05/04/2017 04/28/2016 04/28/2016 03/20/2015  PHQ - 2 Score 0 1 1 0  PHQ- 9 Score 0 - - -     Cognitive Function MMSE - Mini Mental State Exam 05/04/2017  Orientation to time 5  Orientation to Place 5  Registration 3  Attention/ Calculation 0  Recall 3  Language- name 2 objects 0  Language- repeat 1  Language- follow 3 step command 3  Language- read & follow direction 0  Write a sentence 0  Copy design 0  Total score 20       PLEASE NOTE: A Mini-Cog screen was completed. Maximum score is 20. A value of 0 denotes this part of [place]stein MMSE was not completed or the patient failed this part of the Mini-Cog screening.   Mini-Cog Screening Orientation to Time - Max 5 pts Orientation to Place - Max 5 pts Registration - Max 3 pts Recall - Max 3 pts Language Repeat - Max 1 pts Language Follow 3 Step Command - Max 3 pts   Immunization History  Administered Date(s) Administered  . Influenza Split 05/12/2011  . Influenza Whole 04/29/2010  . Influenza, High Dose Seasonal PF 04/30/2017  . Influenza,inj,Quad PF,6+ Mos 04/25/2013, 05/07/2014, 03/20/2015  . Pneumococcal Conjugate-13 03/20/2015  . Pneumococcal Polysaccharide-23 02/13/2014  . Zoster 04/07/2011   Screening Tests Health Maintenance  Topic Date Due  . MAMMOGRAM  03/01/2018 (Originally 03/02/2016)  . TETANUS/TDAP  05/05/2027 (Originally 01/16/1970)  . COLONOSCOPY  08/07/2018  . INFLUENZA VACCINE  Completed  . DEXA SCAN  Completed  . Hepatitis C Screening  Completed  . PNA vac Low Risk Adult  Completed        Plan:     I have personally reviewed and addressed the Medicare Annual Wellness questionnaire and have noted the following in the patient's chart:  A. Medical and social history B. Use of alcohol, tobacco or illicit drugs  C. Current medications and supplements D. Functional ability and status E.  Nutritional status F.  Physical activity G. Advance directives H. List of other physicians I.  Hospitalizations, surgeries, and ER visits in previous 12 months J.  [place] to include hearing, vision, cognitive, depression L. Referrals and appointments - none  In addition, I have reviewed and discussed with patient certain preventive protocols, quality metrics, and  best practice recommendations. A written personalized care plan for preventive services as well as general preventive health recommendations were provided to patient.  See attached scanned questionnaire for additional information.   Signed,   Lindell Noe, MHA, BS, LPN Health Coach

## 2017-05-04 NOTE — Patient Instructions (Signed)
Brittney Pierce , Thank you for taking time to come for your Medicare Wellness Visit. I appreciate your ongoing commitment to your health goals. Please review the following plan we discussed and let me know if I can assist you in the future.   These are the goals we discussed: Goals    . Increase physical activity          Starting 10/92018, I will continue to exercise for at least 20 min 3 days per week.        This is a list of the screening recommended for you and due dates:  Health Maintenance  Topic Date Due  . Mammogram  03/01/2018*  . Tetanus Vaccine  05/05/2027*  . Colon Cancer Screening  08/07/2018  . Flu Shot  Completed  . DEXA scan (bone density measurement)  Completed  .  Hepatitis C: One time screening is recommended by Center for Disease Control  (CDC) for  adults born from [birth year] through 1967.   Completed  . Pneumonia vaccines  Completed  *Topic was postponed. The date shown is not the original due date.   Preventive Care for Adults  A healthy lifestyle and preventive care can promote health and wellness. Preventive health guidelines for adults include the following key practices.  . A routine yearly physical is a good way to check with your health care provider about your health and preventive screening. It is a chance to share any concerns and updates on your health and to receive a thorough exam.  . Visit your dentist for a routine exam and preventive care every 6 months. Brush your teeth twice a day and floss once a day. Good oral hygiene prevents tooth decay and gum disease.  . The frequency of eye exams is based on your age, health, family medical history, use  of contact lenses, and other factors. Follow your health care provider's ecommendations for frequency of eye exams.  . Eat a healthy diet. Foods like vegetables, fruits, whole grains, low-fat dairy products, and lean protein foods contain the nutrients you need without too many calories. Decrease your  intake of foods high in solid fats, added sugars, and salt. Eat the right amount of calories for you. Get information about a proper diet from your health care provider, if necessary.  . Regular physical exercise is one of the most important things you can do for your health. Most adults should get at least 150 minutes of moderate-intensity exercise (any activity that increases your heart rate and causes you to sweat) each week. In addition, most adults need muscle-strengthening exercises on 2 or more days a week.  Silver Sneakers may be a benefit available to you. To determine eligibility, you may visit the website: www.silversneakers.com or contact program at 765-280-4080 Mon-Fri between 8AM-8PM.   . Maintain a healthy weight. The body mass index (BMI) is a screening tool to identify possible weight problems. It provides an estimate of body fat based on height and weight. Your health care provider can find your BMI and can help you achieve or maintain a healthy weight.   For adults 20 years and older: ? A BMI below 18.5 is considered underweight. ? A BMI of 18.5 to 24.9 is normal. ? A BMI of 25 to 29.9 is considered overweight. ? A BMI of 30 and above is considered obese.   . Maintain normal blood lipids and cholesterol levels by exercising and minimizing your intake of saturated fat. Eat a balanced diet with plenty of  fruit and vegetables. Blood tests for lipids and cholesterol should begin at age 4 and be repeated every 5 years. If your lipid or cholesterol levels are high, you are over 50, or you are at high risk for heart disease, you may need your cholesterol levels checked more frequently. Ongoing high lipid and cholesterol levels should be treated with medicines if diet and exercise are not working.  . If you smoke, find out from your health care provider how to quit. If you do not use tobacco, please do not start.  . If you choose to drink alcohol, please do not consume more than 2  drinks per day. One drink is considered to be 12 ounces (355 mL) of beer, 5 ounces (148 mL) of wine, or 1.5 ounces (44 mL) of liquor.  . If you are 87-28 years old, ask your health care provider if you should take aspirin to prevent strokes.  . Use sunscreen. Apply sunscreen liberally and repeatedly throughout the day. You should seek shade when your shadow is shorter than you. Protect yourself by wearing long sleeves, pants, a wide-brimmed hat, and sunglasses year round, whenever you are outdoors.  . Once a month, do a whole body skin exam, using a mirror to look at the skin on your back. Tell your health care provider of new moles, moles that have irregular borders, moles that are larger than a pencil eraser, or moles that have changed in shape or color.

## 2017-05-11 ENCOUNTER — Ambulatory Visit (INDEPENDENT_AMBULATORY_CARE_PROVIDER_SITE_OTHER): Payer: Medicare HMO | Admitting: Family Medicine

## 2017-05-11 ENCOUNTER — Encounter: Payer: Self-pay | Admitting: Family Medicine

## 2017-05-11 VITALS — BP 134/80 | HR 72 | Temp 97.4°F | Ht 65.0 in | Wt 185.0 lb

## 2017-05-11 DIAGNOSIS — I10 Essential (primary) hypertension: Secondary | ICD-10-CM | POA: Diagnosis not present

## 2017-05-11 DIAGNOSIS — Z0000 Encounter for general adult medical examination without abnormal findings: Secondary | ICD-10-CM

## 2017-05-11 DIAGNOSIS — Z01419 Encounter for gynecological examination (general) (routine) without abnormal findings: Secondary | ICD-10-CM

## 2017-05-11 DIAGNOSIS — K219 Gastro-esophageal reflux disease without esophagitis: Secondary | ICD-10-CM | POA: Diagnosis not present

## 2017-05-11 DIAGNOSIS — K51219 Ulcerative (chronic) proctitis with unspecified complications: Secondary | ICD-10-CM

## 2017-05-11 DIAGNOSIS — F419 Anxiety disorder, unspecified: Secondary | ICD-10-CM | POA: Diagnosis not present

## 2017-05-11 DIAGNOSIS — F418 Other specified anxiety disorders: Secondary | ICD-10-CM | POA: Insufficient documentation

## 2017-05-11 DIAGNOSIS — R69 Illness, unspecified: Secondary | ICD-10-CM | POA: Diagnosis not present

## 2017-05-11 MED ORDER — MELOXICAM 15 MG PO TABS: 15.0000 mg | ORAL_TABLET | Freq: Every day | ORAL | 0 refills | Status: DC

## 2017-05-11 MED ORDER — ALPRAZOLAM 0.25 MG PO TABS: 0.2500 mg | ORAL_TABLET | Freq: Two times a day (BID) | ORAL | 0 refills | Status: DC | PRN

## 2017-05-11 MED ORDER — OMEPRAZOLE 40 MG PO CPDR: 40.0000 mg | DELAYED_RELEASE_CAPSULE | Freq: Every day | ORAL | 3 refills | Status: DC

## 2017-05-11 NOTE — Patient Instructions (Signed)
Have a great trip to [place]

## 2017-05-11 NOTE — Assessment & Plan Note (Signed)
In remission.

## 2017-05-11 NOTE — Assessment & Plan Note (Signed)
PPI rx refilled.

## 2017-05-11 NOTE — Assessment & Plan Note (Signed)
She uses very sparingly. Has not had refilled since to 2016. Rx refilled today and given to pt. Has fear of travel with panic attacks.

## 2017-05-11 NOTE — Progress Notes (Signed)
Subjective:   Patient ID: Brittney Pierce, female    DOB: Dec 04, 1950, 65 y.o.   MRN: [id number]  Brittney Pierce is a pleasant 66 y.o. year old female who presents to clinic today with Follow-up (Patient is here today for a follow-up. She states that she is going to [place] in a [place].  Is requesting a prescription of Alprazolam 0.26m for this trip.  She brought in an old bottle from 2016 that still has some in it.) and Annual Exam  on 05/11/2017  HPI:  Annual medicare wellness visit with TCandis Musa RN on 05/04/17. Notes reviewed.   Health Maintenance  Topic Date Due  . MAMMOGRAM  03/01/2018 (Originally 03/02/2016)  . TETANUS/TDAP  05/05/2027 (Originally 01/16/1968)  . COLONOSCOPY  08/07/2018  . INFLUENZA VACCINE  Completed  . DEXA SCAN  Completed  . Hepatitis C Screening  Completed  . PNA vac Low Risk Adult  Completed     Ulcerative colitis- distal disease (proctitis)- diagnosed in her late 439s   [facility]ed by [facility]. Has been in remission with probiotics. Does have Canasa suppositories to use as needed.  HTN- has been well controlled with metoprolol 12.5 mg twice daily.  GERD- taking prilosec 40 mg daily. Denies any symptoms of GERD currently.  [place]- [place].  Asking for xanax rx for her trip.  Current Outpatient Prescriptions on File Prior to Visit  Medication Sig Dispense Refill  . BIOTIN PO Take 1 capsule by mouth daily.    . Calcium 1200-1000 MG-UNIT CHEW Chew 1 tablet by mouth daily.      . cetirizine (ZYRTEC) 10 MG tablet Take 10 mg by mouth daily.      . fluticasone (FLONASE) 50 MCG/ACT nasal spray 2 sprays each nostril as directed. 48 g 1  . hydrocortisone (ANUSOL-HC) 25 MG suppository Place 1 suppository (25 mg total) rectally 2 (two) times daily as needed. 12 suppository 3  . mesalamine (CANASA) 1000 MG suppository Place 1,000 mg rectally at bedtime as needed.    . metoprolol tartrate (LOPRESSOR) 25 MG tablet TAKE 1/2 TABLET  TWICE DAILY (SUBSTITUTED FOR  LOPRESSOR) 90 tablet 1  . omeprazole (PRILOSEC) 40 MG capsule Take 1 capsule (40 mg total) by mouth daily. 90 capsule 3  . POTASSIUM GLUCONATE PO Take 1 capsule by mouth daily.    . Probiotic Product (PROBIOTIC DAILY PO) Take by mouth.     No current facility-administered medications on file prior to visit.     Allergies  Allergen Reactions  . Cephalexin   . Codeine     REACTION: stomach upset  . Doxycycline     GI side effects  . Erythromycin     REACTION: stomach upset    Past Medical History:  Diagnosis Date  . Disorder of bone and cartilage, unspecified   . Dysuria   . Esophageal reflux   . Lumbago   . Osteoarthrosis, unspecified whether generalized or localized, unspecified site   . Ulcerative colitis, unspecified   . Unspecified essential hypertension     Past Surgical History:  Procedure Laterality Date  . CESAREAN SECTION     x2  . CHOLECYSTECTOMY    . LIolaSURGERY  2008    Family History  Problem Relation Age of Onset  . Heart attack Father 741 . Diabetes Father   . Lung cancer Mother     Social History   Social History  . Marital status: Married    Spouse name: N/A  .  Number of children: N/A  . Years of education: N/A   Occupational History  . Retired    Social History Main Topics  . Smoking status: Former Smoker    Quit date: 07/27/1980  . Smokeless tobacco: Never Used  . Alcohol use Yes     Comment: Once a month (maybe)  . Drug use: No  . Sexual activity: Not on file   Other Topics Concern  . Not on file   Social History Narrative   Regular exercise-yes      Moved here from [facility] to be closer to grandchildren      Likes to garden      Does have a living will.  Husband is HPOA.   Would desire life support, no prolonged life support if futile.            The PMH, PSH, Social History, Family History, Medications, and allergies have been reviewed in [facility], and have been updated if  relevant.   Review of Systems  Constitutional: Negative.   HENT: Negative.   Eyes: Negative.   Respiratory: Negative.   Cardiovascular: Negative.   Gastrointestinal: Negative.   Endocrine: Negative.   Genitourinary: Negative.   Musculoskeletal: Negative.   Allergic/Immunologic: Negative.   Neurological: Negative.   Hematological: Negative.   Psychiatric/Behavioral: Negative.   All other systems reviewed and are negative.      Objective:    BP 134/80 (BP Location: Left Arm, Patient Position: Sitting, Cuff Size: Normal)   Pulse 72   Temp (!) 97.4 F (36.3 C) (Oral)   Ht 5' 5"  (1.651 m)   Wt 185 lb (83.9 kg)   SpO2 98%   BMI 30.79 kg/m    Physical Exam   General:  Well-developed,well-nourished,in no acute distress; alert,appropriate and cooperative throughout examination Head:  normocephalic and atraumatic.   Eyes:  vision grossly intact, PERRL Ears:  R ear normal and L ear normal externally, TMs clear bilaterally Nose:  no external deformity.   Mouth:  good dentition.   Neck:  No deformities, masses, or tenderness noted. Breasts:  No mass, nodules, thickening, tenderness, bulging, retraction, inflamation, nipple discharge or skin changes noted.   Lungs:  Normal respiratory effort, chest expands symmetrically. Lungs are clear to auscultation, no crackles or wheezes. Heart:  Normal rate and regular rhythm. S1 and S2 normal without gallop, murmur, click, rub or other extra sounds. Abdomen:  Bowel sounds positive,abdomen soft and non-tender without masses, organomegaly or hernias noted. Msk:  No deformity or scoliosis noted of thoracic or lumbar spine.   Extremities:  No clubbing, cyanosis, edema, or deformity noted with normal full range of motion of all joints.   Neurologic:  alert & oriented X3 and gait normal.   Skin:  Intact without suspicious lesions or rashes Cervical Nodes:  No lymphadenopathy noted Axillary Nodes:  No palpable lymphadenopathy Psych:  Cognition  and judgment appear intact. Alert and cooperative with normal attention span and concentration. No apparent delusions, illusions, hallucinations       Assessment & Plan:   Well woman exam  Gastroesophageal reflux disease, esophagitis presence not specified  Ulcerative proctitis with complication [facility])  Essential hypertension  Anxiety No Follow-up on file.

## 2017-05-11 NOTE — Assessment & Plan Note (Signed)
Reviewed preventive care protocols, scheduled due services, and updated immunizations Discussed nutrition, exercise, diet, and healthy lifestyle.  

## 2017-05-11 NOTE — Assessment & Plan Note (Signed)
Well controlled on current rxs. No changes made today.

## 2017-06-02 ENCOUNTER — Other Ambulatory Visit: Payer: Self-pay | Admitting: Family Medicine

## 2017-06-10 ENCOUNTER — Ambulatory Visit (INDEPENDENT_AMBULATORY_CARE_PROVIDER_SITE_OTHER): Payer: Medicare HMO | Admitting: Internal Medicine

## 2017-06-10 ENCOUNTER — Encounter: Payer: Self-pay | Admitting: Internal Medicine

## 2017-06-10 ENCOUNTER — Other Ambulatory Visit (INDEPENDENT_AMBULATORY_CARE_PROVIDER_SITE_OTHER): Payer: Medicare HMO

## 2017-06-10 DIAGNOSIS — N3000 Acute cystitis without hematuria: Secondary | ICD-10-CM | POA: Insufficient documentation

## 2017-06-10 LAB — URINALYSIS, ROUTINE W REFLEX MICROSCOPIC
Bilirubin Urine: NEGATIVE
Ketones, ur: NEGATIVE
Nitrite: NEGATIVE
Specific Gravity, Urine: 1.025
Total Protein, Urine: NEGATIVE
Urine Glucose: NEGATIVE
Urobilinogen, UA: 0.2
pH: 6 (ref 5.0–8.0)

## 2017-06-10 MED ORDER — CIPROFLOXACIN HCL 250 MG PO TABS: 250.0000 mg | ORAL_TABLET | Freq: Two times a day (BID) | ORAL | 0 refills | Status: DC

## 2017-06-10 NOTE — Patient Instructions (Signed)

## 2017-06-10 NOTE — Assessment & Plan Note (Addendum)
Cipro AZO prn UA  Potential benefits of Cipro use as well as potential risks  and complications were explained to the patient and were aknowledged.

## 2017-06-10 NOTE — Progress Notes (Signed)
Subjective:  Patient ID: Brittney Pierce, female    DOB: September 02, 1950  Age: 66 y.o. MRN: [id number]  CC: No chief complaint on file.   HPI Harvey Lingo presents for UTI sx's x few days, burning, urgency. Cipro helped well in the past  Outpatient Medications Prior to Visit  Medication Sig Dispense Refill  . ALPRAZolam (XANAX) 0.25 MG tablet Take 1 tablet (0.25 mg total) by mouth 2 (two) times daily as needed for anxiety. 30 tablet 0  . BIOTIN PO Take 1 capsule by mouth daily.    . Calcium 1200-1000 MG-UNIT CHEW Chew 1 tablet by mouth daily.      . cetirizine (ZYRTEC) 10 MG tablet Take 10 mg by mouth daily.      . cyanocobalamin 500 MCG tablet Take 500 mcg by mouth daily.    . diclofenac sodium (VOLTAREN) 1 % GEL Apply topically 4 (four) times daily.    . Emollient (COLLAGEN EX) Apply topically.    . fluticasone (FLONASE) 50 MCG/ACT nasal spray 2 sprays each nostril as directed. 48 g 1  . hydrocortisone (ANUSOL-HC) 25 MG suppository Place 1 suppository (25 mg total) rectally 2 (two) times daily as needed. 12 suppository 3  . meloxicam (MOBIC) 15 MG tablet Take 1 tablet (15 mg total) by mouth daily. 90 tablet 0  . mesalamine (CANASA) 1000 MG suppository Place 1,000 mg rectally at bedtime as needed.    . metoprolol tartrate (LOPRESSOR) 25 MG tablet TAKE 1/2 TABLET TWICE A DAY(SUBSTITUTED FOR LOPRESSOR) 90 tablet 1  . omeprazole (PRILOSEC) 40 MG capsule Take 1 capsule (40 mg total) by mouth daily. 90 capsule 3  . POTASSIUM GLUCONATE PO Take 1 capsule by mouth daily.    . Probiotic Product (PROBIOTIC DAILY PO) Take by mouth.     No facility-administered medications prior to visit.     ROS Review of Systems  Objective:  BP 122/84 (BP Location: Left Arm, Patient Position: Sitting, Cuff Size: Large)   Pulse 71   Temp 97.7 F (36.5 C) (Oral)   Ht 5' 5"  (1.651 m)   Wt 185 lb (83.9 kg)   SpO2 99%   BMI 30.79 kg/m   BP Readings from Last 3 Encounters:  06/10/17 122/84    05/11/17 134/80  05/04/17 122/78    Wt Readings from Last 3 Encounters:  06/10/17 185 lb (83.9 kg)  05/11/17 185 lb (83.9 kg)  05/04/17 181 lb 4 oz (82.2 kg)    Physical Exam  Lab Results  Component Value Date   WBC 9.0 05/04/2017   HGB 14.3 05/04/2017   HCT 43.3 05/04/2017   PLT 268.0 05/04/2017   GLUCOSE 112 (H) 05/04/2017   CHOL 174 05/04/2017   TRIG 129.0 05/04/2017   HDL 53.40 05/04/2017   LDLCALC 95 05/04/2017   ALT 23 05/04/2017   AST 25 05/04/2017   NA 140 05/04/2017   K 4.5 05/04/2017   CL 102 05/04/2017   CREATININE 0.85 05/04/2017   BUN 14 05/04/2017   CO2 29 05/04/2017   TSH 2.08 05/04/2017   HGBA1C 6.2 03/20/2015    Dg Lumbar Spine Complete  Result Date: 01/06/2017 CLINICAL DATA:  Lower RIGHT-sided back pain for 10 years. Pain has gotten worse recently. EXAM: LUMBAR SPINE - COMPLETE 4+ VIEW COMPARISON:  03/20/2015. FINDINGS: Transitional anatomy. Only partial sacralization of S1 on the RIGHT. Degenerative scoliosis convex RIGHT approximately 27 degrees, progressed from prior, epicenter L3. Lower lumbar facet arthropathy. Asymmetric loss of interspace height at L2-3 and L3-4  on the LEFT. Asymmetric loss of interspace height at L5-S1 on the RIGHT. Cholecystectomy. IMPRESSION: Progressed degenerative scoliosis mid lumbar region, now 27 degrees. RIGHT-sided symptoms could originate from the L5-S1 level, due to subarticular zone or foraminal zone narrowing. If further investigation desired, consider cross-sectional imaging. Electronically Signed   By: Staci Righter M.D.   On: 01/06/2017 15:39    Assessment & Plan:   There are no diagnoses linked to this encounter. I am having [place] maintain her CALCIUM, cetirizine, Probiotic Product (PROBIOTIC DAILY PO), fluticasone, hydrocortisone, mesalamine, BIOTIN PO, POTASSIUM GLUCONATE PO, Emollient (COLLAGEN EX), cyanocobalamin, diclofenac sodium, ALPRAZolam, meloxicam, omeprazole, and metoprolol  tartrate.  No orders of the defined types were placed in this encounter.    Follow-up: No Follow-up on file.  Walker Kehr, MD

## 2017-07-08 DIAGNOSIS — L821 Other seborrheic keratosis: Secondary | ICD-10-CM | POA: Diagnosis not present

## 2017-07-30 ENCOUNTER — Ambulatory Visit (INDEPENDENT_AMBULATORY_CARE_PROVIDER_SITE_OTHER)
Admission: RE | Admit: 2017-07-30 | Discharge: 2017-07-30 | Disposition: A | Discharge status: Home / Self Care | Payer: Medicare HMO | Source: Ambulatory Visit | Attending: Family Medicine | Admitting: Family Medicine

## 2017-07-30 ENCOUNTER — Encounter: Payer: Self-pay | Admitting: Family Medicine

## 2017-07-30 ENCOUNTER — Ambulatory Visit (INDEPENDENT_AMBULATORY_CARE_PROVIDER_SITE_OTHER): Payer: Medicare HMO | Admitting: Family Medicine

## 2017-07-30 VITALS — BP 126/78 | HR 78 | Temp 97.3°F | Wt 186.0 lb

## 2017-07-30 DIAGNOSIS — M545 Low back pain, unspecified: Secondary | ICD-10-CM

## 2017-07-30 DIAGNOSIS — M25511 Pain in right shoulder: Secondary | ICD-10-CM | POA: Diagnosis not present

## 2017-07-30 DIAGNOSIS — G8929 Other chronic pain: Secondary | ICD-10-CM | POA: Diagnosis not present

## 2017-07-30 MED ORDER — MELOXICAM 15 MG PO TABS: 15.0000 mg | ORAL_TABLET | Freq: Every day | ORAL | 0 refills | Status: DC

## 2017-07-30 NOTE — Progress Notes (Signed)
Subjective:    Patient ID: Brittney Pierce, female    DOB: 1950-12-21, 67 y.o.   MRN: [id number]  HPI This is a 67 yo female who presents today with several concerns. Is planning to establish care, previously saw Dr. Deborra Medina. Last CPE 10/18.   She has a history of low back pain. [place] a couple of weeks ago (slipped on wet ground) and several days later noticed that her scoliosis is gone. Feels like her pain is different. Not worse or more bothersome. No additional falls, no weakness, no bowel/bladder incontinence. Does not do exercises.   Has had right shoulder pain for several months, no known injury, maybe some over use, ? Strain pulling on a coat. Has pain with sleeping on it and difficulty raising arm. Acts up more with over use like mixing while cooking. Takes meloxicam 15 mg every day. Thinks it helps.   Past Medical History:  Diagnosis Date  . Disorder of bone and cartilage, unspecified   . Dysuria   . Esophageal reflux   . Lumbago   . Osteoarthrosis, unspecified whether generalized or localized, unspecified site   . Ulcerative colitis, unspecified   . Unspecified essential hypertension    Past Surgical History:  Procedure Laterality Date  . CESAREAN SECTION     x2  . CHOLECYSTECTOMY    . [place] SURGERY  2008   Family History  Problem Relation Age of Onset  . Heart attack Father 57  . Diabetes Father   . Lung cancer Mother    Social History   Tobacco Use  . Smoking status: Former Smoker    Last attempt to quit: 07/27/1980    Years since quitting: 37.0  . Smokeless tobacco: Never Used  Substance Use Topics  . Alcohol use: Yes    Comment: Once a month (maybe)  . Drug use: No      Review of Systems Per HPI    Objective:   Physical Exam  Constitutional: She is oriented to person, place, and time. She appears well-developed and well-nourished. No distress.  HENT:  Head: Normocephalic and atraumatic.  Eyes: Conjunctivae are normal.  Cardiovascular:  Normal rate, regular rhythm and normal heart sounds.  Pulmonary/Chest: Effort normal and breath sounds normal.  Musculoskeletal:       Right shoulder: She exhibits tenderness (mild joint space ). She exhibits normal range of motion, no bony tenderness and no swelling.       Lumbar back: She exhibits normal range of motion, no tenderness and no bony tenderness.  Neurological: She is alert and oriented to person, place, and time.  Skin: Skin is warm and dry. She is not diaphoretic.  Psychiatric: She has a normal mood and affect. Her behavior is normal. Judgment and thought content normal.  Vitals reviewed.    BP 126/78 (BP Location: Left Arm, Patient Position: Sitting, Cuff Size: Normal)   Pulse 78   Temp (!) 97.3 F (36.3 C) (Oral)   Wt 186 lb (84.4 kg)   SpO2 98%   BMI 30.95 kg/m  Wt Readings from Last 3 Encounters:  07/30/17 186 lb (84.4 kg)  06/10/17 185 lb (83.9 kg)  05/11/17 185 lb (83.9 kg)        Assessment & Plan:  1. Chronic midline low back pain without sciatica - will check xray given recent fall and change in pain - encouraged her to do core strengthening and flexibility exercises - DG Lumbar Spine Complete; Future  2. Acute pain of right shoulder -  ROM/ strength preserved, provided instructions for exercises, can use ice/heat in addition to meloxicam that she already takes - if not better in a couple of weeks, I suggested she see Dr. Lorelei Pont (sports medicine).    Clarene Reamer, FNP-BC  [facility]  08/02/2017 8:17 AM

## 2017-07-30 NOTE — Patient Instructions (Signed)
If not better in a couple of weeks, please schedule an appointment with Dr. Lorelei Pont for your shoulder  Shoulder Range of Motion Exercises Shoulder range of motion (ROM) exercises are designed to keep the shoulder moving freely. They are often recommended for people who have shoulder pain. Phase 1 exercises When you are able, do this exercise 5-6 days per week, or as told by your health care provider. Work toward doing 2 sets of 10 swings. Pendulum Exercise How To Do This Exercise Lying Down 1. Lie face-down on a bed with your abdomen close to the side of the bed. 2. Let your arm hang over the side of the bed. 3. Relax your shoulder, arm, and hand. 4. Slowly and gently swing your arm forward and back. Do not use your neck muscles to swing your arm. They should be relaxed. If you are struggling to swing your arm, have someone gently swing it for you. When you do this exercise for the first time, swing your arm at a 15 degree angle for 15 seconds, or swing your arm 10 times. As pain lessens over time, increase the angle of the swing to 30-45 degrees. 5. Repeat steps 1-4 with the other arm.  How To Do This Exercise While Standing 1. Stand next to a sturdy chair or table and hold on to it with your hand. 1. Bend forward at the waist. 2. Bend your knees slightly. 3. Relax your other arm and let it hang limp. 4. Relax the shoulder blade of the arm that is hanging and let it drop. 5. While keeping your shoulder relaxed, use body motion to swing your arm in small circles. The first time you do this exercise, swing your arm for about 30 seconds or 10 times. When you do it next time, swing your arm for a little longer. 6. Stand up tall and relax. 7. Repeat steps 1-7, this time changing the direction of the circles. 2. Repeat steps 1-8 with the other arm.  Phase 2 exercises Do these exercises 3-4 times per day on 5-6 days per week or as told by your health care provider. Work toward holding the stretch  for 20 seconds. Stretching Exercise 1 1. Lift your arm straight out in front of you. 2. Bend your arm 90 degrees at the elbow (right angle) so your forearm goes across your body and looks like the letter "L." 3. Use your other arm to gently pull the elbow forward and across your body. 4. Repeat steps 1-3 with the other arm. Stretching Exercise 2 You will need a towel or rope for this exercise. 1. Bend one arm behind your back with the palm facing outward. 2. Hold a towel with your other hand. 3. Reach the arm that holds the towel above your head, and bend that arm at the elbow. Your wrist should be behind your neck. 4. Use your free hand to grab the free end of the towel. 5. With the higher hand, gently pull the towel up behind you. 6. With the lower hand, pull the towel down behind you. 7. Repeat steps 1-6 with the other arm.  Phase 3 exercises Do each of these exercises at four different times of day (sessions) every day or as told by your health care provider. To begin with, repeat each exercise 5 times (repetitions). Work toward doing 3 sets of 12 repetitions or as told by your health care provider. Strengthening Exercise 1 You will need a light weight for this activity. As  you grow stronger, you may use a heavier weight. 1. Standing with a weight in your hand, lift your arm straight out to the side until it is at the same height as your shoulder. 2. Bend your arm at 90 degrees so that your fingers are pointing to the ceiling. 3. Slowly raise your hand until your arm is straight up in the air. 4. Repeat steps 1-3 with the other arm.  Strengthening Exercise 2 You will need a light weight for this activity. As you grow stronger, you may use a heavier weight. 1. Standing with a weight in your hand, gradually move your straight arm in an arc, starting at your side, then out in front of you, then straight up over your head. 2. Gradually move your other arm in an arc, starting at your side,  then out in front of you, then straight up over your head. 3. Repeat steps 1-2 with the other arm.  Strengthening Exercise 3 You will need an elastic band for this activity. As you grow stronger, gradually increase the size of the bands or increase the number of bands that you use at one time. 1. While standing, hold an elastic band in one hand and raise that arm up in the air. 2. With your other hand, pull down the band until that hand is by your side. 3. Repeat steps 1-2 with the other arm.  This information is not intended to replace advice given to you by your health care provider. Make sure you discuss any questions you have with your health care provider. Document Released: 04/11/2003 Document Revised: 03/08/2016 Document Reviewed: 07/09/2014 Elsevier Interactive Patient Education  [employer].

## 2017-08-02 ENCOUNTER — Encounter: Payer: Self-pay | Admitting: Family Medicine

## 2017-08-06 ENCOUNTER — Other Ambulatory Visit: Payer: Self-pay | Admitting: Family Medicine

## 2017-08-06 DIAGNOSIS — Z1231 Encounter for screening mammogram for malignant neoplasm of breast: Secondary | ICD-10-CM

## 2017-08-27 ENCOUNTER — Ambulatory Visit
Admission: RE | Admit: 2017-08-27 | Discharge: 2017-08-27 | Disposition: A | Discharge status: Home / Self Care | Payer: Medicare HMO | Source: Ambulatory Visit | Attending: Family Medicine | Admitting: Family Medicine

## 2017-08-27 DIAGNOSIS — Z1231 Encounter for screening mammogram for malignant neoplasm of breast: Secondary | ICD-10-CM | POA: Diagnosis not present

## 2017-08-30 ENCOUNTER — Telehealth: Payer: Self-pay | Admitting: Family Medicine

## 2017-08-30 NOTE — Telephone Encounter (Signed)
Called pt to schedule AWV. Please schedule after 10/16, and ask for Ebony Hail [phone]

## 2017-10-14 ENCOUNTER — Other Ambulatory Visit: Payer: Self-pay | Admitting: Family Medicine

## 2017-10-31 DIAGNOSIS — N390 Urinary tract infection, site not specified: Secondary | ICD-10-CM | POA: Diagnosis not present

## 2017-11-15 ENCOUNTER — Other Ambulatory Visit: Payer: Self-pay | Admitting: Family Medicine

## 2018-01-14 DIAGNOSIS — H524 Presbyopia: Secondary | ICD-10-CM | POA: Diagnosis not present

## 2018-01-14 DIAGNOSIS — H5212 Myopia, left eye: Secondary | ICD-10-CM | POA: Diagnosis not present

## 2018-01-14 DIAGNOSIS — H52213 Irregular astigmatism, bilateral: Secondary | ICD-10-CM | POA: Diagnosis not present

## 2018-01-14 DIAGNOSIS — H5201 Hypermetropia, right eye: Secondary | ICD-10-CM | POA: Diagnosis not present

## 2018-01-14 DIAGNOSIS — H25011 Cortical age-related cataract, right eye: Secondary | ICD-10-CM | POA: Diagnosis not present

## 2018-01-14 DIAGNOSIS — H2513 Age-related nuclear cataract, bilateral: Secondary | ICD-10-CM | POA: Diagnosis not present

## 2018-02-08 ENCOUNTER — Other Ambulatory Visit: Payer: Self-pay | Admitting: Family Medicine

## 2018-02-08 ENCOUNTER — Telehealth: Payer: Self-pay | Admitting: Family Medicine

## 2018-02-08 MED ORDER — METOPROLOL TARTRATE 25 MG PO TABS: ORAL_TABLET | ORAL | 0 refills | Status: DC

## 2018-02-08 NOTE — Telephone Encounter (Signed)
Copied from [place] [id number]. Topic: Quick Communication - Rx Refill/Question >> Feb 08, 2018 10:36 AM Bea Graff, NT wrote: Medication: metoprolol tartrate (LOPRESSOR) 25 MG tablet and meloxicam (MOBIC) 15 MG tablet   Has the patient contacted their pharmacy? Yes.   (Agent: If no, request that the patient contact the pharmacy for the refill.) (Agent: If yes, when and what did the pharmacy advise?)  Preferred Pharmacy (with phone number or street name): [place], [place] [phone] (Phone) [phone] (Fax)      Agent: Please be advised that RX refills may take up to 3 business days. We ask that you follow-up with your pharmacy.

## 2018-02-08 NOTE — Telephone Encounter (Signed)
Last filled 11/16/17 # 90 refills 0  Last OV 07/2017

## 2018-02-08 NOTE — Telephone Encounter (Signed)
Meloxicam already pended to provider.

## 2018-03-05 DIAGNOSIS — N390 Urinary tract infection, site not specified: Secondary | ICD-10-CM | POA: Diagnosis not present

## 2018-04-08 DIAGNOSIS — R69 Illness, unspecified: Secondary | ICD-10-CM | POA: Diagnosis not present

## 2018-04-28 ENCOUNTER — Other Ambulatory Visit: Payer: Self-pay | Admitting: Family Medicine

## 2018-05-05 DIAGNOSIS — N390 Urinary tract infection, site not specified: Secondary | ICD-10-CM | POA: Diagnosis not present

## 2018-05-09 ENCOUNTER — Other Ambulatory Visit: Payer: Self-pay | Admitting: Family Medicine

## 2018-05-09 DIAGNOSIS — I10 Essential (primary) hypertension: Secondary | ICD-10-CM

## 2018-05-09 DIAGNOSIS — R7303 Prediabetes: Secondary | ICD-10-CM

## 2018-05-10 ENCOUNTER — Ambulatory Visit (INDEPENDENT_AMBULATORY_CARE_PROVIDER_SITE_OTHER): Payer: Medicare HMO

## 2018-05-10 VITALS — BP 122/82 | HR 66 | Temp 97.8°F | Ht 65.0 in | Wt 186.8 lb

## 2018-05-10 DIAGNOSIS — R7303 Prediabetes: Secondary | ICD-10-CM

## 2018-05-10 DIAGNOSIS — I10 Essential (primary) hypertension: Secondary | ICD-10-CM

## 2018-05-10 DIAGNOSIS — Z0000 Encounter for general adult medical examination without abnormal findings: Secondary | ICD-10-CM

## 2018-05-10 LAB — LIPID PANEL
CHOL/HDL RATIO: 3
CHOLESTEROL: 175 mg/dL (ref 0–200)
HDL: 52.3 mg/dL (ref >39.00)
LDL Cholesterol: 93 mg/dL (ref 0–99)
NonHDL: 122.87
TRIGLYCERIDES: 148 mg/dL (ref 0.0–149.0)
VLDL: 29.6 mg/dL (ref 0.0–40.0)

## 2018-05-10 LAB — COMPREHENSIVE METABOLIC PANEL
ALBUMIN: 4.2 g/dL (ref 3.5–5.2)
ALT: 20 U/L (ref 0–35)
AST: 23 U/L (ref 0–37)
Alkaline Phosphatase: 81 U/L (ref 39–117)
BILIRUBIN TOTAL: 0.7 mg/dL (ref 0.2–1.2)
BUN: 17 mg/dL (ref 6–23)
CALCIUM: 9.7 mg/dL (ref 8.4–10.5)
CHLORIDE: 102 meq/L (ref 96–112)
CO2: 29 meq/L (ref 19–32)
CREATININE: 0.9 mg/dL (ref 0.40–1.20)
GFR: 65.92 mL/min (ref >60.00)
Glucose, Bld: 116 mg/dL — ABNORMAL HIGH (ref 70–99)
Potassium: 4.2 mEq/L (ref 3.5–5.1)
SODIUM: 139 meq/L (ref 135–145)
Total Protein: 6.8 g/dL (ref 6.0–8.3)

## 2018-05-10 LAB — HEMOGLOBIN A1C: Hgb A1c MFr Bld: 6.1 % (ref 4.6–6.5)

## 2018-05-10 LAB — TSH: TSH: 1.9 u[IU]/mL (ref 0.35–4.50)

## 2018-05-10 NOTE — Progress Notes (Signed)
Subjective:   Brittney Pierce is a 67 y.o. female who presents for Medicare Annual (Subsequent) preventive examination.  Review of Systems:  N/A Cardiac Risk Factors include: advanced age (>7mn, >>20women);hypertension;obesity (BMI >30kg/m2)     Objective:     Vitals: BP 122/82 (BP Location: Right Arm, Patient Position: Sitting, Cuff Size: Normal)   Pulse 66   Temp 97.8 F (36.6 C) (Oral)   Ht 5' 5"  (1.651 m)   Wt 186 lb 12 oz (84.7 kg)   SpO2 95%   BMI 31.08 kg/m   Body mass index is 31.08 kg/m.  Advanced Directives 05/10/2018 05/04/2017  Does Patient Have a Medical Advance Directive? Yes Yes  Type of AParamedicof ABabbieLiving will HEvening ShadeLiving will  Copy of HDickeyin Chart? No - copy requested No - copy requested    Tobacco Social History   Tobacco Use  Smoking Status Former Smoker  . Last attempt to quit: 07/27/1980  . Years since quitting: 37.8  Smokeless Tobacco Never Used     Counseling given: No   Clinical Intake:  Pre-visit preparation completed: Yes  Pain : No/denies pain Pain Score: 0-No pain     Nutritional Status: BMI 25 -29 Overweight Nutritional Risks: None Diabetes: No  How often do you need to have someone help you when you read instructions, pamphlets, or other written materials from your doctor or pharmacy?: 1 - Never What is the last grade level you completed in school?: 12th grade + 3 yrs college  Interpreter Needed?: No  Comments: patient is a widow and lives alone Information entered by :: LPinson, LPN  Past Medical History:  Diagnosis Date  . Disorder of bone and cartilage, unspecified   . Dysuria   . Esophageal reflux   . Lumbago   . Osteoarthrosis, unspecified whether generalized or localized, unspecified site   . Ulcerative colitis, unspecified   . Unspecified essential hypertension    Past Surgical History:  Procedure Laterality Date  .  CESAREAN SECTION     x2  . CHOLECYSTECTOMY    [place]   Family History  Problem Relation Age of Onset  . Heart attack Father 765 . Diabetes Father   . Lung cancer Mother    Social History   Socioeconomic History  . Marital status: Married    Spouse name: Not on file  . Number of children: Not on file  . Years of education: Not on file  . Highest education level: Not on file  Occupational History  . Occupation: Retired  SScientific laboratory technician . Financial resource strain: Not on file  . Food insecurity:    Worry: Not on file    Inability: Not on file  . Transportation needs:    Medical: Not on file    Non-medical: Not on file  Tobacco Use  . Smoking status: Former Smoker    Last attempt to quit: 07/27/1980    Years since quitting: 37.8  . Smokeless tobacco: Never Used  Substance and Sexual Activity  . Alcohol use: Yes    Comment: Once a month (maybe)  . Drug use: No  . Sexual activity: Not Currently  Lifestyle  . Physical activity:    Days per week: Not on file    Minutes per session: Not on file  . Stress: Not on file  Relationships  . Social connections:    Talks on phone: Not on file  Gets together: Not on file    Attends religious service: Not on file    Active member of club or organization: Not on file    Attends meetings of clubs or organizations: Not on file    Relationship status: Not on file  Other Topics Concern  . Not on file  Social History Narrative   Regular exercise-yes      Moved here from [facility] to be closer to grandchildren      Likes to garden      Does have a living will.  Husband is HPOA.   Would desire life support, no prolonged life support if futile.             Outpatient Encounter Medications as of 05/10/2018  Medication Sig  . ALPRAZolam (XANAX) 0.25 MG tablet Take 1 tablet (0.25 mg total) by mouth 2 (two) times daily as needed for anxiety.  Marland Kitchen BIOTIN PO Take 1 capsule by mouth daily.  . Calcium 1200-1000 MG-UNIT  CHEW Chew 1 tablet by mouth daily.    . cetirizine (ZYRTEC) 10 MG tablet Take 10 mg by mouth daily.    . diclofenac sodium (VOLTAREN) 1 % GEL Apply topically as needed.   . Emollient (COLLAGEN EX) Apply topically.  . fluticasone (FLONASE) 50 MCG/ACT nasal spray 2 sprays each nostril as directed.  . hydrocortisone (ANUSOL-HC) 25 MG suppository Place 1 suppository (25 mg total) rectally 2 (two) times daily as needed.  . meloxicam (MOBIC) 15 MG tablet TAKE 1 TABLET DAILY  . mesalamine (CANASA) 1000 MG suppository Place 1,000 mg rectally at bedtime as needed.  . metoprolol tartrate (LOPRESSOR) 25 MG tablet TAKE 1/2 TABLET TWICE A DAY(SUBSTITUTED FOR LOPRESSOR)  . Multiple Vitamins-Minerals (MULTIVITAMIN PO) Take 1 tablet by mouth daily.  Marland Kitchen omeprazole (PRILOSEC) 40 MG capsule TAKE 1 CAPSULE DAILY  . POTASSIUM GLUCONATE PO Take 1 capsule by mouth daily.  . Probiotic Product (PROBIOTIC DAILY PO) Take by mouth.  . [DISCONTINUED] ciprofloxacin (CIPRO) 250 MG tablet Take 1 tablet (250 mg total) 2 (two) times daily by mouth.  . [DISCONTINUED] cyanocobalamin 500 MCG tablet Take 500 mcg by mouth daily.   No facility-administered encounter medications on file as of 05/10/2018.     Activities of Daily Living In your present state of health, do you have any difficulty performing the following activities: 05/10/2018  Hearing? N  Vision? N  Difficulty concentrating or making decisions? N  Walking or climbing stairs? N  Dressing or bathing? N  Doing errands, shopping? N  Preparing Food and eating ? N  Using the Toilet? N  In the past six months, have you accidently leaked urine? N  Do you have problems with loss of bowel control? N  Managing your Medications? N  Managing your Finances? N  Housekeeping or managing your Housekeeping? N  Some recent data might be hidden    Patient Care Team: Elby Beck, FNP as PCP - General (Nurse Practitioner) Loren Racer, PA-C (Inactive) as Consulting  Physician (Gastroenterology) Jannet Mantis, MD as Consulting Physician (Dermatology) Roosvelt Harps., OD as Consulting Physician (Optometry)    Assessment:   This is a routine wellness examination for [place].   Hearing Screening   125Hz  250Hz  500Hz  1000Hz  2000Hz  3000Hz  4000Hz  6000Hz  8000Hz   Right ear:   40 40 40  40    Left ear:   40 40 40  40    Vision Screening Comments: Vision exam in May 2019 with Dr. Thelma Comp  Exercise Activities and Dietary recommendations Current Exercise Habits: Home exercise routine, Type of exercise: walking, Time (Minutes): 60, Frequency (Times/Week): 3, Weekly Exercise (Minutes/Week): 180, Intensity: Moderate, Exercise limited by: None identified  Goals    . Increase physical activity     Starting 05/10/2018, I will continue to exercise for at least 60 minutes 3 days per week.        Fall Risk Fall Risk  05/10/2018 05/04/2017 04/28/2016 03/20/2015 02/13/2014  Falls in the past year? No No No No No   Depression Screen PHQ 2/9 Scores 05/10/2018 05/04/2017 04/28/2016 04/28/2016  PHQ - 2 Score 0 0 1 1  PHQ- 9 Score 0 0 - -     Cognitive Function MMSE - Mini Mental State Exam 05/10/2018 05/04/2017  Orientation to time 5 5  Orientation to Place 5 5  Registration 3 3  Attention/ Calculation 0 0  Recall 3 3  Language- name 2 objects 0 0  Language- repeat 1 1  Language- follow 3 step command 3 3  Language- read & follow direction 0 0  Write a sentence 0 0  Copy design 0 0  Total score 20 20     PLEASE NOTE: A Mini-Cog screen was completed. Maximum score is 20. A value of 0 denotes this part of Folstein MMSE was not completed or the patient failed this part of the Mini-Cog screening.   Mini-Cog Screening Orientation to Time - Max 5 pts Orientation to Place - Max 5 pts Registration - Max 3 pts Recall - Max 3 pts Language Repeat - Max 1 pts Language Follow 3 Step Command - Max 3 pts     Immunization History  Administered  Date(s) Administered  . Influenza Split 05/12/2011  . Influenza Whole 04/29/2010  . Influenza, High Dose Seasonal PF 04/30/2017, 04/08/2018  . Influenza,inj,Quad PF,6+ Mos 04/25/2013, 05/07/2014, 03/20/2015  . Pneumococcal Conjugate-13 03/20/2015  . Pneumococcal Polysaccharide-23 02/13/2014  . Zoster 04/07/2011    Screening Tests Health Maintenance  Topic Date Due  . COLONOSCOPY  08/07/2018  . MAMMOGRAM  08/28/2019  . INFLUENZA VACCINE  Completed  . DEXA SCAN  Completed  . Hepatitis C Screening  Completed  . PNA vac Low Risk Adult  Completed  . TETANUS/TDAP  Discontinued      Plan:     I have personally reviewed, addressed, and noted the following in the patient's chart:  A. Medical and social history B. Use of alcohol, tobacco or illicit drugs  C. Current medications and supplements D. Functional ability and status E.  Nutritional status F.  Physical activity G. Advance directives H. List of other physicians I.  Hospitalizations, surgeries, and ER visits in previous 12 months J.  [place] to include hearing, vision, cognitive, depression L. Referrals and appointments - none  In addition, I have reviewed and discussed with patient certain preventive protocols, quality metrics, and best practice recommendations. A written personalized care plan for preventive services as well as general preventive health recommendations were provided to patient.  See attached scanned questionnaire for additional information.   Signed,   Lindell Noe, MHA, BS, LPN Health Coach

## 2018-05-10 NOTE — Progress Notes (Signed)
PCP notes:   Health maintenance:  No gaps identified.   Abnormal screenings:   None  Patient concerns:   None  Nurse concerns:  None  Next PCP appt:   05/20/18 @ 1030  I reviewed health advisor's note, was available for consultation on the day of service listed in this note, and agree with documentation and plan. Elsie Stain, MD.

## 2018-05-10 NOTE — Patient Instructions (Signed)
Brittney Pierce , Thank you for taking time to come for your Medicare Wellness Visit. I appreciate your ongoing commitment to your health goals. Please review the following plan we discussed and let me know if I can assist you in the future.   These are the goals we discussed: Goals    . Increase physical activity     Starting 05/10/2018, I will continue to exercise for at least 60 minutes 3 days per week.        This is a list of the screening recommended for you and due dates:  Health Maintenance  Topic Date Due  . Colon Cancer Screening  08/07/2018  . Mammogram  08/28/2019  . Flu Shot  Completed  . DEXA scan (bone density measurement)  Completed  .  Hepatitis C: One time screening is recommended by Center for Disease Control  (CDC) for  adults born from [birth year] through 1967.   Completed  . Pneumonia vaccines  Completed  . Tetanus Vaccine  Discontinued   Preventive Care for Adults  A healthy lifestyle and preventive care can promote health and wellness. Preventive health guidelines for adults include the following key practices.  . A routine yearly physical is a good way to check with your health care provider about your health and preventive screening. It is a chance to share any concerns and updates on your health and to receive a thorough exam.  . Visit your dentist for a routine exam and preventive care every 6 months. Brush your teeth twice a day and floss once a day. Good oral hygiene prevents tooth decay and gum disease.  . The frequency of eye exams is based on your age, health, family medical history, use  of contact lenses, and other factors. Follow your health care provider's recommendations for frequency of eye exams.  . Eat a healthy diet. Foods like vegetables, fruits, whole grains, low-fat dairy products, and lean protein foods contain the nutrients you need without too many calories. Decrease your intake of foods high in solid fats, added sugars, and salt. Eat the  right amount of calories for you. Get information about a proper diet from your health care provider, if necessary.  . Regular physical exercise is one of the most important things you can do for your health. Most adults should get at least 150 minutes of moderate-intensity exercise (any activity that increases your heart rate and causes you to sweat) each week. In addition, most adults need muscle-strengthening exercises on 2 or more days a week.  Silver Sneakers may be a benefit available to you. To determine eligibility, you may visit the website: www.silversneakers.com or contact program at 205-782-2399 Mon-Fri between 8AM-8PM.   . Maintain a healthy weight. The body mass index (BMI) is a screening tool to identify possible weight problems. It provides an estimate of body fat based on height and weight. Your health care provider can find your BMI and can help you achieve or maintain a healthy weight.   For adults 20 years and older: ? A BMI below 18.5 is considered underweight. ? A BMI of 18.5 to 24.9 is normal. ? A BMI of 25 to 29.9 is considered overweight. ? A BMI of 30 and above is considered obese.   . Maintain normal blood lipids and cholesterol levels by exercising and minimizing your intake of saturated fat. Eat a balanced diet with plenty of fruit and vegetables. Blood tests for lipids and cholesterol should begin at age 34 and be repeated every  5 years. If your lipid or cholesterol levels are high, you are over 50, or you are at high risk for heart disease, you may need your cholesterol levels checked more frequently. Ongoing high lipid and cholesterol levels should be treated with medicines if diet and exercise are not working.  . If you smoke, find out from your health care provider how to quit. If you do not use tobacco, please do not start.  . If you choose to drink alcohol, please do not consume more than 2 drinks per day. One drink is considered to be 12 ounces (355 mL) of  beer, 5 ounces (148 mL) of wine, or 1.5 ounces (44 mL) of liquor.  . If you are 37-41 years old, ask your health care provider if you should take aspirin to prevent strokes.  . Use sunscreen. Apply sunscreen liberally and repeatedly throughout the day. You should seek shade when your shadow is shorter than you. Protect yourself by wearing long sleeves, pants, a wide-brimmed hat, and sunglasses year round, whenever you are outdoors.  . Once a month, do a whole body skin exam, using a mirror to look at the skin on your back. Tell your health care provider of new moles, moles that have irregular borders, moles that are larger than a pencil eraser, or moles that have changed in shape or color.

## 2018-05-20 ENCOUNTER — Other Ambulatory Visit: Payer: Self-pay | Admitting: Family Medicine

## 2018-05-20 ENCOUNTER — Encounter: Payer: Self-pay | Admitting: Family Medicine

## 2018-05-20 ENCOUNTER — Ambulatory Visit (INDEPENDENT_AMBULATORY_CARE_PROVIDER_SITE_OTHER): Payer: Medicare HMO | Admitting: Family Medicine

## 2018-05-20 VITALS — BP 132/80 | HR 76 | Ht 65.0 in | Wt 189.8 lb

## 2018-05-20 DIAGNOSIS — N3090 Cystitis, unspecified without hematuria: Secondary | ICD-10-CM

## 2018-05-20 DIAGNOSIS — R7303 Prediabetes: Secondary | ICD-10-CM

## 2018-05-20 DIAGNOSIS — Z0000 Encounter for general adult medical examination without abnormal findings: Secondary | ICD-10-CM | POA: Diagnosis not present

## 2018-05-20 DIAGNOSIS — K219 Gastro-esophageal reflux disease without esophagitis: Secondary | ICD-10-CM

## 2018-05-20 DIAGNOSIS — M545 Low back pain, unspecified: Secondary | ICD-10-CM

## 2018-05-20 DIAGNOSIS — G8929 Other chronic pain: Secondary | ICD-10-CM

## 2018-05-20 DIAGNOSIS — R350 Frequency of micturition: Secondary | ICD-10-CM

## 2018-05-20 LAB — POCT URINALYSIS DIPSTICK
BILIRUBIN UA: NEGATIVE
Glucose, UA: NEGATIVE
Ketones, UA: NEGATIVE
Nitrite, UA: NEGATIVE
Protein, UA: NEGATIVE
Spec Grav, UA: 1.01 (ref 1.010–1.025)
Urobilinogen, UA: 0.2 E.U./dL
pH, UA: 6 (ref 5.0–8.0)

## 2018-05-20 MED ORDER — OMEPRAZOLE 40 MG PO CPDR: 40.0000 mg | DELAYED_RELEASE_CAPSULE | Freq: Every day | ORAL | 3 refills | Status: DC

## 2018-05-20 MED ORDER — MELOXICAM 15 MG PO TABS: 15.0000 mg | ORAL_TABLET | Freq: Every day | ORAL | 1 refills | Status: DC

## 2018-05-20 MED ORDER — SULFAMETHOXAZOLE-TRIMETHOPRIM 800-160 MG PO TABS: 1.0000 | ORAL_TABLET | Freq: Two times a day (BID) | ORAL | 0 refills | Status: DC

## 2018-05-20 NOTE — Patient Instructions (Signed)
Good luck with your retirement- try to schedule sleep, meals and exercise  Please follow up in 6 months   Preventive Care 65 Years and Older, Female Preventive care refers to lifestyle choices and visits with your health care provider that can promote health and wellness. What does preventive care include?  A yearly physical exam. This is also called an annual well check.  Dental exams once or twice a year.  Routine eye exams. Ask your health care provider how often you should have your eyes checked.  Personal lifestyle choices, including: ? Daily care of your teeth and gums. ? Regular physical activity. ? Eating a healthy diet. ? Avoiding tobacco and drug use. ? Limiting alcohol use. ? Practicing safe sex. ? Taking low-dose aspirin every day. ? Taking vitamin and mineral supplements as recommended by your health care provider. What happens during an annual well check? The services and screenings done by your health care provider during your annual well check will depend on your age, overall health, lifestyle risk factors, and family history of disease. Counseling Your health care provider may ask you questions about your:  Alcohol use.  Tobacco use.  Drug use.  Emotional well-being.  Home and relationship well-being.  Sexual activity.  Eating habits.  History of falls.  Memory and ability to understand (cognition).  Work and work Statistician.  Reproductive health.  Screening You may have the following tests or measurements:  Height, weight, and BMI.  Blood pressure.  Lipid and cholesterol levels. These may be checked every 5 years, or more frequently if you are over 25 years old.  Skin check.  Lung cancer screening. You may have this screening every year starting at age 75 if you have a 30-pack-year history of smoking and currently smoke or have quit within the past 15 years.  Fecal occult blood test (FOBT) of the stool. You may have this test every  year starting at age 15.  Flexible sigmoidoscopy or colonoscopy. You may have a sigmoidoscopy every 5 years or a colonoscopy every 10 years starting at age 86.  Hepatitis C blood test.  Hepatitis B blood test.  Sexually transmitted disease (STD) testing.  Diabetes screening. This is done by checking your blood sugar (glucose) after you have not eaten for a while (fasting). You may have this done every 1-3 years.  Bone density scan. This is done to screen for osteoporosis. You may have this done starting at age 15.  Mammogram. This may be done every 1-2 years. Talk to your health care provider about how often you should have regular mammograms.  Talk with your health care provider about your test results, treatment options, and if necessary, the need for more tests. Vaccines Your health care provider may recommend certain vaccines, such as:  Influenza vaccine. This is recommended every year.  Tetanus, diphtheria, and acellular pertussis (Tdap, Td) vaccine. You may need a Td booster every 10 years.  Varicella vaccine. You may need this if you have not been vaccinated.  Zoster vaccine. You may need this after age 1.  Measles, mumps, and rubella (MMR) vaccine. You may need at least one dose of MMR if you were born in 1957 or later. You may also need a second dose.  Pneumococcal 13-valent conjugate (PCV13) vaccine. One dose is recommended after age 85.  Pneumococcal polysaccharide (PPSV23) vaccine. One dose is recommended after age 83.  Meningococcal vaccine. You may need this if you have certain conditions.  Hepatitis A vaccine. You may need  this if you have certain conditions or if you travel or work in places where you may be exposed to hepatitis A.  Hepatitis B vaccine. You may need this if you have certain conditions or if you travel or work in places where you may be exposed to hepatitis B.  Haemophilus influenzae type b (Hib) vaccine. You may need this if you have certain  conditions.  Talk to your health care provider about which screenings and vaccines you need and how often you need them. This information is not intended to replace advice given to you by your health care provider. Make sure you discuss any questions you have with your health care provider. Document Released: 08/09/2015 Document Revised: 04/01/2016 Document Reviewed: 05/14/2015 Elsevier Interactive Patient Education  [employer].

## 2018-05-20 NOTE — Progress Notes (Signed)
Subjective:    Patient ID: Brittney Pierce, female    DOB: Sep 23, 1950, 67 y.o.   MRN: [id number]  HPI This is a 67 yo female who presents today for CPE. Had AWV 05/10/18.   Last CPE- 05/11/17 Mammo- 08/27/2017 Pap- 04/18/2013- [facility]- 08/07/2008 Tdap- unknown Flu- annual Eye- regular Dental- regular Exercise-  Dysuria- x 3 days, urgency, frequency, no nausea or vomiting, no unusual back pain.   Past Medical History:  Diagnosis Date  . Disorder of bone and cartilage, unspecified   . Dysuria   . Esophageal reflux   . Lumbago   . Osteoarthrosis, unspecified whether generalized or localized, unspecified site   . Ulcerative colitis, unspecified   . Unspecified essential hypertension    Past Surgical History:  Procedure Laterality Date  . CESAREAN SECTION     x2  . CHOLECYSTECTOMY    . [place] SURGERY  2008   Family History  Problem Relation Age of Onset  . Heart attack Father 72  . Diabetes Father   . Lung cancer Mother    Social History   Tobacco Use  . Smoking status: Former Smoker    Last attempt to quit: 07/27/1980    Years since quitting: 37.8  . Smokeless tobacco: Never Used  Substance Use Topics  . Alcohol use: Yes    Comment: Once a month (maybe)  . Drug use: No      Review of Systems  Constitutional: Negative.   HENT: Negative.   Eyes: Negative.   Respiratory: Positive for shortness of breath (climbing multiple floors of stairs or going uphill).   Cardiovascular: Negative.   Gastrointestinal: Positive for abdominal pain (reflux some nights, has head of bed raised, taking omeprazole, worse with late eating/snacking).  Endocrine: Negative.   Genitourinary: Positive for dysuria (x 3 days) and frequency.  Musculoskeletal: Positive for back pain (good relief with meloxicam).  Skin: Negative.   Allergic/Immunologic: Negative.   Neurological: Negative.   Hematological: Negative.   Psychiatric/Behavioral: Negative.          Objective:   Physical Exam Physical Exam  Constitutional: She is oriented to person, place, and time. She appears well-developed and well-nourished. No distress.  HENT:  Head: Normocephalic and atraumatic.  Right Ear: External ear normal.  Left Ear: External ear normal.  Nose: Nose normal.  Mouth/Throat: Oropharynx is clear and moist. No oropharyngeal exudate.  Eyes: Conjunctivae are normal. Pupils are equal, round, and reactive to light.  Neck: Normal range of motion. Neck supple. No JVD present. No thyromegaly present.  Cardiovascular: Normal rate, regular rhythm, normal heart sounds and intact distal pulses.   Pulmonary/Chest: Effort normal and breath sounds normal. Right breast exhibits no inverted nipple, no mass, no nipple discharge, no skin change and no tenderness. Left breast exhibits no inverted nipple, no mass, no nipple discharge, no skin change and no tenderness. Breasts are symmetrical.  Abdominal: Soft. Bowel sounds are normal. She exhibits no distension and no mass. There is no tenderness. There is no rebound and no guarding.  Musculoskeletal: Normal range of motion. She exhibits no edema or tenderness.  Lymphadenopathy:    She has no cervical adenopathy.  Neurological: She is alert and oriented to person, place, and time. She has normal reflexes.  Skin: Skin is warm and dry. She is not diaphoretic.  Psychiatric: She has a normal mood and affect. Her behavior is normal. Judgment and thought content normal.  Vitals reviewed.     Wt Readings from Last 3 Encounters:  05/10/18 186 lb 12 oz (84.7 kg)  07/30/17 186 lb (84.4 kg)  06/10/17 185 lb (83.9 kg)   Depression screen [facility] 2/9 05/10/2018 05/04/2017 04/28/2016 04/28/2016 03/20/2015  Decreased Interest 0 0 0 0 0  Down, Depressed, Hopeless 0 0 1 1 0  PHQ - 2 Score 0 0 1 1 0  Altered sleeping 0 0 - - -  Tired, decreased energy 0 0 - - -  Change in appetite 0 0 - - -  Feeling bad or failure about yourself  0 0 - - -   Trouble concentrating 0 0 - - -  Moving slowly or fidgety/restless 0 0 - - -  Suicidal thoughts 0 0 - - -  PHQ-9 Score 0 0 - - -  Difficult doing work/chores Not difficult at all Not difficult at all - - -   Results for orders placed or performed in visit on 05/20/18  POCT urinalysis dipstick  Result Value Ref Range   Color, UA orange    Clarity, UA clear    Glucose, UA Negative Negative   Bilirubin, UA neg    Ketones, UA neg    Spec Grav, UA 1.010 1.010 - 1.025   Blood, UA 1+    pH, UA 6.0 5.0 - 8.0   Protein, UA Negative Negative   Urobilinogen, UA 0.2 0.2 or 1.0 E.U./dL   Nitrite, UA neg    Leukocytes, UA Large (3+) (A) Negative   Appearance     Odor         Assessment & Plan:  1. Annual physical exam - reviewed labs - - Discussed and encouraged healthy lifestyle choices- adequate sleep, regular exercise, stress management and healthy food choices.    2. Urine frequency - POCT urinalysis dipstick  3. Cystitis - Provided written and verbal information regarding diagnosis and treatment. - sulfamethoxazole-trimethoprim (BACTRIM DS,SEPTRA DS) 800-160 MG tablet; Take 1 tablet by mouth 2 (two) times daily.  Dispense: 10 tablet; Refill: 0  4. Chronic midline low back pain without sciatica - discussed exercise and weight loss - meloxicam (MOBIC) 15 MG tablet; Take 1 tablet (15 mg total) by mouth daily.  Dispense: 90 tablet; Refill: 1  5. Gastroesophageal reflux disease, esophagitis presence not specified - encouraged her to avoid eating within several hours of bedtime and avoid known triggers - omeprazole (PRILOSEC) 40 MG capsule; Take 1 capsule (40 mg total) by mouth daily.  Dispense: 90 capsule; Refill: 3  6. Prediabetes - discussed exercise and healthy food choices  - follow up GERD, arthritis, retirement in 6 months   Clarene Reamer, FNP-BC  [facility] at [facility], [place]  05/20/2018 4:45 PM

## 2018-08-01 ENCOUNTER — Other Ambulatory Visit: Payer: Self-pay | Admitting: Family Medicine

## 2018-09-09 ENCOUNTER — Telehealth: Payer: Self-pay | Admitting: Family Medicine

## 2018-09-09 NOTE — Telephone Encounter (Signed)
Please call patient and let her know that, according to the instructions on the back of the summons, she does not qualify for a medical excuse.

## 2018-09-09 NOTE — Telephone Encounter (Signed)
Placed in [place] review box.

## 2018-09-09 NOTE — Telephone Encounter (Signed)
Called pt to let her know that we will not be able to excuse her from jury duty. Left form in yellow folder up front for her to pick up.

## 2018-09-09 NOTE — Telephone Encounter (Signed)
Pt dropped off form to be excused from jury duty. Placed in [place] tower.

## 2018-09-22 DIAGNOSIS — L578 Other skin changes due to chronic exposure to nonionizing radiation: Secondary | ICD-10-CM | POA: Diagnosis not present

## 2018-09-22 DIAGNOSIS — L814 Other melanin hyperpigmentation: Secondary | ICD-10-CM | POA: Diagnosis not present

## 2018-09-22 DIAGNOSIS — Z85828 Personal history of other malignant neoplasm of skin: Secondary | ICD-10-CM | POA: Diagnosis not present

## 2018-09-22 DIAGNOSIS — L570 Actinic keratosis: Secondary | ICD-10-CM | POA: Diagnosis not present

## 2018-10-31 ENCOUNTER — Other Ambulatory Visit: Payer: Self-pay | Admitting: *Deleted

## 2018-10-31 DIAGNOSIS — M545 Low back pain: Principal | ICD-10-CM

## 2018-10-31 DIAGNOSIS — G8929 Other chronic pain: Secondary | ICD-10-CM

## 2018-10-31 MED ORDER — MELOXICAM 15 MG PO TABS: 15.0000 mg | ORAL_TABLET | Freq: Every day | ORAL | 0 refills | Status: DC

## 2018-10-31 NOTE — Telephone Encounter (Signed)
Last office visit 05/20/2018 for CPE.  Last refilled 05/20/2018 for #90 with 1 refill.  CPE scheduled for 05/22/2019,

## 2018-12-15 DIAGNOSIS — I10 Essential (primary) hypertension: Secondary | ICD-10-CM | POA: Diagnosis not present

## 2018-12-15 DIAGNOSIS — M549 Dorsalgia, unspecified: Secondary | ICD-10-CM | POA: Diagnosis not present

## 2018-12-15 DIAGNOSIS — Z6832 Body mass index (BMI) 32.0-32.9, adult: Secondary | ICD-10-CM | POA: Diagnosis not present

## 2018-12-15 DIAGNOSIS — M545 Low back pain: Secondary | ICD-10-CM | POA: Diagnosis not present

## 2018-12-22 ENCOUNTER — Other Ambulatory Visit: Payer: Self-pay | Admitting: Orthopaedic Surgery

## 2018-12-22 DIAGNOSIS — M5136 Other intervertebral disc degeneration, lumbar region: Secondary | ICD-10-CM

## 2018-12-23 ENCOUNTER — Other Ambulatory Visit: Payer: Self-pay | Admitting: Orthopaedic Surgery

## 2018-12-23 DIAGNOSIS — Z1231 Encounter for screening mammogram for malignant neoplasm of breast: Secondary | ICD-10-CM

## 2018-12-23 DIAGNOSIS — M8580 Other specified disorders of bone density and structure, unspecified site: Secondary | ICD-10-CM

## 2018-12-28 ENCOUNTER — Other Ambulatory Visit: Payer: Self-pay

## 2018-12-28 ENCOUNTER — Ambulatory Visit
Admission: RE | Admit: 2018-12-28 | Discharge: 2018-12-28 | Disposition: A | Discharge status: Home / Self Care | Payer: Medicare HMO | Source: Ambulatory Visit | Attending: Orthopaedic Surgery | Admitting: Orthopaedic Surgery

## 2018-12-28 DIAGNOSIS — M810 Age-related osteoporosis without current pathological fracture: Secondary | ICD-10-CM | POA: Diagnosis not present

## 2018-12-28 DIAGNOSIS — Z780 Asymptomatic menopausal state: Secondary | ICD-10-CM | POA: Diagnosis not present

## 2018-12-28 DIAGNOSIS — Z1231 Encounter for screening mammogram for malignant neoplasm of breast: Secondary | ICD-10-CM | POA: Diagnosis not present

## 2018-12-28 DIAGNOSIS — M85832 Other specified disorders of bone density and structure, left forearm: Secondary | ICD-10-CM | POA: Diagnosis not present

## 2018-12-28 DIAGNOSIS — M8580 Other specified disorders of bone density and structure, unspecified site: Secondary | ICD-10-CM

## 2019-01-13 ENCOUNTER — Ambulatory Visit
Admission: RE | Admit: 2019-01-13 | Discharge: 2019-01-13 | Disposition: A | Discharge status: Home / Self Care | Payer: Medicare HMO | Source: Ambulatory Visit | Attending: Orthopaedic Surgery | Admitting: Orthopaedic Surgery

## 2019-01-13 DIAGNOSIS — M5136 Other intervertebral disc degeneration, lumbar region: Secondary | ICD-10-CM

## 2019-01-28 ENCOUNTER — Other Ambulatory Visit: Payer: Self-pay | Admitting: Family Medicine

## 2019-01-28 DIAGNOSIS — G8929 Other chronic pain: Secondary | ICD-10-CM

## 2019-02-08 DIAGNOSIS — I10 Essential (primary) hypertension: Secondary | ICD-10-CM | POA: Diagnosis not present

## 2019-02-08 DIAGNOSIS — Z6832 Body mass index (BMI) 32.0-32.9, adult: Secondary | ICD-10-CM | POA: Diagnosis not present

## 2019-02-08 DIAGNOSIS — M419 Scoliosis, unspecified: Secondary | ICD-10-CM | POA: Diagnosis not present

## 2019-02-10 ENCOUNTER — Telehealth: Payer: Self-pay

## 2019-02-10 NOTE — Telephone Encounter (Addendum)
Pt left v/m requesting cb to see if Glenda Chroman FNP got note from Dr Patrice Paradise the spine and scoliosis doctor to see if D Carlean Purl would fill rx for Forteo. I spoke with pt and she read the note she had from Dr Patrice Paradise which read pt has Dx of scoliosis M41.9 pt has severe degenerative scoliosis that would benefit from surgery but at this point osteoporosis is too severe to hold the instrumentation. Would D Gessner FNP consider starting pt on Forteo for 6 mths to a year to improve bone quality so that surgery would be an option. Regardless of surgery this would help prevent scoliosis from getting worse.Please advise. Pt request cb after Glenda Chroman FNP has reviewed. Bone density report and MRI are under pts imaging tab for review if needed.

## 2019-02-13 NOTE — Telephone Encounter (Signed)
Please call patient and tell her that this is not something that I prescribe but that I am happy to refer her to a specialist.

## 2019-02-14 NOTE — Telephone Encounter (Signed)
Spoke with patient. Patient wanted to know first what specialist would that be?  I did ask patient if Brittney Pierce told her that he would not write this prescription but patient said what doctor said was in similar words as follows " He wanted to be in correlation with the patient's provider" basically. Patient is not sure if that was a code for he will not write this prescription.  I went ahead and called Brittney Pierce office to check. Message was taking for the doctor since he was not there today and they will call me back tomorrow. Per his PA they usually have PCP prescribe and follow this medication. Advised of Brittney Pierce's comments and asked if they could for this patient since we will not be able to.  Awaiting response

## 2019-02-15 ENCOUNTER — Other Ambulatory Visit: Payer: Self-pay | Admitting: Family Medicine

## 2019-02-15 DIAGNOSIS — M810 Age-related osteoporosis without current pathological fracture: Secondary | ICD-10-CM

## 2019-02-15 NOTE — Telephone Encounter (Signed)
Please call the patient and tell her that this is a daily injectable medication and I would send her to an endocrine specialist. She would need some additional work up prior to starting this medication.

## 2019-02-15 NOTE — Progress Notes (Signed)
Referred to endocrine for Forteo

## 2019-02-15 NOTE — Telephone Encounter (Signed)
Referral placed.

## 2019-02-15 NOTE — Telephone Encounter (Signed)
Patient advised. Patient agreeable with referral. Would like to go to [place].

## 2019-03-02 DIAGNOSIS — M47816 Spondylosis without myelopathy or radiculopathy, lumbar region: Secondary | ICD-10-CM | POA: Diagnosis not present

## 2019-03-02 DIAGNOSIS — M419 Scoliosis, unspecified: Secondary | ICD-10-CM | POA: Diagnosis not present

## 2019-03-03 ENCOUNTER — Other Ambulatory Visit: Payer: Self-pay

## 2019-03-07 ENCOUNTER — Encounter: Payer: Self-pay | Admitting: Internal Medicine

## 2019-03-07 ENCOUNTER — Ambulatory Visit (INDEPENDENT_AMBULATORY_CARE_PROVIDER_SITE_OTHER): Payer: Medicare HMO | Admitting: Internal Medicine

## 2019-03-07 ENCOUNTER — Other Ambulatory Visit: Payer: Self-pay

## 2019-03-07 VITALS — BP 118/72 | HR 84 | Temp 98.2°F | Ht 65.0 in | Wt 194.6 lb

## 2019-03-07 DIAGNOSIS — M810 Age-related osteoporosis without current pathological fracture: Secondary | ICD-10-CM

## 2019-03-07 LAB — COMPREHENSIVE METABOLIC PANEL
ALT: 30 U/L (ref 0–35)
AST: 32 U/L (ref 0–37)
Albumin: 4.2 g/dL (ref 3.5–5.2)
Alkaline Phosphatase: 88 U/L (ref 39–117)
BUN: 14 mg/dL (ref 6–23)
CO2: 28 mEq/L (ref 19–32)
Calcium: 9.8 mg/dL (ref 8.4–10.5)
Chloride: 103 mEq/L (ref 96–112)
Creatinine, Ser: 0.93 mg/dL (ref 0.40–1.20)
GFR: 59.58 mL/min — ABNORMAL LOW (ref >60.00)
Glucose, Bld: 142 mg/dL — ABNORMAL HIGH (ref 70–99)
Potassium: 4.1 mEq/L (ref 3.5–5.1)
Sodium: 138 mEq/L (ref 135–145)
Total Bilirubin: 0.4 mg/dL (ref 0.2–1.2)
Total Protein: 6.8 g/dL (ref 6.0–8.3)

## 2019-03-07 LAB — VITAMIN D 25 HYDROXY (VIT D DEFICIENCY, FRACTURES): VITD: 52.57 ng/mL (ref 30.00–100.00)

## 2019-03-07 LAB — TSH: TSH: 1.79 u[IU]/mL (ref 0.35–4.50)

## 2019-03-07 MED ORDER — FORTEO 600 MCG/2.4ML ~~LOC~~ SOPN: 20.0000 ug | PEN_INJECTOR | Freq: Every day | SUBCUTANEOUS | 6 refills | Status: DC

## 2019-03-07 NOTE — Progress Notes (Signed)
Name: Brittney Pierce [facility]  MRN/ DOB: [id number], [birth year]    Age/ Sex: 68 y.o., female    PCP: Elby Beck, FNP   Reason for Endocrinology Evaluation: Osteoporosis     Date of Initial Endocrinology Evaluation: 03/07/2019     HPI: [name] is a 68 y.o. female with a past medical history of HTN, Ulcerative Colitis ( in remission for the past 8 yr), scoliosis and GERD . The patient presented for initial endocrinology clinic visit on 03/07/2019 for consultative assistance with her Osteoporosis.   Pt was diagnosed with osteoporosis: 2020   Menarche at age : 53 Menopausal at age : age 20 Fracture Hx: ankle at age 58 ( jumped off a chair) Hx of HRT: for a few months  FH of osteoporosis or hip fracture: no Prior Hx of anti-estrogenic therapy : no Prior Hx of anti-resorptive therapy : no   Pt on Calcium/Vitamin D 650-500 BID  No prior radiation   Up to date on screening   On Prilosec for GERD which is well controlled.   HISTORY:  Past Medical History:  Past Medical History:  Diagnosis Date  . Disorder of bone and cartilage, unspecified   . Dysuria   . Esophageal reflux   . Lumbago   . Osteoarthrosis, unspecified whether generalized or localized, unspecified site   . Ulcerative colitis, unspecified   . Unspecified essential hypertension    Past Surgical History:  Past Surgical History:  Procedure Laterality Date  . CESAREAN SECTION     x2  . CHOLECYSTECTOMY    . [place] SURGERY  2008      Social History:  reports that she quit smoking about 38 years ago. She has never used smokeless tobacco. She reports current alcohol use. She reports that she does not use drugs.  Family History: family history includes Diabetes in her father; Heart attack (age of onset: 57) in her father; Lung cancer in her mother.   HOME MEDICATIONS: Allergies as of 03/07/2019      Reactions   Cephalexin    Codeine    REACTION: stomach upset   Doxycycline    GI side effects   Erythromycin    REACTION: stomach upset      Medication List       Accurate as of March 07, 2019 10:34 AM. If you have any questions, ask your nurse or doctor.        ALPRAZolam 0.25 MG tablet Commonly known as: Xanax Take 1 tablet (0.25 mg total) by mouth 2 (two) times daily as needed for anxiety.   BIOTIN PO Take 1 capsule by mouth daily.   Calcium 1200-1000 MG-UNIT Chew Chew 1 tablet by mouth daily.   cetirizine 10 MG tablet Commonly known as: ZYRTEC Take 10 mg by mouth daily.   COLLAGEN EX Apply topically.   diclofenac sodium 1 % Gel Commonly known as: VOLTAREN Apply topically as needed.   fluticasone 50 MCG/ACT nasal spray Commonly known as: FLONASE 2 sprays each nostril as directed.   gabapentin 300 MG capsule Commonly known as: NEURONTIN TAKE 1 CAPSULE BY MOUTH THREE TIMES A DAY   hydrocortisone 25 MG suppository Commonly known as: ANUSOL-HC Place 1 suppository (25 mg total) rectally 2 (two) times daily as needed.   meloxicam 15 MG tablet Commonly known as: MOBIC TAKE 1 TABLET DAILY   mesalamine 1000 MG suppository Commonly known as: CANASA Place 1,000 mg rectally at bedtime as needed.   metoprolol tartrate 25  MG tablet Commonly known as: LOPRESSOR TAKE 1/2 TABLET TWICE A DAY(SUBSTITUTED FOR LOPRESSOR)   MULTIVITAMIN PO Take 1 tablet by mouth daily.   omeprazole 40 MG capsule Commonly known as: PRILOSEC Take 1 capsule (40 mg total) by mouth daily.   POTASSIUM GLUCONATE PO Take 1 capsule by mouth daily.   PROBIOTIC DAILY PO Take by mouth.   sulfamethoxazole-trimethoprim 800-160 MG tablet Commonly known as: BACTRIM DS Take 1 tablet by mouth 2 (two) times daily.         REVIEW OF SYSTEMS: A comprehensive ROS was conducted with the patient and is negative except as per HPI and below:  Review of Systems  Gastrointestinal: Negative for diarrhea and nausea.  Musculoskeletal: Positive for back pain.   Neurological: Positive for tingling. Negative for tremors.       OBJECTIVE:  VS: BP 118/72 (BP Location: Left Arm, Patient Position: Sitting, Cuff Size: Large)   Pulse 84   Temp 98.2 F (36.8 C)   Ht 5' 5"  (1.651 m)   Wt 194 lb 9.6 oz (88.3 kg)   SpO2 96%   BMI 32.38 kg/m    Wt Readings from Last 3 Encounters:  03/07/19 194 lb 9.6 oz (88.3 kg)  05/20/18 189 lb 12 oz (86.1 kg)  05/10/18 186 lb 12 oz (84.7 kg)     EXAM: General: Pt appears well and is in NAD  Hydration: Well-hydrated with moist mucous membranes and good skin turgor  Eyes: External eye exam normal without stare, lid lag or exophthalmos.  EOM intact.  PERRL.  Ears, Nose, Throat: Hearing: Grossly intact bilaterally Dental: Good dentition  Throat: Clear without mass, erythema or exudate  Neck: General: Supple without adenopathy. Thyroid: Thyroid size normal.  No goiter or nodules appreciated. No thyroid bruit.  Lungs: Clear with good BS bilat with no rales, rhonchi, or wheezes  Heart: Auscultation: RRR.  Abdomen: Normoactive bowel sounds, soft, nontender, without masses or organomegaly palpable  Extremities: Gait and station: Normal gait  Digits and nails: No clubbing, cyanosis, petechiae, or nodes Head and neck: Normal alignment and mobility BL UE: Normal ROM and strength. BL LE: No pretibial edema normal ROM and strength.  Skin: Hair: Texture and amount normal with gender appropriate distribution Skin Inspection: No rashes, acanthosis nigricans/skin tags. No lipohypertrophy Skin Palpation: Skin temperature, texture, and thickness normal to palpation  Neuro: Cranial nerves: II - XII grossly intact  Cerebellar: Normal coordination and movement; no tremor Motor: Normal strength throughout DTRs: 2+ and symmetric in UE without delay in relaxation phase  Mental Status: Judgment, insight: Intact Orientation: Oriented to time, place, and person Memory: Intact for recent and remote events Mood and affect: No  depression, anxiety, or agitation     DATA REVIEWED: Results for AADHIRA, HEFFERNAN (MRN [id number]) as of 03/08/2019 10:50  Ref. Range 03/07/2019 10:54  Sodium Latest Ref Range: 135 - 145 mEq/L 138  Potassium Latest Ref Range: 3.5 - 5.1 mEq/L 4.1  Chloride Latest Ref Range: 96 - 112 mEq/L 103  CO2 Latest Ref Range: 19 - 32 mEq/L 28  Glucose Latest Ref Range: 70 - 99 mg/dL 142 (H)  BUN Latest Ref Range: 6 - 23 mg/dL 14  Creatinine Latest Ref Range: 0.40 - 1.20 mg/dL 0.93  Calcium Latest Ref Range: 8.4 - 10.5 mg/dL 9.8  Alkaline Phosphatase Latest Ref Range: 39 - 117 U/L 88  Albumin Latest Ref Range: 3.5 - 5.2 g/dL 4.2  AST Latest Ref Range: 0 - 37 U/L 32  ALT  Latest Ref Range: 0 - 35 U/L 30  Total Protein Latest Ref Range: 6.0 - 8.3 g/dL 6.8  Total Bilirubin Latest Ref Range: 0.2 - 1.2 mg/dL 0.4  GFR Latest Ref Range: >60.00 mL/min 59.58 (L)  VITD Latest Ref Range: 30.00 - 100.00 ng/mL 52.57  TSH Latest Ref Range: 0.35 - 4.50 uIU/mL 1.79      ASSESSMENT/PLAN/RECOMMENDATIONS:   1. Osteoporosis :  - We discussed options such as Bisphosphonates, prolia and Forteo, due to a pending spine surgy for her scoliosis , her skeleton health needs to be optimized prior to this,  And neurosurgery prefers Forteo  - We discussed side effects such as local reaction and rat studies of osteosarcoma, we also discussed she is at low risk for this . - She will be trained on Forteo use through our educator Ms. Bernie Covey.    Medications : Forteo 20 mcg daily  Change Calcium Carbonate to citrate for better absorption profile in the setting of chronic PPI use.  [place] Vitamin D [phone] iu daily   F/U in 6 months    Signed electronically by: Mack Guise, MD  [facility]  [facility] [address] [place],  [address] Phone: [phone] FAX: [phone]   CC: Elby Beck, [place]  [address] Phone: [phone] Fax: [phone]   Return to Endocrinology clinic as below: Future Appointments  Date Time Provider [place]  05/16/2019 10:00 AM Eustace Pen, RN [facility]  05/22/2019 10:30 AM Elby Beck, FNP [facility]

## 2019-03-07 NOTE — Patient Instructions (Signed)
-   Please change Calcium Carbonate to calcium Citrate consume a total of 1200 mg daily  - Vitamin D 1000 iu daily

## 2019-03-10 ENCOUNTER — Telehealth: Payer: Self-pay

## 2019-03-10 NOTE — Telephone Encounter (Signed)
[employer] approval # [id number] for forteo from 07/25/2018 until 03/08/2021

## 2019-03-13 DIAGNOSIS — M47816 Spondylosis without myelopathy or radiculopathy, lumbar region: Secondary | ICD-10-CM | POA: Diagnosis not present

## 2019-03-15 ENCOUNTER — Encounter: Payer: Self-pay | Admitting: Family Medicine

## 2019-03-15 ENCOUNTER — Ambulatory Visit (INDEPENDENT_AMBULATORY_CARE_PROVIDER_SITE_OTHER): Payer: Medicare HMO | Admitting: Family Medicine

## 2019-03-15 ENCOUNTER — Other Ambulatory Visit: Payer: Self-pay

## 2019-03-15 VITALS — Ht 65.0 in | Wt 194.0 lb

## 2019-03-15 DIAGNOSIS — J329 Chronic sinusitis, unspecified: Secondary | ICD-10-CM

## 2019-03-15 DIAGNOSIS — B9689 Other specified bacterial agents as the cause of diseases classified elsewhere: Secondary | ICD-10-CM

## 2019-03-15 MED ORDER — AMOXICILLIN 500 MG PO CAPS: 1000.0000 mg | ORAL_CAPSULE | Freq: Two times a day (BID) | ORAL | 0 refills | Status: AC

## 2019-03-15 NOTE — Progress Notes (Signed)
Virtual Visit via Video Note  I connected with Brittney Pierce on 03/15/19 at  4:00 PM EDT by a video enabled telemedicine application and verified that I am speaking with the correct person using two identifiers.  Location: Patient: In her home Provider: [place]   I discussed the limitations of evaluation and management by telemedicine and the availability of in person appointments. The patient expressed understanding and agreed to proceed.  History of Present Illness: Chief Complaint  Patient presents with  . Sinusitis    Pt c/o swollen sinuses, PND, sore throat since Spring months. Pt states that now she is having coughing with chest congestion. Pt has been using Sudafed PRN. Pt feels that she is starting to get an upper resp infection. Denies fever, runny nose.     This is a 68 year old female who requests virtual visit today to discuss upper respiratory symptoms.  See chief complaint above.  She reports symptoms since mid spring including nasal drainage, sinus congestion.  This is progressed and now she is having some swelling under her eyes, postnasal drainage, morning cough, some mild heavy feeling in her chest.  She denies ear pain, fever.  She has some occasional wheezing that she hears in her upper throat.  She denies any possible COVID-19 contacts.  She reports having occasional sinus infections.  She has multiple antibiotic allergies and reports intolerance to azithromycin.  She has a cephalexin allergy listed but no reaction.  She cannot recall her reaction.  When I look back I see that she has been on amoxicillin in the past and I do not see any documentation of intolerance or allergy.  She does not recall any reaction to amoxicillin and thinks that she tolerated it without problems.  She is currently been taking Sudafed, Zyrtec and daily fluticasone nasal spray for her symptoms.  She reports some improvement with these medications.  Past Medical History:   Diagnosis Date  . Disorder of bone and cartilage, unspecified   . Dysuria   . Esophageal reflux   . Lumbago   . Osteoarthrosis, unspecified whether generalized or localized, unspecified site   . Ulcerative colitis, unspecified   . Unspecified essential hypertension    Past Surgical History:  Procedure Laterality Date  . CESAREAN SECTION     x2  . CHOLECYSTECTOMY    . [place] SURGERY  2008   Family History  Problem Relation Age of Onset  . Heart attack Father 10  . Diabetes Father   . Lung cancer Mother    Social History   Tobacco Use  . Smoking status: Former Smoker    Quit date: 07/27/1980    Years since quitting: 38.6  . Smokeless tobacco: Never Used  Substance Use Topics  . Alcohol use: Yes    Comment: Once a month (maybe)  . Drug use: No      Observations/Objective: The patient is alert and answers questions appropriately.  Visible skin is unremarkable.  She does appear to have some mild swelling under her eyes bilaterally.  She sounds mildly congested.  She is normally conversive without shortness of breath, audible wheeze or witnessed cough.  Her mood and affect are appropriate.  Ht 5' 5"  (1.651 m)   Wt 194 lb (88 kg)   BMI 32.28 kg/m  Wt Readings from Last 3 Encounters:  03/15/19 194 lb (88 kg)  03/07/19 194 lb 9.6 oz (88.3 kg)  05/20/18 189 lb 12 oz (86.1 kg)    Assessment and Plan:  1. Bacterial sinusitis -Given prolonged course of symptoms and lack of resolution with over-the-counter remedies, will give her a course of antibiotic -She was instructed to follow-up if no improvement with antibiotic or if worsening symptoms, she was instructed to continue over-the-counter medications as she has been doing and can add saline nasal spray several times a day - amoxicillin (AMOXIL) 500 MG capsule; Take 2 capsules (1,000 mg total) by mouth every 12 (twelve) hours for 5 days.  Dispense: 20 capsule; Refill: 0   Clarene Reamer, FNP-BC  [facility]  at [facility]  03/15/2019 4:55 PM   Follow Up Instructions: Visit recap sent to patient via my chart   I discussed the assessment and treatment plan with the patient. The patient was provided an opportunity to ask questions and all were answered. The patient agreed with the plan and demonstrated an understanding of the instructions.   The patient was advised to call back or seek an in-person evaluation if the symptoms worsen or if the condition fails to improve as anticipated.   Elby Beck, FNP

## 2019-03-17 ENCOUNTER — Other Ambulatory Visit: Payer: Self-pay

## 2019-03-17 ENCOUNTER — Telehealth: Payer: Self-pay

## 2019-03-17 ENCOUNTER — Other Ambulatory Visit: Payer: Self-pay | Admitting: Family Medicine

## 2019-03-17 ENCOUNTER — Emergency Department
Admission: EM | Admit: 2019-03-17 | Discharge: 2019-03-17 | Disposition: A | Discharge status: Home / Self Care | Payer: Medicare HMO | Attending: Emergency Medicine | Admitting: Emergency Medicine

## 2019-03-17 DIAGNOSIS — I10 Essential (primary) hypertension: Secondary | ICD-10-CM | POA: Insufficient documentation

## 2019-03-17 DIAGNOSIS — Z79899 Other long term (current) drug therapy: Secondary | ICD-10-CM | POA: Diagnosis not present

## 2019-03-17 DIAGNOSIS — T7849XA Other allergy, initial encounter: Secondary | ICD-10-CM | POA: Insufficient documentation

## 2019-03-17 DIAGNOSIS — Z87891 Personal history of nicotine dependence: Secondary | ICD-10-CM | POA: Insufficient documentation

## 2019-03-17 DIAGNOSIS — L509 Urticaria, unspecified: Secondary | ICD-10-CM | POA: Diagnosis present

## 2019-03-17 DIAGNOSIS — T7840XA Allergy, unspecified, initial encounter: Secondary | ICD-10-CM | POA: Diagnosis not present

## 2019-03-17 DIAGNOSIS — B9689 Other specified bacterial agents as the cause of diseases classified elsewhere: Secondary | ICD-10-CM

## 2019-03-17 DIAGNOSIS — J329 Chronic sinusitis, unspecified: Secondary | ICD-10-CM

## 2019-03-17 MED ORDER — DIPHENHYDRAMINE HCL 50 MG/ML IJ SOLN
12.5000 mg | Freq: Once | INTRAMUSCULAR | Status: AC
  Administered 2019-03-17: 14:00:00 12.5 mg via INTRAVENOUS
  Filled 2019-03-17: qty 1

## 2019-03-17 MED ORDER — PREDNISONE 10 MG (21) PO TBPK: ORAL_TABLET | ORAL | 0 refills | Status: DC

## 2019-03-17 MED ORDER — SODIUM CHLORIDE 0.9 % IV BOLUS
500.0000 mL | Freq: Once | INTRAVENOUS | Status: AC
  Administered 2019-03-17: 14:00:00 500 mL via INTRAVENOUS

## 2019-03-17 MED ORDER — METHYLPREDNISOLONE SODIUM SUCC 125 MG IJ SOLR
125.0000 mg | Freq: Once | INTRAMUSCULAR | Status: AC
  Administered 2019-03-17: 125 mg via INTRAVENOUS
  Filled 2019-03-17: qty 2

## 2019-03-17 MED ORDER — SULFAMETHOXAZOLE-TRIMETHOPRIM 800-160 MG PO TABS: 1.0000 | ORAL_TABLET | Freq: Two times a day (BID) | ORAL | 0 refills | Status: AC

## 2019-03-17 MED ORDER — AZITHROMYCIN 250 MG PO TABS: ORAL_TABLET | ORAL | 0 refills | Status: DC

## 2019-03-17 NOTE — Telephone Encounter (Addendum)
pt took sulfamethoxazole/trimethoprim this AM; after taking med pt developed rash and itching with some whelps on arms and upper torso.pt felt her throat was hurting and going into both ears and throat felt restricted; pt said throat feels swollen and feels like a lump in throat; pt does not have difficulty breathing. Pt feeling nauseated. Pt took zyrtec at 11:30. Pt does not want to go to ED due to covid. Glenda Chroman FNP said pt needs to go to ED with pts allergic reactions to abx. The ED is very cautious due to covid and is taking all possible precautions for spread of covid. Offered to call 911 for pt she said no she could drive and if starts with difficulty breathing will pull over and call 911. Pt going to [facility]. Glenda Chroman FNP said to tell pt she will FU with her later today to see how she is doing. Pt voiced understanding. Bactrim DS added to pts allergy list.FYI to Glenda Chroman FNP.

## 2019-03-17 NOTE — Telephone Encounter (Signed)
Noted. Patient seen, treated and released from ED.

## 2019-03-17 NOTE — Discharge Instructions (Signed)
Please follow-up with your primary care provider or return to the emergency department immediately if you begin to have any shortness of breath, feeling that your throat is swelling, or any other concerning symptoms.  Take the medication above as prescribed.

## 2019-03-17 NOTE — ED Triage Notes (Signed)
Pt comes via POV from home with c/o allergic reaction. Pt states she has sinus infection and was prescribed sulfamethoxazole. Pt states she took pill at 10:30 and within 10 minutes pt states she started to feel swelling and hurt in her throat and ears. Pt states she began to feel nausea.  Pt states she noticed some hives and took a zyrtec. Pt states the hives got better. Pt states she was then informed by PCP to come to emergency room.  Pt denies any SOB or CP.  Pt in NAD.

## 2019-03-17 NOTE — ED Provider Notes (Signed)
[facility] Emergency Department Provider Note ____________________________________________   First MD Initiated Contact with Patient 03/17/19 1338     (approximate)  I have reviewed the triage vital signs and the nursing notes.   HISTORY  Chief Complaint Allergic Reaction  HPI Brittney Pierce is a 68 y.o. female who presents to the emergency department and evaluation of hives, chest tightness, sensation of throat tightness radiating into neck and ears. Symptoms started after taking Bactrim. She took Zyrtec and states that it "helped." Her PCP told her to come to the ER.    Past Medical History:  Diagnosis Date  . Disorder of bone and cartilage, unspecified   . Dysuria   . Esophageal reflux   . Lumbago   . Osteoarthrosis, unspecified whether generalized or localized, unspecified site   . Ulcerative colitis, unspecified   . Unspecified essential hypertension     Patient Active Problem List   Diagnosis Date Noted  . Acute cystitis 06/10/2017  . Anxiety 05/11/2017  . Well woman exam 05/03/2017  . Grief 04/28/2016  . Insomnia 04/28/2016  . Fear of travel with panic attacks 12/11/2014  . Hyperglycemia 02/24/2011  . Essential hypertension 01/03/2010  . GERD 01/03/2010  . Ulcerative colitis ([place]) 01/03/2010  . Osteoarthritis 01/03/2010  . BACK PAIN, LUMBAR, CHRONIC 01/03/2010  . OSTEOPENIA 01/03/2010    Past Surgical History:  Procedure Laterality Date  . CESAREAN SECTION     x2  . CHOLECYSTECTOMY    . [place] SURGERY  2008    Prior to Admission medications   Medication Sig Start Date End Date Taking? Authorizing Provider  amoxicillin (AMOXIL) 500 MG capsule Take 2 capsules (1,000 mg total) by mouth every 12 (twelve) hours for 5 days. 03/15/19 03/20/19  Elby Beck, FNP  azithromycin (ZITHROMAX) 250 MG tablet 2 tablets today, then 1 tablet for the next 4 days. 03/17/19   Roshanda Balazs B, FNP  BIOTIN PO Take 1 capsule by  mouth daily.    [provider]  Calcium 1200-1000 MG-UNIT CHEW Chew 1 tablet by mouth daily.      [provider]  cetirizine (ZYRTEC) 10 MG tablet Take 10 mg by mouth daily.      [provider]  COLLAGEN PO Take 4 tablets by mouth daily.    [provider]  diclofenac sodium (VOLTAREN) 1 % GEL Apply topically as needed.     [provider]  fluticasone (FLONASE) 50 MCG/ACT nasal spray 2 sprays each nostril as directed. 11/18/12   Lucille Passy, MD  gabapentin (NEURONTIN) 300 MG capsule Take 300 mg by mouth 2 (two) times daily.  01/16/19   [provider]  metoprolol tartrate (LOPRESSOR) 25 MG tablet TAKE 1/2 TABLET TWICE A DAY(SUBSTITUTED FOR LOPRESSOR) 01/30/19   Jearld Fenton, NP  Multiple Vitamins-Minerals (MULTIVITAMIN PO) Take 1 tablet by mouth daily.    [provider]  omeprazole (PRILOSEC) 40 MG capsule Take 1 capsule (40 mg total) by mouth daily. 05/20/18   Elby Beck, FNP  POTASSIUM GLUCONATE PO Take 1 capsule by mouth daily.    [provider]  predniSONE (STERAPRED UNI-PAK 21 TAB) 10 MG (21) TBPK tablet Take 6 tablets on the first day and decrease by 1 tablet each day until finished. 03/17/19   Gesselle Fitzsimons, Johnette Abraham B, FNP  Probiotic Product (PROBIOTIC DAILY PO) Take by mouth.    [provider]  sulfamethoxazole-trimethoprim (BACTRIM DS) 800-160 MG tablet Take 1 tablet by mouth 2 (  two) times daily for 7 days. 03/17/19 03/24/19  Elby Beck, FNP  Teriparatide, Recombinant, (FORTEO) 600 MCG/2.4ML SOPN Inject 20 mcg into the skin daily. Patient not taking: Reported on 03/15/2019 03/07/19   Shamleffer, Melanie Crazier, MD    Allergies Cephalexin, Bactrim ds [sulfamethoxazole-trimethoprim], Codeine, Doxycycline, Erythromycin, and Sulfamethizole  Family History  Problem Relation Age of Onset  . Heart attack Father 8  . Diabetes Father   . Lung cancer Mother     Social History Social History    Tobacco Use  . Smoking status: Former Smoker    Quit date: 07/27/1980    Years since quitting: 38.6  . Smokeless tobacco: Never Used  Substance Use Topics  . Alcohol use: Yes    Comment: Once a month (maybe)  . Drug use: No    Review of Systems  Constitutional: No fever/chills. Eyes: No visual changes. ENT: Positive for throat "tightness." Cardiovascular: Denies chest pain. Positive for chest tightness. Respiratory: Denies shortness of breath. Denies wheezing. Gastrointestinal: No abdominal pain.  Positive for nausea, no vomiting.  No diarrhea.  No constipation. Genitourinary: Negative for dysuria. Musculoskeletal: Negative for back pain. Skin: Negative for rash. Neurological: Negative for headaches, focal weakness or numbness. ___________________________________________   PHYSICAL EXAM:  VITAL SIGNS: ED Triage Vitals [03/17/19 1235]  Enc Vitals Group     BP (!) 152/71     Pulse Rate 96     Resp 18     Temp 97.7 F (36.5 C)     Temp src      SpO2 97 %     Weight      Height      Head Circumference      Peak Flow      Pain Score 0     Pain Loc      Pain Edu?      Excl. in [place]?     Constitutional: Alert and oriented. Well appearing and in no acute distress. Eyes: Conjunctivae are normal. PERRL. EOMI. Head: Atraumatic. Nose: No congestion/rhinnorhea. Mouth/Throat: Mucous membranes are moist.  Oropharynx non-erythematous.  Mallampati score of 2. Neck: No stridor.   Hematological/Lymphatic/Immunilogical: No cervical lymphadenopathy. Cardiovascular: Normal rate, regular rhythm. Grossly normal heart sounds.  Good peripheral circulation. Respiratory: Normal respiratory effort.  No retractions. Lungs CTAB. Gastrointestinal: Soft and nontender. No distention. No abdominal bruits. No CVA tenderness. Musculoskeletal: No lower extremity tenderness nor edema.  No joint effusions. Neurologic:  Normal speech and language. No gross focal neurologic deficits are appreciated. No  gait instability. Skin:  Skin is warm, dry and intact. No rash noted. Psychiatric: Mood and affect are normal. Speech and behavior are normal.  ____________________________________________   LABS (all labs ordered are listed, but only abnormal results are displayed)  Labs Reviewed - No data to display ____________________________________________  EKG  Not indicated ____________________________________________  RADIOLOGY  ED MD interpretation:    Not indicated  Official radiology report(s): No results found.  ____________________________________________   PROCEDURES  Procedure(s) performed (including Critical Care):  Procedures  ____________________________________________   INITIAL IMPRESSION / ASSESSMENT AND PLAN     68 year old female presenting to the emergency department for treatment and evaluation after having an allergic reaction to Bactrim.  See HPI for further details.  Symptoms are concerning for a mild anaphylaxis.  Although she states that she is feeling better after Zyrtec further treatment is indicated.  She will receive Solu-Medrol, Benadryl, and fluids.  We will then monitor her to make sure there is no rebound reaction.  DIFFERENTIAL DIAGNOSIS  Drug allergy, anaphylaxis  ED COURSE  While in the department, symptoms completely resolved. She denies chest/throat tightness or nausea.  She will be discharged home with prednisone and Atarax.  She is to continue taking her Zyrtec as well.  She was advised to return immediately if she begins to feel short of breath or feels her chest or throat tightening.  She was encouraged to follow-up with her primary care provider or other symptoms of concern.  Patient requested to be transition to a separate antibiotic to treat her sinus infection which is the reason she was on Bactrim in the first place.  She has multiple drug allergies.  She was provided a prescription for azithromycin.  It does look like she had a  adverse effect of GI upset with erythromycin in the past but not a true allergy. ____________________________________________   FINAL CLINICAL IMPRESSION(S) / ED DIAGNOSES  Final diagnoses:  Allergic reaction to drug, initial encounter     ED Discharge Orders         Ordered    predniSONE (STERAPRED UNI-PAK 21 TAB) 10 MG (21) TBPK tablet     03/17/19 1518    azithromycin (ZITHROMAX) 250 MG tablet    Note to Pharmacy: Sinusitis   03/17/19 1522           Note:  This document was prepared using Dragon voice recognition software and may include unintentional dictation errors.   Victorino Dike, FNP 03/17/19 3582    Blake Divine, MD 03/17/19 [phone]

## 2019-03-21 ENCOUNTER — Telehealth: Payer: Self-pay | Admitting: Nutrition

## 2019-03-22 ENCOUNTER — Encounter: Payer: Self-pay | Admitting: Emergency Medicine

## 2019-03-22 ENCOUNTER — Encounter: Payer: Self-pay | Admitting: Family Medicine

## 2019-03-22 ENCOUNTER — Encounter: Payer: Medicare HMO | Admitting: Nutrition

## 2019-03-22 ENCOUNTER — Telehealth: Payer: Self-pay | Admitting: Internal Medicine

## 2019-03-22 ENCOUNTER — Emergency Department
Admission: EM | Admit: 2019-03-22 | Discharge: 2019-03-22 | Disposition: A | Discharge status: Home / Self Care | Payer: Medicare HMO | Attending: Student in an Organized Health Care Education/Training Program | Admitting: Student in an Organized Health Care Education/Training Program

## 2019-03-22 ENCOUNTER — Emergency Department: Payer: Medicare HMO

## 2019-03-22 ENCOUNTER — Other Ambulatory Visit: Payer: Self-pay

## 2019-03-22 DIAGNOSIS — I10 Essential (primary) hypertension: Secondary | ICD-10-CM | POA: Insufficient documentation

## 2019-03-22 DIAGNOSIS — Z20828 Contact with and (suspected) exposure to other viral communicable diseases: Secondary | ICD-10-CM | POA: Diagnosis not present

## 2019-03-22 DIAGNOSIS — Z79899 Other long term (current) drug therapy: Secondary | ICD-10-CM | POA: Insufficient documentation

## 2019-03-22 DIAGNOSIS — R531 Weakness: Secondary | ICD-10-CM | POA: Insufficient documentation

## 2019-03-22 DIAGNOSIS — R0902 Hypoxemia: Secondary | ICD-10-CM | POA: Diagnosis not present

## 2019-03-22 LAB — URINALYSIS, COMPLETE (UACMP) WITH MICROSCOPIC
Bacteria, UA: NONE SEEN
Bilirubin Urine: NEGATIVE
Glucose, UA: NEGATIVE mg/dL
Hgb urine dipstick: NEGATIVE
Ketones, ur: NEGATIVE mg/dL
Nitrite: NEGATIVE
Protein, ur: NEGATIVE mg/dL
Specific Gravity, Urine: 1.004 — ABNORMAL LOW (ref 1.005–1.030)
pH: 7 (ref 5.0–8.0)

## 2019-03-22 LAB — CBC
HCT: 43.5 % (ref 36.0–46.0)
Hemoglobin: 14.4 g/dL (ref 12.0–15.0)
MCH: 28.8 pg (ref 26.0–34.0)
MCHC: 33.1 g/dL (ref 30.0–36.0)
MCV: 87 fL (ref 80.0–100.0)
Platelets: 330 10*3/uL (ref 150–400)
RBC: 5 MIL/uL (ref 3.87–5.11)
RDW: 12.6 % (ref 11.5–15.5)
WBC: 17.9 10*3/uL — ABNORMAL HIGH (ref 4.0–10.5)
nRBC: 0 % (ref 0.0–0.2)

## 2019-03-22 LAB — BASIC METABOLIC PANEL
Anion gap: 10 (ref 5–15)
BUN: 19 mg/dL (ref 8–23)
CO2: 28 mmol/L (ref 22–32)
Calcium: 9.5 mg/dL (ref 8.9–10.3)
Chloride: 102 mmol/L (ref 98–111)
Creatinine, Ser: 0.88 mg/dL (ref 0.44–1.00)
GFR calc Af Amer: >60 mL/min (ref >60)
GFR calc non Af Amer: >60 mL/min (ref >60)
Glucose, Bld: 126 mg/dL — ABNORMAL HIGH (ref 70–99)
Potassium: 3.5 mmol/L (ref 3.5–5.1)
Sodium: 140 mmol/L (ref 135–145)

## 2019-03-22 LAB — TROPONIN I (HIGH SENSITIVITY)
Troponin I (High Sensitivity): 6 ng/L (ref <18)
Troponin I (High Sensitivity): 6 ng/L (ref <18)

## 2019-03-22 LAB — GLUCOSE, CAPILLARY: Glucose-Capillary: 127 mg/dL — ABNORMAL HIGH (ref 70–99)

## 2019-03-22 MED ORDER — SODIUM CHLORIDE 0.9 % IV BOLUS
500.0000 mL | Freq: Once | INTRAVENOUS | Status: AC
  Administered 2019-03-22: 12:00:00 500 mL via INTRAVENOUS

## 2019-03-22 NOTE — Telephone Encounter (Signed)
Pt got in touch with after hours service cancelled appt for today at 1130.

## 2019-03-22 NOTE — ED Triage Notes (Signed)
Patient presents to the ED via EMS from home.  Patient originally called EMS for hypertension, bp was 140/90.  Patient was seen in the ED 5 days ago and was treated for sinusitis and an allergic reaction to sulfa antibiotics that her pcp prescribed for her sinusitis.  Patient was switched to arithromycin and prednisone but reports now feeling weak and tired.  Patient denies nausea, vomiting and diarrhea.

## 2019-03-22 NOTE — ED Provider Notes (Addendum)
[facility] Emergency Department Provider Note    First MD Initiated Contact with Patient 03/22/19 1154     (approximate)  I have reviewed the triage vital signs and the nursing notes.   HISTORY  Chief Complaint Weakness    HPI Brittney Pierce is a 68 y.o. female below listed past medical history presents the ER due to concern for elevated blood pressure as well as generalized weakness for past several days.  Patient was recently started on new antibiotic and prednisone for sinusitis.  She feels that her symptoms from sinusitis have improved.  States that she is been feeling anxious at home.  Is not had any diaphoresis but did have intermittent episodes of chest pressure.  Denies any chest pain.  No nausea or vomiting.  Not feeling any palpitations.    Past Medical History:  Diagnosis Date   Disorder of bone and cartilage, unspecified    Dysuria    Esophageal reflux    Lumbago    Osteoarthrosis, unspecified whether generalized or localized, unspecified site    Ulcerative colitis, unspecified    Unspecified essential hypertension    Family History  Problem Relation Age of Onset   Heart attack Father 61   Diabetes Father    Lung cancer Mother    Past Surgical History:  Procedure Laterality Date   CESAREAN SECTION     x2   CHOLECYSTECTOMY     LUMBAR [place] SURGERY  2008   Patient Active Problem List   Diagnosis Date Noted   Acute cystitis 06/10/2017   Anxiety 05/11/2017   Well woman exam 05/03/2017   Grief 04/28/2016   Insomnia 04/28/2016   Fear of travel with panic attacks 12/11/2014   Hyperglycemia 02/24/2011   Essential hypertension 01/03/2010   GERD 01/03/2010   Ulcerative colitis ([place]) 01/03/2010   Osteoarthritis 01/03/2010   BACK PAIN, LUMBAR, CHRONIC 01/03/2010   OSTEOPENIA 01/03/2010      Prior to Admission medications   Medication Sig Start Date End Date Taking? Authorizing Provider    azithromycin (ZITHROMAX) 250 MG tablet 2 tablets today, then 1 tablet for the next 4 days. 03/17/19   Triplett, Cari B, FNP  BIOTIN PO Take 1 capsule by mouth daily.    [provider]  Calcium 1200-1000 MG-UNIT CHEW Chew 1 tablet by mouth daily.      [provider]  cetirizine (ZYRTEC) 10 MG tablet Take 10 mg by mouth daily.      [provider]  COLLAGEN PO Take 4 tablets by mouth daily.    [provider]  diclofenac sodium (VOLTAREN) 1 % GEL Apply topically as needed.     [provider]  fluticasone (FLONASE) 50 MCG/ACT nasal spray 2 sprays each nostril as directed. 11/18/12   Lucille Passy, MD  gabapentin (NEURONTIN) 300 MG capsule Take 300 mg by mouth 2 (two) times daily.  01/16/19   [provider]  metoprolol tartrate (LOPRESSOR) 25 MG tablet TAKE 1/2 TABLET TWICE A DAY(SUBSTITUTED FOR LOPRESSOR) 01/30/19   Jearld Fenton, NP  Multiple Vitamins-Minerals (MULTIVITAMIN PO) Take 1 tablet by mouth daily.    [provider]  omeprazole (PRILOSEC) 40 MG capsule Take 1 capsule (40 mg total) by mouth daily. 05/20/18   Elby Beck, FNP  POTASSIUM GLUCONATE PO Take 1 capsule by mouth daily.    [provider]  predniSONE (STERAPRED UNI-PAK 21 TAB) 10 MG (21) TBPK tablet Take 6 tablets on the first day and  decrease by 1 tablet each day until finished. 03/17/19   Triplett, Johnette Abraham B, FNP  Probiotic Product (PROBIOTIC DAILY PO) Take by mouth.    [provider]  sulfamethoxazole-trimethoprim (BACTRIM DS) 800-160 MG tablet Take 1 tablet by mouth 2 (two) times daily for 7 days. 03/17/19 03/24/19  Elby Beck, FNP  Teriparatide, Recombinant, (FORTEO) 600 MCG/2.4ML SOPN Inject 20 mcg into the skin daily. Patient not taking: Reported on 03/15/2019 03/07/19   Shamleffer, Melanie Crazier, MD    Allergies Cephalexin, Bactrim ds [sulfamethoxazole-trimethoprim], Codeine, Doxycycline, Erythromycin, and  Sulfamethizole    Social History Social History   Tobacco Use   Smoking status: Former Smoker    Quit date: 07/27/1980    Years since quitting: 38.6   Smokeless tobacco: Never Used  Substance Use Topics   Alcohol use: Yes    Comment: Once a month (maybe)   Drug use: No    Review of Systems Patient denies headaches, rhinorrhea, blurry vision, numbness, shortness of breath, chest pain, edema, cough, abdominal pain, nausea, vomiting, diarrhea, dysuria, fevers, rashes or hallucinations unless otherwise stated above in HPI. ____________________________________________   PHYSICAL EXAM:  VITAL SIGNS: Vitals:   03/22/19 1130 03/22/19 1230  BP: 126/84 121/73  Pulse: 70 68  Resp: 14 15  Temp:    SpO2: 96% 97%    Constitutional: Alert and oriented.  Eyes: Conjunctivae are normal.  Head: Atraumatic. Nose: No congestion/rhinnorhea. Mouth/Throat: Mucous membranes are moist.   Neck: No stridor. Painless ROM.  Cardiovascular: Normal rate, regular rhythm. Grossly normal heart sounds.  Good peripheral circulation. Respiratory: Normal respiratory effort.  No retractions. Lungs CTAB. Gastrointestinal: Soft and nontender. No distention. No abdominal bruits. No CVA tenderness. Genitourinary:  Musculoskeletal: No lower extremity tenderness nor edema.  No joint effusions. Neurologic:  Normal speech and language. No gross focal neurologic deficits are appreciated. No facial droop Skin:  Skin is warm, dry and intact. No rash noted. Psychiatric: Mood and affect are normal. Speech and behavior are normal.  ____________________________________________   LABS (all labs ordered are listed, but only abnormal results are displayed)  Results for orders placed or performed during the hospital encounter of 03/22/19 (from the past 24 hour(s))  Basic metabolic panel     Status: Abnormal   Collection Time: 03/22/19  9:20 AM  Result Value Ref Range   Sodium 140 135 - 145 mmol/L   Potassium 3.5  3.5 - 5.1 mmol/L   Chloride 102 98 - 111 mmol/L   CO2 28 22 - 32 mmol/L   Glucose, Bld 126 (H) 70 - 99 mg/dL   BUN 19 8 - 23 mg/dL   Creatinine, Ser 0.88 0.44 - 1.00 mg/dL   Calcium 9.5 8.9 - 10.3 mg/dL   GFR calc non Af Amer >60 >60 mL/min   GFR calc Af Amer >60 >60 mL/min   Anion gap 10 5 - 15  CBC     Status: Abnormal   Collection Time: 03/22/19  9:20 AM  Result Value Ref Range   WBC 17.9 (H) 4.0 - 10.5 K/uL   RBC 5.00 3.87 - 5.11 MIL/uL   Hemoglobin 14.4 12.0 - 15.0 g/dL   HCT 43.5 36.0 - 46.0 %   MCV 87.0 80.0 - 100.0 fL   MCH 28.8 26.0 - 34.0 pg   MCHC 33.1 30.0 - 36.0 g/dL   RDW 12.6 11.5 - 15.5 %   Platelets 330 150 - 400 K/uL   nRBC 0.0 0.0 - 0.2 %  Troponin I (High  Sensitivity)     Status: None   Collection Time: 03/22/19  9:20 AM  Result Value Ref Range   Troponin I (High Sensitivity) 6 <18 ng/L  Glucose, capillary     Status: Abnormal   Collection Time: 03/22/19  9:30 AM  Result Value Ref Range   Glucose-Capillary 127 (H) 70 - 99 mg/dL  Urinalysis, Complete w Microscopic     Status: Abnormal   Collection Time: 03/22/19 12:14 PM  Result Value Ref Range   Color, Urine STRAW (A) YELLOW   APPearance CLEAR (A) CLEAR   Specific Gravity, Urine 1.004 (L) 1.005 - 1.030   pH 7.0 5.0 - 8.0   Glucose, UA NEGATIVE NEGATIVE mg/dL   Hgb urine dipstick NEGATIVE NEGATIVE   Bilirubin Urine NEGATIVE NEGATIVE   Ketones, ur NEGATIVE NEGATIVE mg/dL   Protein, ur NEGATIVE NEGATIVE mg/dL   Nitrite NEGATIVE NEGATIVE   Leukocytes,Ua TRACE (A) NEGATIVE   RBC / HPF 0-5 0 - 5 RBC/hpf   WBC, UA 6-10 0 - 5 WBC/hpf   Bacteria, UA NONE SEEN NONE SEEN   Squamous Epithelial / LPF 0-5 0 - 5   Mucus PRESENT   Troponin I (High Sensitivity)     Status: None   Collection Time: 03/22/19 12:14 PM  Result Value Ref Range   Troponin I (High Sensitivity) 6 <18 ng/L   ____________________________________________  EKG  ED ECG REPORT I, Merlyn Lot, the attending physician, personally  viewed and interpreted this ECG.   Date: 03/22/2019  EKG Time: 9:26  Rate: 80  Rhythm: sinus  Axis: normal  Intervals: normal intervals  ST&T Change: non specific st abn that is consistent with previous ekg 07/11/2009  My review and personal interpretation at Time: 12:30   Indication: chest pressure  Rate: 80  Rhythm: sinus Axis: normal Other: motion artifact in limb leads but p wave identified in precordial leads, no stemi  No delta from previous ____________________________________________  RADIOLOGY  I personally reviewed all radiographic images ordered to evaluate for the above acute complaints and reviewed radiology reports and findings.  These findings were personally discussed with the patient.  Please see medical record for radiology report.  ____________________________________________   PROCEDURES  Procedure(s) performed:  Procedures    Critical Care performed: no ____________________________________________   INITIAL IMPRESSION / ASSESSMENT AND PLAN / ED COURSE  Pertinent labs & imaging results that were available during my care of the patient were reviewed by me and considered in my medical decision making (see chart for details).   DDX: dehdyrtation, electrolyte abn, sepsis, pna, sinusitis, htnive urgency, cva  Kaleen Rochette is a 68 y.o. who presents to the ED with symptoms as described above.  Patient well-appearing in no acute distress.  Afebrile hemodynamically stable.  No focal neuro deficits.  Not consistent with ACS.  EKG is nonischemic and she has 2- enzymes after several days of symptoms.  Doubt hypertensive urgency.  Her abdominal exam is soft and benign.  May be having some intolerance to antibiotics or the steroids.  States that she is been taking them at night and has not been sleeping well.  Advised her on altering her medications and abbreviate in her taper to get off that medication as it does not appear clear that she needed steroids at  this time.  Discussed signs and symptoms for which she should return to er.  Have discussed with the patient and available family all diagnostics and treatments performed thus far and all questions were answered to  the best of my ability. The patient demonstrates understanding and agreement with plan.      The patient was evaluated in Emergency Department today for the symptoms described in the history of present illness. He/she was evaluated in the context of the global COVID-19 pandemic, which necessitated consideration that the patient might be at risk for infection with the SARS-CoV-2 virus that causes COVID-19. Institutional protocols and algorithms that pertain to the evaluation of patients at risk for COVID-19 are in a state of rapid change based on information released by regulatory bodies including the CDC and federal and state organizations. These policies and algorithms were followed during the patient's care in the ED.  As part of my medical decision making, I reviewed the following data within the [place] notes reviewed and incorporated, Labs reviewed, notes from prior ED visits and [place] Controlled Substance Database   ____________________________________________   FINAL CLINICAL IMPRESSION(S) / ED DIAGNOSES  Final diagnoses:  Weakness      NEW MEDICATIONS STARTED DURING THIS VISIT:  New Prescriptions   No medications on file     Note:  This document was prepared using Dragon voice recognition software and may include unintentional dictation errors.    Merlyn Lot, MD 03/22/19 1423    Merlyn Lot, MD 03/22/19 1426

## 2019-03-22 NOTE — ED Notes (Signed)
Sent green and purple tubes to lab.

## 2019-03-22 NOTE — ED Notes (Addendum)
Pt able to ambulate independently in room with steady gait, reports increase in lightheadedness when BP is elevated. Reports only sleeping around 7-8 hours since last Friday. Denies any n/v/d, or SOB

## 2019-03-22 NOTE — Telephone Encounter (Signed)
Appointment scheduled for 03/21/19 with me for review of Forteo injections

## 2019-03-23 LAB — NOVEL CORONAVIRUS, NAA (HOSP ORDER, SEND-OUT TO REF LAB; TAT 18-24 HRS): SARS-CoV-2, NAA: NOT DETECTED

## 2019-03-28 DIAGNOSIS — M47816 Spondylosis without myelopathy or radiculopathy, lumbar region: Secondary | ICD-10-CM | POA: Diagnosis not present

## 2019-04-05 ENCOUNTER — Encounter: Payer: Medicare HMO | Attending: Internal Medicine | Admitting: Nutrition

## 2019-04-05 ENCOUNTER — Other Ambulatory Visit: Payer: Self-pay

## 2019-04-09 NOTE — Progress Notes (Signed)
Patient is here for her Forteo injection.  We discussed how to store this medication, when/how to take this and any side effects.  She was given a starter kit the all of the above directions and the number to a help line--Forteo Connect-- if any questions develop.  After following the steps, she correctly took her first injection into the right abdomen with no assistance from me.  She reported to having felt anything and after 20 min. Reported no symptoms of dizziness or rapid heart rate.   She had no final questions.

## 2019-04-09 NOTE — Patient Instructions (Signed)
Keep medication refrigerted Take an injection once daily, as directed. Call [facility] if questions.

## 2019-04-23 ENCOUNTER — Other Ambulatory Visit: Payer: Self-pay | Admitting: Internal Medicine

## 2019-04-25 MED ORDER — METOPROLOL TARTRATE 25 MG PO TABS: 12.5000 mg | ORAL_TABLET | Freq: Two times a day (BID) | ORAL | 0 refills | Status: DC

## 2019-05-07 ENCOUNTER — Other Ambulatory Visit: Payer: Self-pay | Admitting: Internal Medicine

## 2019-05-07 DIAGNOSIS — M545 Low back pain, unspecified: Secondary | ICD-10-CM

## 2019-05-07 DIAGNOSIS — G8929 Other chronic pain: Secondary | ICD-10-CM

## 2019-05-08 NOTE — Telephone Encounter (Signed)
Refill request from mail order pharmacy. Please call patient and see if she is taking meloxicam daily?

## 2019-05-08 NOTE — Telephone Encounter (Signed)
Last filled 01/30/2019... please advise

## 2019-05-09 DIAGNOSIS — M47816 Spondylosis without myelopathy or radiculopathy, lumbar region: Secondary | ICD-10-CM | POA: Diagnosis not present

## 2019-05-10 NOTE — Telephone Encounter (Signed)
Left a detailed message requesting a call back to verify medication use

## 2019-05-10 NOTE — Telephone Encounter (Signed)
Patietn called back and stated that she is taking the meloxicam daily, She stated that when she was in the emergency room they took it off her medication list but should not have

## 2019-05-11 DIAGNOSIS — H25013 Cortical age-related cataract, bilateral: Secondary | ICD-10-CM | POA: Diagnosis not present

## 2019-05-11 DIAGNOSIS — H524 Presbyopia: Secondary | ICD-10-CM | POA: Diagnosis not present

## 2019-05-11 DIAGNOSIS — H2513 Age-related nuclear cataract, bilateral: Secondary | ICD-10-CM | POA: Diagnosis not present

## 2019-05-11 DIAGNOSIS — H52223 Regular astigmatism, bilateral: Secondary | ICD-10-CM | POA: Diagnosis not present

## 2019-05-15 ENCOUNTER — Other Ambulatory Visit: Payer: Self-pay | Admitting: Family Medicine

## 2019-05-15 DIAGNOSIS — R7303 Prediabetes: Secondary | ICD-10-CM

## 2019-05-15 DIAGNOSIS — I10 Essential (primary) hypertension: Secondary | ICD-10-CM

## 2019-05-16 ENCOUNTER — Other Ambulatory Visit (INDEPENDENT_AMBULATORY_CARE_PROVIDER_SITE_OTHER): Payer: Medicare HMO

## 2019-05-16 ENCOUNTER — Ambulatory Visit: Payer: Medicare HMO

## 2019-05-16 DIAGNOSIS — R7303 Prediabetes: Secondary | ICD-10-CM | POA: Diagnosis not present

## 2019-05-16 LAB — HEMOGLOBIN A1C: Hgb A1c MFr Bld: 7.2 % — ABNORMAL HIGH (ref 4.6–6.5)

## 2019-05-16 LAB — LIPID PANEL
Cholesterol: 164 mg/dL (ref 0–200)
HDL: 56.6 mg/dL (ref >39.00)
LDL Cholesterol: 92 mg/dL (ref 0–99)
NonHDL: 107.76
Total CHOL/HDL Ratio: 3
Triglycerides: 79 mg/dL (ref 0.0–149.0)
VLDL: 15.8 mg/dL (ref 0.0–40.0)

## 2019-05-17 ENCOUNTER — Ambulatory Visit: Payer: Medicare HMO

## 2019-05-17 ENCOUNTER — Telehealth: Payer: Self-pay

## 2019-05-17 NOTE — Telephone Encounter (Signed)
Called patient to complete Medicare Wellness visit. I don't think the number listed in the chart is a working number. Called 5 times and it does not ring, it says the number cannot be reached at this time. Appointment was cancelled.

## 2019-05-22 ENCOUNTER — Other Ambulatory Visit: Payer: Self-pay

## 2019-05-22 ENCOUNTER — Encounter: Payer: Self-pay | Admitting: Family Medicine

## 2019-05-22 ENCOUNTER — Ambulatory Visit (INDEPENDENT_AMBULATORY_CARE_PROVIDER_SITE_OTHER): Payer: Medicare HMO | Admitting: Family Medicine

## 2019-05-22 VITALS — BP 110/80 | HR 87 | Temp 97.3°F | Ht 65.0 in | Wt 191.8 lb

## 2019-05-22 DIAGNOSIS — R739 Hyperglycemia, unspecified: Secondary | ICD-10-CM | POA: Diagnosis not present

## 2019-05-22 DIAGNOSIS — K51219 Ulcerative (chronic) proctitis with unspecified complications: Secondary | ICD-10-CM | POA: Diagnosis not present

## 2019-05-22 DIAGNOSIS — Z0000 Encounter for general adult medical examination without abnormal findings: Secondary | ICD-10-CM | POA: Diagnosis not present

## 2019-05-22 MED ORDER — MESALAMINE 1000 MG RE SUPP: 1000.0000 mg | Freq: Every evening | RECTAL | 0 refills | Status: DC | PRN

## 2019-05-22 NOTE — Patient Instructions (Signed)
Good to see you today  Schedule your flu shot  Follow up in 6 months  Health Maintenance After Age 70 After age 79, you are at a higher risk for certain long-term diseases and infections as well as injuries from falls. Falls are a major cause of broken bones and head injuries in people who are older than age 41. Getting regular preventive care can help to keep you healthy and well. Preventive care includes getting regular testing and making lifestyle changes as recommended by your health care provider. Talk with your health care provider about:  Which screenings and tests you should have. A screening is a test that checks for a disease when you have no symptoms.  A diet and exercise plan that is right for you. What should I know about screenings and tests to prevent falls? Screening and testing are the best ways to find a health problem early. Early diagnosis and treatment give you the best chance of managing medical conditions that are common after age 47. Certain conditions and lifestyle choices may make you more likely to have a fall. Your health care provider may recommend:  Regular vision checks. Poor vision and conditions such as cataracts can make you more likely to have a fall. If you wear glasses, make sure to get your prescription updated if your vision changes.  Medicine review. Work with your health care provider to regularly review all of the medicines you are taking, including over-the-counter medicines. Ask your health care provider about any side effects that may make you more likely to have a fall. Tell your health care provider if any medicines that you take make you feel dizzy or sleepy.  Osteoporosis screening. Osteoporosis is a condition that causes the bones to get weaker. This can make the bones weak and cause them to break more easily.  Blood pressure screening. Blood pressure changes and medicines to control blood pressure can make you feel dizzy.  Strength and balance  checks. Your health care provider may recommend certain tests to check your strength and balance while standing, walking, or changing positions.  Foot health exam. Foot pain and numbness, as well as not wearing proper footwear, can make you more likely to have a fall.  Depression screening. You may be more likely to have a fall if you have a fear of falling, feel emotionally low, or feel unable to do activities that you used to do.  Alcohol use screening. Using too much alcohol can affect your balance and may make you more likely to have a fall. What actions can I take to lower my risk of falls? General instructions  Talk with your health care provider about your risks for falling. Tell your health care provider if: ? You fall. Be sure to tell your health care provider about all falls, even ones that seem minor. ? You feel dizzy, sleepy, or off-balance.  Take over-the-counter and prescription medicines only as told by your health care provider. These include any supplements.  Eat a healthy diet and maintain a healthy weight. A healthy diet includes low-fat dairy products, low-fat (lean) meats, and fiber from whole grains, beans, and lots of fruits and vegetables. Home safety  Remove any tripping hazards, such as rugs, cords, and clutter.  Install safety equipment such as grab bars in bathrooms and safety rails on stairs.  Keep rooms and walkways well-lit. Activity   Follow a regular exercise program to stay fit. This will help you maintain your balance. Ask your health  care provider what types of exercise are appropriate for you.  If you need a cane or walker, use it as recommended by your health care provider.  Wear supportive shoes that have nonskid soles. Lifestyle  Do not drink alcohol if your health care provider tells you not to drink.  If you drink alcohol, limit how much you have: ? 0-1 drink a day for women. ? 0-2 drinks a day for men.  Be aware of how much alcohol is  in your drink. In the [place] one drink equals one typical bottle of beer (12 oz), one-half glass of wine (5 oz), or one shot of hard liquor (1 oz).  Do not use any products that contain nicotine or tobacco, such as cigarettes and e-cigarettes. If you need help quitting, ask your health care provider. Summary  Having a healthy lifestyle and getting preventive care can help to protect your health and wellness after age 24.  Screening and testing are the best way to find a health problem early and help you avoid having a fall. Early diagnosis and treatment give you the best chance for managing medical conditions that are more common for people who are older than age 66.  Falls are a major cause of broken bones and head injuries in people who are older than age 10. Take precautions to prevent a fall at home.  Work with your health care provider to learn what changes you can make to improve your health and wellness and to prevent falls. This information is not intended to replace advice given to you by your health care provider. Make sure you discuss any questions you have with your health care provider. Document Released: 05/26/2017 Document Revised: 11/03/2018 Document Reviewed: 05/26/2017 Elsevier Patient Education  2020 [employer].

## 2019-05-22 NOTE — Progress Notes (Signed)
Subjective:    Patient ID: Brittney Pierce, female    DOB: 11-Jul-1951, 68 y.o.   MRN: [id number]  HPI This is a 68 yo female who presents today for CPE. Has had a rough year. She and her daughter will be buying a house together soon. She is looking forward to this.    Last CPE- 9/19 Mammo/ dexa 12/28/2018 Pap- 04/18/2013 Colonoscopy- 08/07/2008 Tdap- declines due to insurance Flu- will have upcoming, should not have today due to recent steroid injection into back Eye- 02/2019 Dental- full dentures Exercise- has been unable to walk like she usually does due to back pain.   Back pain- has been taking Forteo for about 2 months, prescribed by endocrine [facility]). Had nerve ablation last week with some improvement. Also taking gabapentin. May be a candidate for water therapy. Pain improved. Was also put on calcium citrate instead of carbonate and had constipation and went back to chewable citrate.    History of ulcerative proctotitis- rare flares, but had recent flare with constipation secondary to calcium. Used the last of her mesalamine. Requests refill. Usually uses less than 2x/ year. No recent follow up with GI, not sure of previous provider, ? [facility]. No records in EMR.  Elevated blood sugar- hgba1c slightly elevated to 7.1. Patient reports that her diet has not been good lately due to inability to stand for prolonged periods of time to prepare meals. She expects this to significantly improve as she is already able to get around better following nerve block.   Past Medical History:  Diagnosis Date  . Disorder of bone and cartilage, unspecified   . Dysuria   . Esophageal reflux   . Lumbago   . Osteoarthrosis, unspecified whether generalized or localized, unspecified site   . Ulcerative colitis, unspecified   . Unspecified essential hypertension    Past Surgical History:  Procedure Laterality Date  . CESAREAN SECTION     x2  . CHOLECYSTECTOMY    . [place]  SURGERY  2008   Family History  Problem Relation Age of Onset  . Heart attack Father 20  . Diabetes Father   . Lung cancer Mother    Social History   Tobacco Use  . Smoking status: Former Smoker    Quit date: 07/27/1980    Years since quitting: 38.8  . Smokeless tobacco: Never Used  Substance Use Topics  . Alcohol use: Yes    Comment: Once a month (maybe)  . Drug use: No      Review of Systems  Constitutional: Positive for activity change (decreased due to back pain).  HENT: Negative.   Eyes: Negative.        Recent eye exam, needs cataract surgery.   Respiratory: Negative.   Cardiovascular: Negative.  Leg swelling: increased painless varicose veins with increased sitting.  Gastrointestinal: Positive for blood in stool (resolved with mesathalamine. ) and constipation (resolved after changing calcium preparation).  Endocrine: Negative.   Genitourinary: Negative.   Musculoskeletal: Positive for back pain.  Skin: Negative.   Allergic/Immunologic: Negative.   Neurological: Negative.        Objective:   Physical Exam Vitals signs reviewed.  Constitutional:      General: She is not in acute distress.    Appearance: Normal appearance. She is obese. She is not ill-appearing, toxic-appearing or diaphoretic.  HENT:     Head: Normocephalic and atraumatic.     Right Ear: Tympanic membrane, ear canal and external ear normal.  Left Ear: Tympanic membrane, ear canal and external ear normal.     Nose: Nose normal.     Mouth/Throat:     Mouth: Mucous membranes are moist.     Pharynx: Oropharynx is clear.  Eyes:     Conjunctiva/sclera: Conjunctivae normal.  Neck:     Musculoskeletal: Normal range of motion and neck supple.  Cardiovascular:     Rate and Rhythm: Normal rate and regular rhythm.     Heart sounds: Normal heart sounds.  Pulmonary:     Effort: Pulmonary effort is normal.     Breath sounds: Normal breath sounds.  Chest:     Breasts: Breasts are symmetrical.         Right: Normal. No swelling, bleeding, inverted nipple, mass or nipple discharge.        Left: Normal. No swelling, bleeding, inverted nipple, mass or nipple discharge.  Abdominal:     General: Bowel sounds are normal. There is no distension.     Palpations: Abdomen is soft. There is no mass.     Tenderness: There is no abdominal tenderness. There is no guarding or rebound.     Hernia: No hernia is present.  Musculoskeletal:     Right lower leg: No edema.     Left lower leg: No edema.  Skin:    General: Skin is warm and dry.  Neurological:     Mental Status: She is alert and oriented to person, place, and time.  Psychiatric:        Mood and Affect: Mood normal.        Behavior: Behavior normal.        Thought Content: Thought content normal.        Judgment: Judgment normal.       BP 110/80 (BP Location: Left Arm, Patient Position: Sitting, Cuff Size: Normal)   Pulse 87   Temp (!) 97.3 F (36.3 C) (Temporal)   Ht 5' 5"  (1.651 m)   Wt 191 lb 12.8 oz (87 kg)   SpO2 98%   BMI 31.92 kg/m  Wt Readings from Last 3 Encounters:  05/22/19 191 lb 12.8 oz (87 kg)  03/22/19 194 lb (88 kg)  03/15/19 194 lb (88 kg)   Depression screen [facility] 2/9 05/22/2019 05/10/2018 05/04/2017 04/28/2016 04/28/2016  Decreased Interest 0 0 0 0 0  Down, Depressed, Hopeless 0 0 0 1 1  PHQ - 2 Score 0 0 0 1 1  Altered sleeping - 0 0 - -  Tired, decreased energy - 0 0 - -  Change in appetite - 0 0 - -  Feeling bad or failure about yourself  - 0 0 - -  Trouble concentrating - 0 0 - -  Moving slowly or fidgety/restless - 0 0 - -  Suicidal thoughts - 0 0 - -  PHQ-9 Score - 0 0 - -  Difficult doing work/chores - Not difficult at all Not difficult at all - -       Assessment & Plan:  1. Annual physical exam - Discussed and encouraged healthy lifestyle choices- adequate sleep, regular exercise, stress management and healthy food choices.    2. Ulcerative proctitis with complication (HCC) - refill of  mesalamine provided, discussed follow up with GI if more than rare occurrences and for endoscopic follow up.  - mesalamine (CANASA) 1000 MG suppository; Place 1 suppository (1,000 mg total) rectally at bedtime as needed.  Dispense: 30 suppository; Refill: 0  3. Hyperglycemia - reviewed labs, patient  able to make healthier food choices with increased ability to ambulate.  - follow up in 6 months - instructed her to decrease simple carbs in diet   Clarene Reamer, FNP-BC  [facility], [place]  05/22/2019 1:56 PM

## 2019-05-31 ENCOUNTER — Other Ambulatory Visit: Payer: Self-pay | Admitting: Family Medicine

## 2019-05-31 DIAGNOSIS — K219 Gastro-esophageal reflux disease without esophagitis: Secondary | ICD-10-CM

## 2019-06-01 ENCOUNTER — Ambulatory Visit (INDEPENDENT_AMBULATORY_CARE_PROVIDER_SITE_OTHER): Payer: Medicare HMO

## 2019-06-01 DIAGNOSIS — Z23 Encounter for immunization: Secondary | ICD-10-CM | POA: Diagnosis not present

## 2019-06-06 DIAGNOSIS — M47816 Spondylosis without myelopathy or radiculopathy, lumbar region: Secondary | ICD-10-CM | POA: Diagnosis not present

## 2019-06-13 ENCOUNTER — Other Ambulatory Visit: Payer: Self-pay | Admitting: Family Medicine

## 2019-06-13 DIAGNOSIS — K51219 Ulcerative (chronic) proctitis with unspecified complications: Secondary | ICD-10-CM

## 2019-06-15 NOTE — Telephone Encounter (Signed)
Last OV 05/22/2019 CPE Last refilled 05/22/2019 #30  Please advise, thanks.

## 2019-06-16 NOTE — Telephone Encounter (Signed)
LM for call back

## 2019-06-16 NOTE — Telephone Encounter (Signed)
Please call the patient and see if she has used entire prescription of suppositories in one month? If so, please ask her about her symptoms.

## 2019-06-16 NOTE — Telephone Encounter (Signed)
Noted  

## 2019-06-16 NOTE — Telephone Encounter (Signed)
Pt states that she does not need this filled at this time.  Automatic refill request error.  No current sx's.  Nothing further needed.

## 2019-07-25 DIAGNOSIS — M5136 Other intervertebral disc degeneration, lumbar region: Secondary | ICD-10-CM | POA: Diagnosis not present

## 2019-07-31 ENCOUNTER — Other Ambulatory Visit: Payer: Self-pay | Admitting: Family Medicine

## 2019-07-31 DIAGNOSIS — G8929 Other chronic pain: Secondary | ICD-10-CM

## 2019-07-31 NOTE — Telephone Encounter (Signed)
Last refilled 05/07/2019 #90 x 0rf Last OV 05/22/2019 CPE  Please advise, thanks.

## 2019-08-16 DIAGNOSIS — M5136 Other intervertebral disc degeneration, lumbar region: Secondary | ICD-10-CM | POA: Diagnosis not present

## 2019-08-16 DIAGNOSIS — M47816 Spondylosis without myelopathy or radiculopathy, lumbar region: Secondary | ICD-10-CM | POA: Diagnosis not present

## 2019-08-16 DIAGNOSIS — M419 Scoliosis, unspecified: Secondary | ICD-10-CM | POA: Diagnosis not present

## 2019-08-22 DIAGNOSIS — M5136 Other intervertebral disc degeneration, lumbar region: Secondary | ICD-10-CM | POA: Diagnosis not present

## 2019-08-29 DIAGNOSIS — H2511 Age-related nuclear cataract, right eye: Secondary | ICD-10-CM | POA: Diagnosis not present

## 2019-08-29 DIAGNOSIS — I10 Essential (primary) hypertension: Secondary | ICD-10-CM | POA: Diagnosis not present

## 2019-08-29 DIAGNOSIS — H18413 Arcus senilis, bilateral: Secondary | ICD-10-CM | POA: Diagnosis not present

## 2019-08-29 DIAGNOSIS — H2513 Age-related nuclear cataract, bilateral: Secondary | ICD-10-CM | POA: Diagnosis not present

## 2019-08-29 DIAGNOSIS — H25043 Posterior subcapsular polar age-related cataract, bilateral: Secondary | ICD-10-CM | POA: Diagnosis not present

## 2019-09-05 ENCOUNTER — Other Ambulatory Visit: Payer: Self-pay

## 2019-09-07 ENCOUNTER — Other Ambulatory Visit: Payer: Self-pay

## 2019-09-07 ENCOUNTER — Ambulatory Visit (INDEPENDENT_AMBULATORY_CARE_PROVIDER_SITE_OTHER): Payer: Medicare HMO | Admitting: Internal Medicine

## 2019-09-07 ENCOUNTER — Encounter: Payer: Self-pay | Admitting: Internal Medicine

## 2019-09-07 DIAGNOSIS — M810 Age-related osteoporosis without current pathological fracture: Secondary | ICD-10-CM

## 2019-09-07 LAB — BASIC METABOLIC PANEL
BUN: 17 mg/dL (ref 6–23)
CO2: 28 mEq/L (ref 19–32)
Calcium: 10.2 mg/dL (ref 8.4–10.5)
Chloride: 102 mEq/L (ref 96–112)
Creatinine, Ser: 0.99 mg/dL (ref 0.40–1.20)
GFR: 55.35 mL/min — ABNORMAL LOW (ref >60.00)
Glucose, Bld: 119 mg/dL — ABNORMAL HIGH (ref 70–99)
Potassium: 4.3 mEq/L (ref 3.5–5.1)
Sodium: 139 mEq/L (ref 135–145)

## 2019-09-07 LAB — VITAMIN D 25 HYDROXY (VIT D DEFICIENCY, FRACTURES): VITD: 46.56 ng/mL (ref 30.00–100.00)

## 2019-09-07 MED ORDER — FORTEO 600 MCG/2.4ML ~~LOC~~ SOPN: 20.0000 ug | PEN_INJECTOR | Freq: Every day | SUBCUTANEOUS | 6 refills | Status: DC

## 2019-09-07 MED ORDER — FORTEO 600 MCG/2.4ML ~~LOC~~ SOPN: 20.0000 ug | PEN_INJECTOR | Freq: Every day | SUBCUTANEOUS | 11 refills | Status: DC

## 2019-09-07 NOTE — Patient Instructions (Signed)
-   Continue Forteo 20 mg daily  Until  September, 2021

## 2019-09-07 NOTE — Progress Notes (Signed)
Name: Brittney Pierce  MRN/ DOB: [id number], [birth year]    Age/ Sex: 69 y.o., female     PCP: Elby Beck, FNP   Reason for Endocrinology Evaluation: [place]     Initial Endocrinology Clinic Visit: 03/07/2019    PATIENT IDENTIFIER: [name] is a 69 y.o., female with a past medical history of HTN, Ulcerative Colitis ( in remission for the past 8 yr), scoliosis and GERD . She has followed with [facility] since 03/07/2019 for consultative assistance with management of her Osteoporosis.   HISTORICAL SUMMARY:  The pt has been diagnosed with osteoporosis in 2020 with a right neck femur T-score of -2.6 . Due to pending surgery for scoliosis it was recommended by the surgeon to start her on Forteo which was done in 03/2019.   She is intolerant to calcium citrate due to severe constipation which exacerbated her proctitis.   SUBJECTIVE:    Today (09/07/2019):  Ms. [name] is here for a follow up on osteoporosis.   She has occasional pain in her bones that moves around, but it eventually resolved.  No local reaction    Could not tolerate calcium citrate - developed constipation which leads to proctitis  Now she is on calcium and vitamin D chewables  ROS:  As per HPI.   HISTORY:  Past Medical History:  Past Medical History:  Diagnosis Date  . Disorder of bone and cartilage, unspecified   . Dysuria   . Esophageal reflux   . Lumbago   . Osteoarthrosis, unspecified whether generalized or localized, unspecified site   . Ulcerative colitis, unspecified   . Unspecified essential hypertension    Past Surgical History:  Past Surgical History:  Procedure Laterality Date  . CESAREAN SECTION     x2  . CHOLECYSTECTOMY    . [place] SURGERY  2008    Social History:  reports that she quit smoking about 39 years ago. She has never used smokeless tobacco. She reports current alcohol use. She reports that she does not use  drugs. Family History:  Family History  Problem Relation Age of Onset  . Heart attack Father 85  . Diabetes Father   . Lung cancer Mother      HOME MEDICATIONS: Allergies as of 09/07/2019      Reactions   Amoxicillin Diarrhea   Cephalexin    Prednisone Other (See Comments)   Sx's of heart attack - elevated BP, HR and chest tightness/pressure.   Bactrim Ds [sulfamethoxazole-trimethoprim] Other (See Comments)   Rash,itching,whelps, throat swelling and feels like lump in throat.   Codeine    REACTION: stomach upset   Doxycycline    GI side effects   Erythromycin    REACTION: stomach upset   Sulfamethizole Hives      Medication List       Accurate as of September 07, 2019 12:27 PM. If you have any questions, ask your nurse or doctor.        BIOTIN PO Take 1 capsule by mouth daily.   Calcium 1200-1000 MG-UNIT Chew Chew 1 tablet by mouth daily.   cetirizine 10 MG tablet Commonly known as: ZYRTEC Take 10 mg by mouth daily.   COLLAGEN PO Take 4 tablets by mouth daily.   diclofenac sodium 1 % Gel Commonly known as: VOLTAREN Apply topically as needed.   fluticasone 50 MCG/ACT nasal spray Commonly known as: FLONASE 2 sprays each nostril as directed.   Forteo 600 MCG/2.4ML Sopn Generic drug: Teriparatide (  Recombinant) Inject 20 mcg into the skin daily.   gabapentin 300 MG capsule Commonly known as: NEURONTIN Take 300 mg by mouth 2 (two) times daily.   meloxicam 15 MG tablet Commonly known as: MOBIC TAKE 1 TABLET DAILY   mesalamine 1000 MG suppository Commonly known as: CANASA Place 1 suppository (1,000 mg total) rectally at bedtime as needed.   metoprolol tartrate 25 MG tablet Commonly known as: LOPRESSOR Take 0.5 tablets (12.5 mg total) by mouth 2 (two) times daily.   MULTIVITAMIN PO Take 1 tablet by mouth daily.   omeprazole 40 MG capsule Commonly known as: PRILOSEC TAKE 1 CAPSULE DAILY   POTASSIUM GLUCONATE PO Take 1 capsule by mouth daily.    PROBIOTIC DAILY PO Take by mouth.         OBJECTIVE:   PHYSICAL EXAM: VS: BP 118/78 (BP Location: Left Arm, Patient Position: Sitting, Cuff Size: Large)   Pulse 71   Temp 98.1 F (36.7 C)   Ht 5' 5"  (1.651 m)   Wt 188 lb 12.8 oz (85.6 kg)   SpO2 98%   BMI 31.42 kg/m    EXAM: General: Pt appears well and is in NAD  Lungs: Clear with good BS bilat with no rales, rhonchi, or wheezes  Heart: Auscultation: RRR.  Abdomen: Normoactive bowel sounds, soft, nontender, without masses or organomegaly palpable  Extremities:  BL LE: No pretibial edema normal ROM and strength.  Mental Status: Judgment, insight: Intact Mood and affect: No depression, anxiety, or agitation     DATA REVIEWED:  Results for ROMEY, COHEA (MRN [id number]) as of 09/08/2019 16:40  Ref. Range 09/07/2019 11:30  Sodium Latest Ref Range: 135 - 145 mEq/L 139  Potassium Latest Ref Range: 3.5 - 5.1 mEq/L 4.3  Chloride Latest Ref Range: 96 - 112 mEq/L 102  CO2 Latest Ref Range: 19 - 32 mEq/L 28  Glucose Latest Ref Range: 70 - 99 mg/dL 119 (H)  BUN Latest Ref Range: 6 - 23 mg/dL 17  Creatinine Latest Ref Range: 0.40 - 1.20 mg/dL 0.99  Calcium Latest Ref Range: 8.4 - 10.5 mg/dL 10.2  GFR Latest Ref Range: >60.00 mL/min 55.35 (L)  VITD Latest Ref Range: 30.00 - 100.00 ng/mL 46.56     ASSESSMENT / PLAN / RECOMMENDATIONS:   1. Osteoporosis:   - She is tolerating forteo without side effects - She was unable to tolerate calcium citrate ( recommended due to better absorption profile in the setting of chronic PPI use) , she can continue her current chewable Ca/vit D - Will check BMP/vit D - Will repeat DXA around 03/2020. Will consider then what alternative anti-resorptive therapy to start on.   Medications   Forteo 20 mg daily  Calcium/ vitamin D  F/U in 15 minuts Signed electronically by: Mack Guise, MD  [facility]  [facility] [address] [place], [place] [address] Phone: [phone] FAX: [phone]      CC: Elby Beck, [place] [address] Phone: [phone]  Fax: [phone]   Return to Endocrinology clinic as below: Future Appointments  Date Time Provider [place]  12/04/2020 10:30 AM Erice Ahles, Melanie Crazier, MD [facility] None

## 2019-09-08 ENCOUNTER — Other Ambulatory Visit: Payer: Self-pay

## 2019-09-08 ENCOUNTER — Encounter: Payer: Self-pay | Admitting: Internal Medicine

## 2019-09-08 DIAGNOSIS — M810 Age-related osteoporosis without current pathological fracture: Secondary | ICD-10-CM

## 2019-09-08 MED ORDER — FORTEO 600 MCG/2.4ML ~~LOC~~ SOPN: 20.0000 ug | PEN_INJECTOR | Freq: Every day | SUBCUTANEOUS | 11 refills | Status: DC

## 2019-09-12 DIAGNOSIS — M419 Scoliosis, unspecified: Secondary | ICD-10-CM | POA: Diagnosis not present

## 2019-09-12 DIAGNOSIS — M47816 Spondylosis without myelopathy or radiculopathy, lumbar region: Secondary | ICD-10-CM | POA: Diagnosis not present

## 2019-09-12 DIAGNOSIS — Z6831 Body mass index (BMI) 31.0-31.9, adult: Secondary | ICD-10-CM | POA: Diagnosis not present

## 2019-09-12 DIAGNOSIS — M5136 Other intervertebral disc degeneration, lumbar region: Secondary | ICD-10-CM | POA: Diagnosis not present

## 2019-09-15 DIAGNOSIS — H2511 Age-related nuclear cataract, right eye: Secondary | ICD-10-CM | POA: Diagnosis not present

## 2019-09-15 DIAGNOSIS — Z961 Presence of intraocular lens: Secondary | ICD-10-CM | POA: Diagnosis not present

## 2019-09-15 DIAGNOSIS — H2512 Age-related nuclear cataract, left eye: Secondary | ICD-10-CM | POA: Diagnosis not present

## 2019-09-17 ENCOUNTER — Ambulatory Visit: Payer: Medicare HMO | Attending: Internal Medicine

## 2019-09-17 DIAGNOSIS — Z23 Encounter for immunization: Secondary | ICD-10-CM | POA: Insufficient documentation

## 2019-09-17 NOTE — Progress Notes (Signed)
   Covid-19 Vaccination Clinic  Name:  Leslyn Monda    MRN: [id number] DOB: August 18, 1950  09/17/2019  Ms. Keddy was observed post Covid-19 immunization for 15 minutes without incidence. She was provided with Vaccine Information Sheet and instruction to access the V-Safe system.   Ms. Lasota was instructed to call 911 with any severe reactions post vaccine: Marland Kitchen Difficulty breathing  . Swelling of your face and throat  . A fast heartbeat  . A bad rash all over your body  . Dizziness and weakness    Immunizations Administered    Name Date Dose VIS Date Route   Pfizer COVID-19 Vaccine 09/17/2019 12:23 PM 0.3 mL 07/07/2019 Intramuscular   Manufacturer: [place]   Lot: [id number]   [place]: [id number]

## 2019-09-22 ENCOUNTER — Other Ambulatory Visit: Payer: Self-pay | Admitting: Family Medicine

## 2019-10-10 ENCOUNTER — Ambulatory Visit: Payer: Medicare HMO | Attending: Internal Medicine

## 2019-10-10 DIAGNOSIS — Z23 Encounter for immunization: Secondary | ICD-10-CM

## 2019-10-10 NOTE — Progress Notes (Signed)
   Covid-19 Vaccination Clinic  Name:  Avenly Roberge    MRN: [id number] DOB: 12-06-1950  10/10/2019  Ms. Bonelli was observed post Covid-19 immunization for 15 minutes without incident. She was provided with Vaccine Information Sheet and instruction to access the V-Safe system.   Ms. Wolpert was instructed to call 911 with any severe reactions post vaccine: Marland Kitchen Difficulty breathing  . Swelling of face and throat  . A fast heartbeat  . A bad rash all over body  . Dizziness and weakness   Immunizations Administered    Name Date Dose VIS Date Route   Pfizer COVID-19 Vaccine 10/10/2019 11:28 AM 0.3 mL 07/07/2019 Intramuscular   Manufacturer: [place]   Lot: [id number]   [place]: [id number]

## 2019-10-13 DIAGNOSIS — Z961 Presence of intraocular lens: Secondary | ICD-10-CM | POA: Diagnosis not present

## 2019-10-13 DIAGNOSIS — H2512 Age-related nuclear cataract, left eye: Secondary | ICD-10-CM | POA: Diagnosis not present

## 2019-10-23 ENCOUNTER — Other Ambulatory Visit: Payer: Self-pay | Admitting: Family Medicine

## 2019-10-23 ENCOUNTER — Encounter: Payer: Self-pay | Admitting: Family Medicine

## 2019-10-23 DIAGNOSIS — G8929 Other chronic pain: Secondary | ICD-10-CM

## 2019-10-23 NOTE — Telephone Encounter (Signed)
Meloxicam last refilled on 07/31/19 for #90 with 0 refills. Patient was last seen in 05/22/19 for CPE and has no upcoming appts. Is this ok to refill?

## 2019-11-10 DIAGNOSIS — H2512 Age-related nuclear cataract, left eye: Secondary | ICD-10-CM | POA: Diagnosis not present

## 2019-11-10 DIAGNOSIS — Z961 Presence of intraocular lens: Secondary | ICD-10-CM | POA: Diagnosis not present

## 2019-11-10 DIAGNOSIS — H2511 Age-related nuclear cataract, right eye: Secondary | ICD-10-CM | POA: Diagnosis not present

## 2019-11-11 ENCOUNTER — Other Ambulatory Visit: Payer: Self-pay | Admitting: Family Medicine

## 2019-11-11 DIAGNOSIS — K219 Gastro-esophageal reflux disease without esophagitis: Secondary | ICD-10-CM

## 2019-11-14 DIAGNOSIS — M5136 Other intervertebral disc degeneration, lumbar region: Secondary | ICD-10-CM | POA: Diagnosis not present

## 2019-11-20 ENCOUNTER — Other Ambulatory Visit (INDEPENDENT_AMBULATORY_CARE_PROVIDER_SITE_OTHER): Payer: Medicare HMO | Admitting: Family Medicine

## 2019-11-20 ENCOUNTER — Other Ambulatory Visit (INDEPENDENT_AMBULATORY_CARE_PROVIDER_SITE_OTHER): Payer: Medicare HMO

## 2019-11-20 ENCOUNTER — Other Ambulatory Visit: Payer: Self-pay

## 2019-11-20 DIAGNOSIS — R739 Hyperglycemia, unspecified: Secondary | ICD-10-CM

## 2019-11-20 DIAGNOSIS — I10 Essential (primary) hypertension: Secondary | ICD-10-CM

## 2019-11-20 LAB — COMPREHENSIVE METABOLIC PANEL
ALT: 14 U/L (ref 0–35)
AST: 18 U/L (ref 0–37)
Albumin: 3.9 g/dL (ref 3.5–5.2)
Alkaline Phosphatase: 135 U/L — ABNORMAL HIGH (ref 39–117)
BUN: 15 mg/dL (ref 6–23)
CO2: 29 mEq/L (ref 19–32)
Calcium: 9.5 mg/dL (ref 8.4–10.5)
Chloride: 101 mEq/L (ref 96–112)
Creatinine, Ser: 0.92 mg/dL (ref 0.40–1.20)
GFR: 60.2 mL/min (ref >60.00)
Glucose, Bld: 131 mg/dL — ABNORMAL HIGH (ref 70–99)
Potassium: 4 mEq/L (ref 3.5–5.1)
Sodium: 138 mEq/L (ref 135–145)
Total Bilirubin: 0.7 mg/dL (ref 0.2–1.2)
Total Protein: 6.3 g/dL (ref 6.0–8.3)

## 2019-11-20 LAB — HEMOGLOBIN A1C: Hgb A1c MFr Bld: 6.7 % — ABNORMAL HIGH (ref 4.6–6.5)

## 2019-11-24 ENCOUNTER — Other Ambulatory Visit: Payer: Self-pay

## 2019-11-24 ENCOUNTER — Ambulatory Visit (INDEPENDENT_AMBULATORY_CARE_PROVIDER_SITE_OTHER): Payer: Medicare HMO | Admitting: Family Medicine

## 2019-11-24 ENCOUNTER — Encounter: Payer: Self-pay | Admitting: Family Medicine

## 2019-11-24 VITALS — BP 100/68 | HR 86 | Temp 97.4°F | Ht 65.0 in | Wt 187.0 lb

## 2019-11-24 DIAGNOSIS — I10 Essential (primary) hypertension: Secondary | ICD-10-CM | POA: Diagnosis not present

## 2019-11-24 DIAGNOSIS — R7303 Prediabetes: Secondary | ICD-10-CM | POA: Diagnosis not present

## 2019-11-24 DIAGNOSIS — R221 Localized swelling, mass and lump, neck: Secondary | ICD-10-CM

## 2019-11-24 DIAGNOSIS — R748 Abnormal levels of other serum enzymes: Secondary | ICD-10-CM | POA: Diagnosis not present

## 2019-11-24 DIAGNOSIS — J302 Other seasonal allergic rhinitis: Secondary | ICD-10-CM | POA: Diagnosis not present

## 2019-11-24 DIAGNOSIS — K219 Gastro-esophageal reflux disease without esophagitis: Secondary | ICD-10-CM | POA: Diagnosis not present

## 2019-11-24 NOTE — Patient Instructions (Signed)
Good to see you today  For throat redness/ lump feeling- unsure if related to your allergies/ post nasal drip or esophageal reflux. Let's try to change your antihistamine first, try a two week trial of over the counter Allegra or Claritin (generic is fine), saline nasal spray after being outside and as needed. Antihistamine eye drops as needed.   If not better in 2 weeks, let me know and we will switch your heartburn medication from omeprazole to a different medication in same category.   Follow up in 6 months for your physical  Good luck with your move

## 2019-11-24 NOTE — Progress Notes (Signed)
Subjective:    Patient ID: Brittney Pierce, female    DOB: [birth year], 69 y.o.   MRN: [id number]  HPI Chief Complaint  Patient presents with  . Follow-up   This is a 69 yo female who presents today for 6 month follow up of chronic medical conditions. She had labs prior to visit.   Chronic back pain- limited in her activity level, sleeping ok. On Forteo to build bones for possible surgery in future.   Had cataracts removed. Now needs readers. Tolerated well.   Feels like lymph nodes are swollen in neck, questionable pain. Feels a lump in her throat. No problems with swallowing regularly, maybe harder than it used to be. ? More GERD. Worse with chocolate at night, avoids citrus. Allergies have been bad, taking daily cetirizine. Has been working in her garage and packing for upcoming move. Runny, itchy eyes. Post nasal drainage.    Review of Systems Per HPI    Objective:   Physical Exam Vitals reviewed.  Constitutional:      General: She is not in acute distress.    Appearance: Normal appearance. She is obese. She is not ill-appearing, toxic-appearing or diaphoretic.  HENT:     Head: Normocephalic and atraumatic.     Right Ear: External ear normal.     Left Ear: External ear normal.     Mouth/Throat:     Mouth: Mucous membranes are moist.     Pharynx: Oropharynx is clear. Posterior oropharyngeal erythema present.  Eyes:     Conjunctiva/sclera: Conjunctivae normal.  Cardiovascular:     Rate and Rhythm: Normal rate and regular rhythm.     Heart sounds: Normal heart sounds.  Pulmonary:     Effort: Pulmonary effort is normal.     Breath sounds: Normal breath sounds.  Musculoskeletal:     Cervical back: Normal range of motion and neck supple. No rigidity or tenderness.     Right lower leg: No edema.     Left lower leg: No edema.  Skin:    General: Skin is warm and dry.  Neurological:     Mental Status: She is alert and oriented to person, place, and time.        BP 100/68 (BP Location: Left Arm, Patient Position: Sitting, Cuff Size: Normal)   Pulse 86   Temp (!) 97.4 F (36.3 C) (Temporal)   Ht 5' 5"  (1.651 m)   Wt 187 lb (84.8 kg)   SpO2 98%   BMI 31.12 kg/m  Wt Readings from Last 3 Encounters:  11/24/19 187 lb (84.8 kg)  09/07/19 188 lb 12.8 oz (85.6 kg)  05/22/19 191 lb 12.8 oz (87 kg)       Assessment & Plan:  1. Essential hypertension - well controlled  2. Prediabetes - improved hemoglobin a1c, has lost 10 pounds over last year, encouraged continued healthy food choices  3. Seasonal allergic rhinitis, unspecified trigger - will try changing antihistamine from cetirizine to either fexofenadine or loratadine - saline nasal spray after being outdoors and prn, antihistamine eye drops prn  4. Sensation of lump in throat - not sure if related to PND or GERD, will try addressing allergic rhinitis symptoms first, if no improvement in 2 weeks, she will let me know and will change PPI  5. Gastroesophageal reflux disease, unspecified whether esophagitis present - per #4, if no improvement, refer to GI  6. Elevated serum alkaline phosphatase level - mildly elevated, possibly due to [facility] - recheck in 6  months  - follow up in 6 months for CPE, will discuss colon cancer screening further, does not want to consider repeat colonoscopy but will consider cologuard.   This visit occurred during the SARS-CoV-2 public health emergency.  Safety protocols were in place, including screening questions prior to the visit, additional usage of staff PPE, and extensive cleaning of exam room while observing appropriate contact time as indicated for disinfecting solutions.      Clarene Reamer, FNP-BC  [facility] at [facility]  11/24/2019 12:56 PM

## 2019-12-08 ENCOUNTER — Encounter: Payer: Self-pay | Admitting: Family Medicine

## 2019-12-11 ENCOUNTER — Encounter: Payer: Self-pay | Admitting: Family Medicine

## 2019-12-11 ENCOUNTER — Other Ambulatory Visit: Payer: Self-pay | Admitting: Family Medicine

## 2019-12-11 DIAGNOSIS — K51219 Ulcerative (chronic) proctitis with unspecified complications: Secondary | ICD-10-CM

## 2019-12-11 DIAGNOSIS — K219 Gastro-esophageal reflux disease without esophagitis: Secondary | ICD-10-CM

## 2019-12-11 MED ORDER — MESALAMINE 1000 MG RE SUPP: 1000.0000 mg | Freq: Every evening | RECTAL | 0 refills | Status: DC | PRN

## 2019-12-11 MED ORDER — ESOMEPRAZOLE MAGNESIUM 40 MG PO CPDR: 40.0000 mg | DELAYED_RELEASE_CAPSULE | Freq: Every day | ORAL | 3 refills | Status: DC

## 2019-12-13 DIAGNOSIS — M5136 Other intervertebral disc degeneration, lumbar region: Secondary | ICD-10-CM | POA: Diagnosis not present

## 2019-12-13 DIAGNOSIS — Z6831 Body mass index (BMI) 31.0-31.9, adult: Secondary | ICD-10-CM | POA: Diagnosis not present

## 2019-12-13 DIAGNOSIS — M47816 Spondylosis without myelopathy or radiculopathy, lumbar region: Secondary | ICD-10-CM | POA: Diagnosis not present

## 2019-12-13 DIAGNOSIS — M419 Scoliosis, unspecified: Secondary | ICD-10-CM | POA: Diagnosis not present

## 2019-12-19 ENCOUNTER — Encounter: Payer: Self-pay | Admitting: Primary Care

## 2019-12-19 ENCOUNTER — Telehealth: Payer: Self-pay | Admitting: Family Medicine

## 2019-12-19 ENCOUNTER — Ambulatory Visit (INDEPENDENT_AMBULATORY_CARE_PROVIDER_SITE_OTHER): Payer: Medicare HMO | Admitting: Primary Care

## 2019-12-19 ENCOUNTER — Other Ambulatory Visit: Payer: Self-pay

## 2019-12-19 VITALS — BP 128/80 | HR 88 | Temp 97.2°F | Ht 65.0 in | Wt 188.0 lb

## 2019-12-19 DIAGNOSIS — R300 Dysuria: Secondary | ICD-10-CM

## 2019-12-19 LAB — POC URINALSYSI DIPSTICK (AUTOMATED)
Bilirubin, UA: NEGATIVE
Glucose, UA: NEGATIVE
Ketones, UA: NEGATIVE
Nitrite, UA: NEGATIVE
Protein, UA: NEGATIVE
Spec Grav, UA: 1.01 (ref 1.010–1.025)
Urobilinogen, UA: 0.2 E.U./dL
pH, UA: 7 (ref 5.0–8.0)

## 2019-12-19 MED ORDER — CIPROFLOXACIN HCL 250 MG PO TABS: 250.0000 mg | ORAL_TABLET | Freq: Two times a day (BID) | ORAL | 0 refills | Status: DC

## 2019-12-19 NOTE — Assessment & Plan Note (Signed)
Acute for the last 2-3 days, also with urinary frequency.  UA today with 1+ leuks, 2+ blood. Culture sent.  Blood could be secondary to hemorrhoid from recent ulcerative colitis flare, she does deny rectal bleeding.  Given symptoms and history, will treat. She prefers Cipro as this historically works. Discussed to continue water intake. Will send Cipro course to pharmacy, await culture results.

## 2019-12-19 NOTE — Patient Instructions (Signed)
Start ciprofloxacin for presumed urinary tract infection. Take 1 tablet by mouth twice daily for 3 days.  We will be in touch regarding your urine culture results by late Thursday afternoon.  It was a pleasure meeting you!

## 2019-12-19 NOTE — Progress Notes (Signed)
Subjective:    Patient ID: Brittney Pierce, female    DOB: [birth year], 69 y.o.   MRN: [id number]  HPI  This visit occurred during the SARS-CoV-2 public health emergency.  Safety protocols were in place, including screening questions prior to the visit, additional usage of staff PPE, and extensive cleaning of exam room while observing appropriate contact time as indicated for disinfecting solutions.   [name] is a 69 year old female patient of Tor Netters with a medical history of UTI, ulcerative colitis, GERD, hypertension, post menopause who presents today with a chief complaint of dysuria.  She also reports urinary frequency, chills down her legs. She denies hematuria, pelvic pressure, fevers, abdominal rectal bleeding. Symptoms began 2-3 days ago.   She's been drinking a lot of water, but did have a recent ulcerative colitis flare. She will occasionally experience UTI just after an ulcerative colitis  Flares. She does notice an external hemorrhoid, denies rectal bleeding.   She's not taken anything OTC.   BP Readings from Last 3 Encounters:  12/19/19 128/80  11/24/19 100/68  09/07/19 118/78     Review of Systems  Constitutional: Negative for fever.  Gastrointestinal: Negative for abdominal pain and blood in stool.  Genitourinary: Positive for dysuria and frequency. Negative for hematuria and vaginal discharge.       Past Medical History:  Diagnosis Date  . Disorder of bone and cartilage, unspecified   . Dysuria   . Esophageal reflux   . Lumbago   . Osteoarthrosis, unspecified whether generalized or localized, unspecified site   . Ulcerative colitis, unspecified   . Unspecified essential hypertension      Social History   Socioeconomic History  . Marital status: Widowed    Spouse name: Not on file  . Number of children: Not on file  . Years of education: Not on file  . Highest education level: Not on file  Occupational History  . Occupation:  Retired  Tobacco Use  . Smoking status: Former Smoker    Quit date: 07/27/1980    Years since quitting: 39.4  . Smokeless tobacco: Never Used  Substance and Sexual Activity  . Alcohol use: Yes    Comment: Once a month (maybe)  . Drug use: No  . Sexual activity: Not Currently  Other Topics Concern  . Not on file  Social History Narrative   Regular exercise-yes      Moved here from [place] to be closer to grandchildren      Likes to garden      Does have a living will.  Husband is HPOA.   Would desire life support, no prolonged life support if futile.            Social Determinants of Health   Financial Resource Strain:   . Difficulty of Paying Living Expenses:   Food Insecurity:   . Worried About Charity fundraiser in the Last Year:   . Arboriculturist in the Last Year:   Transportation Needs:   . Film/video editor (Medical):   Marland Kitchen Lack of Transportation (Non-Medical):   Physical Activity:   . Days of Exercise per Week:   . Minutes of Exercise per Session:   Stress:   . Feeling of Stress :   Social Connections:   . Frequency of Communication with Friends and Family:   . Frequency of Social Gatherings with Friends and Family:   . Attends Religious Services:   . Active Member of Clubs  or Organizations:   . Attends Archivist Meetings:   Marland Kitchen Marital Status:   Intimate Partner Violence:   . Fear of Current or Ex-Partner:   . Emotionally Abused:   Marland Kitchen Physically Abused:   . Sexually Abused:     Past Surgical History:  Procedure Laterality Date  . CESAREAN SECTION     x2  . CHOLECYSTECTOMY    . [place] SURGERY  2008    Family History  Problem Relation Age of Onset  . Heart attack Father 7  . Diabetes Father   . Lung cancer Mother     Allergies  Allergen Reactions  . Amoxicillin Diarrhea  . Cephalexin   . Prednisone Other (See Comments)    Sx's of heart attack - elevated BP, HR and chest tightness/pressure.  . Bactrim Ds  [Sulfamethoxazole-Trimethoprim] Other (See Comments)    Rash,itching,whelps, throat swelling and feels like lump in throat.  . Codeine     REACTION: stomach upset  . Doxycycline     GI side effects  . Erythromycin     REACTION: stomach upset  . Sulfamethizole Hives    Current Outpatient Medications on File Prior to Visit  Medication Sig Dispense Refill  . BIOTIN PO Take 1 capsule by mouth daily.    . Calcium 1200-1000 MG-UNIT CHEW Chew 1 tablet by mouth daily.      . cetirizine (ZYRTEC) 10 MG tablet Take 10 mg by mouth daily.      . COLLAGEN PO Take 4 tablets by mouth daily.    . diclofenac sodium (VOLTAREN) 1 % GEL Apply topically as needed.     Marland Kitchen esomeprazole (NEXIUM) 40 MG capsule Take 1 capsule (40 mg total) by mouth daily. Take at least 1 hour before a meal. 30 capsule 3  . fluticasone (FLONASE) 50 MCG/ACT nasal spray 2 sprays each nostril as directed. 48 g 1  . gabapentin (NEURONTIN) 300 MG capsule Take 300 mg by mouth 2 (two) times daily.     . meloxicam (MOBIC) 15 MG tablet TAKE 1 TABLET DAILY 90 tablet 0  . mesalamine (CANASA) 1000 MG suppository Place 1 suppository (1,000 mg total) rectally at bedtime as needed. 30 suppository 0  . metoprolol tartrate (LOPRESSOR) 25 MG tablet TAKE 1/2 TABLET TWICE A DAY 90 tablet 1  . Multiple Vitamins-Minerals (MULTIVITAMIN PO) Take 1 tablet by mouth daily.    Marland Kitchen omeprazole (PRILOSEC) 40 MG capsule TAKE 1 CAPSULE DAILY 90 capsule 1  . POTASSIUM GLUCONATE PO Take 1 capsule by mouth daily.    . Probiotic Product (PROBIOTIC DAILY PO) Take by mouth.    . Teriparatide, Recombinant, (FORTEO) 600 MCG/2.4ML SOPN Inject 20 mcg into the skin daily. 3 pen 11   No current facility-administered medications on file prior to visit.    BP 128/80   Pulse 88   Temp (!) 97.2 F (36.2 C) (Temporal)   Ht 5' 5"  (1.651 m)   Wt 188 lb (85.3 kg)   SpO2 98%   BMI 31.28 kg/m    Objective:   Physical Exam  Constitutional: She appears well-nourished.    Cardiovascular: Normal rate and regular rhythm.  Respiratory: Effort normal and breath sounds normal.  GI: Soft. There is no abdominal tenderness. There is no CVA tenderness.  Musculoskeletal:     Cervical back: Neck supple.  Skin: Skin is warm and dry.           Assessment & Plan:

## 2019-12-19 NOTE — Progress Notes (Signed)
  Chronic Care Management   Note  12/19/2019 Name: Brittney Pierce MRN: [id number] DOB: 08/23/1950  Brittney Pierce is a 69 y.o. year old female who is a primary care patient of Elby Beck, FNP. I reached out to [facility] by phone today in response to a referral sent by Brittney Pierce PCP, Carlean Purl Dalbert Batman, FNP.   Ms. Lashway was given information about Chronic Care Management services today including:  1. CCM service includes personalized support from designated clinical staff supervised by her physician, including individualized plan of care and coordination with other care providers 2. 24/7 contact phone numbers for assistance for urgent and routine care needs. 3. Service will only be billed when office clinical staff spend 20 minutes or more in a month to coordinate care. 4. Only one practitioner may furnish and bill the service in a calendar month. 5. The patient may stop CCM services at any time (effective at the end of the month) by phone call to the office staff.   Patient agreed to services and verbal consent obtained.   This note is not being shared with the patient for the following reason: To respect privacy (The patient or proxy has requested that the information not be shared). Follow up plan:   Earney [place] Upstream Scheduler

## 2019-12-21 LAB — URINE CULTURE
MICRO NUMBER:: 10517317
SPECIMEN QUALITY:: ADEQUATE

## 2020-01-03 ENCOUNTER — Other Ambulatory Visit: Payer: Self-pay | Admitting: Family Medicine

## 2020-01-03 DIAGNOSIS — K51219 Ulcerative (chronic) proctitis with unspecified complications: Secondary | ICD-10-CM

## 2020-01-04 ENCOUNTER — Other Ambulatory Visit: Payer: Self-pay | Admitting: Family Medicine

## 2020-01-04 NOTE — Telephone Encounter (Signed)
[place] for refills?  Last refill 12/11/19 330 x 0 refills  Upcoming OV 05/2020 with Tor Netters, FNP

## 2020-01-14 ENCOUNTER — Other Ambulatory Visit: Payer: Self-pay | Admitting: Family Medicine

## 2020-01-14 DIAGNOSIS — G8929 Other chronic pain: Secondary | ICD-10-CM

## 2020-01-15 NOTE — Telephone Encounter (Signed)
Last filled  2 months ago (10/23/2019)  meloxicam (MOBIC) 15 MG tablet  TAKE 1 TABLET DAILY  Dispense: 90 tablet     Refills: 0     Start: 10/23/2019      By:  Elby Beck, FNP    Upcoming appt 06/05/2020  Please advise, thanks.

## 2020-01-23 ENCOUNTER — Encounter: Payer: Self-pay | Admitting: Family Medicine

## 2020-01-23 DIAGNOSIS — K219 Gastro-esophageal reflux disease without esophagitis: Secondary | ICD-10-CM

## 2020-01-24 MED ORDER — ESOMEPRAZOLE MAGNESIUM 40 MG PO CPDR: 40.0000 mg | DELAYED_RELEASE_CAPSULE | Freq: Every day | ORAL | 3 refills | Status: DC

## 2020-01-27 ENCOUNTER — Other Ambulatory Visit: Payer: Self-pay | Admitting: Family Medicine

## 2020-01-27 DIAGNOSIS — K51219 Ulcerative (chronic) proctitis with unspecified complications: Secondary | ICD-10-CM

## 2020-01-30 NOTE — Telephone Encounter (Signed)
Please call and see if she needs a refill. This is a prn medication that should not be out of already.

## 2020-01-30 NOTE — Telephone Encounter (Signed)
[place] for refills?   Last refill 01/04/20 #30 x 0 refills  Upcoming OV 06/05/20

## 2020-02-07 DIAGNOSIS — M5136 Other intervertebral disc degeneration, lumbar region: Secondary | ICD-10-CM | POA: Diagnosis not present

## 2020-02-15 DIAGNOSIS — L570 Actinic keratosis: Secondary | ICD-10-CM | POA: Diagnosis not present

## 2020-02-15 DIAGNOSIS — L578 Other skin changes due to chronic exposure to nonionizing radiation: Secondary | ICD-10-CM | POA: Diagnosis not present

## 2020-02-15 DIAGNOSIS — Z85828 Personal history of other malignant neoplasm of skin: Secondary | ICD-10-CM | POA: Diagnosis not present

## 2020-02-15 DIAGNOSIS — L821 Other seborrheic keratosis: Secondary | ICD-10-CM | POA: Diagnosis not present

## 2020-02-15 DIAGNOSIS — Z872 Personal history of diseases of the skin and subcutaneous tissue: Secondary | ICD-10-CM | POA: Diagnosis not present

## 2020-02-20 ENCOUNTER — Other Ambulatory Visit: Payer: Self-pay

## 2020-02-20 ENCOUNTER — Ambulatory Visit: Payer: Medicare HMO

## 2020-02-20 DIAGNOSIS — M1990 Unspecified osteoarthritis, unspecified site: Secondary | ICD-10-CM

## 2020-02-20 DIAGNOSIS — R7303 Prediabetes: Secondary | ICD-10-CM

## 2020-02-20 NOTE — Chronic Care Management (AMB) (Signed)
Chronic Care Management Pharmacy  Name: Brittney Pierce  MRN: [id number] DOB: [birth year]  Chief Complaint/ HPI  Brittney Pierce,  69 y.o., female presents for their Initial CCM visit with the clinical pharmacist via telephone.  PCP : Elby Beck, FNP  Their chronic conditions include: HTN, GERD, OA, osteopenia, pre-diabetes, anxiety, insomnia, ulcerative colitis  Patient concerns: denies medication concerns or recent medication changes, reports doing well overall   Office Visits:  12/08/19: Patient f/u question - pt switched to Claritin for allergies with some improvement, still having lump in throat so pcp switched omeprazole to esomeprazole  11/24/19: PCP visit - allergies, change cetirizine to fexofenadine or loratadine, saline nasal spray PRN, antihistamine eye drops PRN; lump in throat, PND or GERD related unclear, try addressing allergy symptoms first, she will let me know if no improvement, then discuss changing PPI, HTN and pre DM well controlled, mildly elevated alkaline phos, recheck in 6 months  Consult Visit:  09/07/19: Endocrinology - age related osteoporosis, diagnosed with osteoporosis in 2020 with a right neck femur T-score of -2.6, started on Forteo 03/2019, intolerant to calcium citrate due to constipation, continue calcium/vitamin D chewables, tolerating Forteo well, repeat labs, repeat DXA 03/2020. Then consider anti-resorptive therapy.  Allergies  Allergen Reactions  . Amoxicillin Diarrhea  . Cephalexin   . Prednisone Other (See Comments)    Sx's of heart attack - elevated BP, HR and chest tightness/pressure.  . Bactrim Ds [Sulfamethoxazole-Trimethoprim] Other (See Comments)    Rash,itching,whelps, throat swelling and feels like lump in throat.  . Codeine     REACTION: stomach upset  . Doxycycline     GI side effects  . Erythromycin     REACTION: stomach upset  . Sulfamethizole Hives   Medications: Outpatient Encounter  Medications as of 02/20/2020  Medication Sig  . Calcium 1200-1000 MG-UNIT CHEW Chew 1 tablet by mouth daily.    . cetirizine (ZYRTEC) 10 MG tablet Take 10 mg by mouth daily.    . COLLAGEN PO Take 3 tablets by mouth daily. NeoCell Super Collagen C - takes 3 daily for hair, skin, nails  . Cranberry-Vitamin C-Vitamin E 4200-20-3 MG-MG-UNIT CAPS Take by mouth. 1 daily  . diclofenac sodium (VOLTAREN) 1 % GEL Apply topically as needed.   Marland Kitchen esomeprazole (NEXIUM) 40 MG capsule Take 1 capsule (40 mg total) by mouth daily. Take at least 1 hour before a meal.  . fluticasone (FLONASE) 50 MCG/ACT nasal spray 2 sprays each nostril as directed.  . gabapentin (NEURONTIN) 300 MG capsule Take 300 mg by mouth 2 (two) times daily.   . meloxicam (MOBIC) 15 MG tablet TAKE 1 TABLET DAILY  . mesalamine (CANASA) 1000 MG suppository PLACE 1 SUPPOSITORY (1,000 MG TOTAL) RECTALLY AT BEDTIME AS NEEDED.  . metoprolol tartrate (LOPRESSOR) 25 MG tablet TAKE 1/2 TABLET TWICE A DAY  . Multiple Vitamins-Minerals (MULTIVITAMIN PO) Take 1 tablet by mouth daily.  Marland Kitchen omeprazole (PRILOSEC) 40 MG capsule TAKE 1 CAPSULE DAILY  . POTASSIUM GLUCONATE PO Take 1 capsule by mouth daily.  . Probiotic Product (PROBIOTIC DAILY PO) Take by mouth.  . Teriparatide, Recombinant, (FORTEO) 600 MCG/2.4ML SOPN Inject 20 mcg into the skin daily.  Marland Kitchen BIOTIN PO Take 1 capsule by mouth daily. (Patient not taking: Reported on 02/20/2020)  . ciprofloxacin (CIPRO) 250 MG tablet Take 1 tablet (250 mg total) by mouth 2 (two) times daily. (Patient not taking: Reported on 02/20/2020)   No facility-administered encounter medications on file as of 02/20/2020.  Current Diagnosis/Assessment:  SDOH Interventions     Most Recent Value  SDOH Interventions  Financial Strain Interventions Intervention Not Indicated     Goals Addressed            This Visit's Progress   . Pharmacy Care Plan       CARE PLAN ENTRY  Current Barriers:  . Chronic Disease  Management support, education, and care coordination needs related to Osteoarthritis and Pre-Diabetes  Pre-Diabetes Lab Results  Component Value Date/Time   HGBA1C 6.7 (H) 11/20/2019 09:43 AM   HGBA1C 7.2 (H) 05/16/2019 10:14 AM .  Pharmacist Clinical Goal(s): o Over the next 6 months, patient will work with PharmD and providers to maintain A1c goal <7% . Current regimen:  o No pharmacotherapy . Interventions: o Recommend lifestyle management with heart healthy diet, low in carbohydrates.  . Patient self care activities - Over the next 6 months, patient will: . Incorporate a healthy diet high in vegetables, fruits and whole grains with low-fat dairy products, chicken, fish, legumes, non-tropical vegetable oils and nuts. Limit intake of sweets, sugar-sweetened beverages and red meats.   Chronic Pain/Osteoarthritis  . Pharmacist Clinical Goal(s) o Over the next 6 month, patient will work with PharmD and providers to improve to pain scale. . Current regimen:   Meloxicam 15 mg - 1 tablet daily  Gabapentin 300 mg - 1 at 4:30 PM and 1 at bedtime  Diclofenac gel 1% - apply up to four times daily as needed  TENS unit - uses occasionally  Lidocaine patches - uses occasionally . Interventions: o Discussed benefits of scheduling Tylenol throughout the day for arthritis pain . Patient self care activities - Over the next 6 months, patient will: o May consider trying Tylenol Arthritis 650 mg every 8 hours   Initial goal documentation      Hypertension   CMP Latest Ref Rng & Units 11/20/2019 09/07/2019 03/22/2019  Glucose 70 - 99 mg/dL 131(H) 119(H) 126(H)  BUN 6 - 23 mg/dL 15 17 19   Creatinine 0.40 - 1.20 mg/dL 0.92 0.99 0.88  Sodium 135 - 145 mEq/L 138 139 140  Potassium 3.5 - 5.1 mEq/L 4.0 4.3 3.5  Chloride 96 - 112 mEq/L 101 102 102  CO2 19 - 32 mEq/L 29 28 28   Calcium 8.4 - 10.5 mg/dL 9.5 10.2 9.5  Total Protein 6.0 - 8.3 g/dL 6.3 - -  Total Bilirubin 0.2 - 1.2 mg/dL 0.7 - -    Alkaline Phos 39 - 117 U/L 135(H) - -  AST 0 - 37 U/L 18 - -  ALT 0 - 35 U/L 14 - -   Office blood pressures are: BP Readings from Last 3 Encounters:  12/19/19 128/80  11/24/19 100/68  09/07/19 118/78   Patient has failed these meds in the past: none reported Patient checks BP at home: has a home monitor, checks if not feeling well Patient home BP readings are ranging: none reported  BP goal < 130/80 mmHg Patient is currently controlled on the following medications:   Metoprolol tartrate 25 mg - 1/2 tablet BID  Adherence: refills timely  Clinic readings are within goal, doing well on current therapy  Plan: Continue current medications  Pre-Diabetes   Recent Relevant Labs: Lab Results  Component Value Date/Time   HGBA1C 6.7 (H) 11/20/2019 09:43 AM   HGBA1C 7.2 (H) 05/16/2019 10:14 AM    Checking BG: Never  A1c goal < 7% Patient has failed these meds in past: none reported Patient is  currently controlled on the following medications:   No pharmacotherapy  Exercise: unable to do much due to pain Diet: tries to maintain balanced, healthy diet, limit sweets  Plan: Continue control with diet and exercise   GERD   Patient has failed these meds in past: omeprazole  Patient is currently controlled on the following medications:   Esomeprazole 40 mg - 1 tablet daily before breakfast   Symptoms: Reports lump in throat has improved since change to esomeprazole from omeprazole 12/08/19. History of some regurgitation - reports she was on omeprazole for 20 years. Has also reduced snacking at bedtime.   Plan: Continue current medications   Osteoporosis   Last DEXA Scan: 12/2018 - The BMD measured at Femur Neck Right is 0.682 g/cm2 with a T-score of -2.6.   VITD  Date Value Ref Range Status  09/07/2019 46.56 30.00 - 100.00 ng/mL Final    Patient has failed these meds in past: none reported Patient is currently on the following medications:  . Forteo 600 mg/2.4 mL -  Inject 20 mcg daily  . Calcium-vitamin D 650 mg/500 IU (Vi-Activ caramel chews) - 1 twice daily   We discussed: denies adverse effects, self-injects Forteo daily, followed by endocrinology   Plan: Continue current medications  Ulcerative Colitis   Patient has failed these meds in past: none reported Patient is currently controlled on the following medications:   Mesalamine 1000 mg suppository - 1 per rectum PRN  Nature's Bounty Probiotic - 1 daily   We discussed: denies recent use, only uses in emergency situation  Plan: Continue current medications  Osteoarthritis/DDD   Kidney Function Lab Results  Component Value Date/Time   CREATININE 0.92 11/20/2019 09:43 AM   CREATININE 0.99 09/07/2019 11:30 AM   GFR 60.20 11/20/2019 09:43 AM   GFRNONAA >60 03/22/2019 09:20 AM   GFRAA >60 03/22/2019 09:20 AM   K 4.0 11/20/2019 09:43 AM   K 4.3 09/07/2019 11:30 AM   Pt reports scoliosis - sees spine doctor about 4 times a year  Patient has tried these meds in past: physical therapy Patient is currently controlled on the following medications:   Meloxicam 15 mg - 1 tablet daily  Gabapentin 300 mg - 1 BID  Diclofenac gel 1% - apply PRN   TENS unit - uses occasionally  Lidocaine patches - uses occasionally   We discussed: Reports she gets steroid injections 3-4 times per year. Takes 1 gabapentin at 4:30 PM and 1 at bedtime and reports benefit with back pain. Reports drowsiness with gabapentin TID. Takes 1 meloxicam every morning for arthritis pain in hands, neck, and back. Applies diclofenac to back if going to exercise/walk. Reports pain is 'livable' but still experiences pain daily with scale 6/10 most days. Feels least pain when resting in chair. Reports tried to cut back on meloxicam and noticed worsening in arthritis. Discussed Tylenol benefits in arthritis and recommended trying scheduled Tylenol. Overall she states pain does not limit her activity.   Plan: Continue current  medications; May try scheduling Tylenol TID.  Misc. Medications  :  Patient is currently on the following OTC medications:   NeoCell Super Collagen C - takes 3 daily for hair, skin, nails  [facility] Brand Multivitamin - 1 daily  Cetrizine 10 mg - 1 tablet daily  Flonase - 1 spray in each nostril every evening for nasal congestion  Nature's Bounty Cranberry pill 4200 mg with vitamin C/E - takes most days to prevent UTI  Potassium gluconate - 1 capsule  daily (only takes if she doesn't have a banana, takes to prevent muscle cramps)  We discussed: Denies taking Biotin. Reports cataract surgery earlier this year and was on eye drops short term afterwards. Report switching from Claritin back to cetirizine recently due to worsening of under skin itching. This has resolved since she resumed Zyrtec.  Plan: Continue current medications  Medication Management  [facility]   Adherence: fills pillbox every 2 weeks, has an AM and PM container, denies missed doses   Social support: lives with her daughter   Affordability: Denies any medication cost concerns   CCM Follow Up: 6 months, telephone (08/22/20 at 4 AM)  [name], PharmD Clinical Pharmacist [facility] at [facility] [phone]

## 2020-02-24 NOTE — Patient Instructions (Signed)
Dear Brittney Pierce,  It was a pleasure meeting you during our initial appointment on February 20, 2020. Below is a summary of the goals we discussed and components of chronic care management. Please contact me anytime with questions or concerns.   Visit Information  Goals Addressed            This Visit's Progress   . Pharmacy Care Plan       CARE PLAN ENTRY  Current Barriers:  . Chronic Disease Management support, education, and care coordination needs related to Osteoarthritis and Pre-Diabetes  Pre-Diabetes Lab Results  Component Value Date/Time   HGBA1C 6.7 (H) 11/20/2019 09:43 AM   HGBA1C 7.2 (H) 05/16/2019 10:14 AM .  Pharmacist Clinical Goal(s): o Over the next 6 months, patient will work with PharmD and providers to maintain A1c goal <7% . Current regimen:  o No pharmacotherapy . Interventions: o Recommend lifestyle management with heart healthy diet, low in carbohydrates.  . Patient self care activities - Over the next 6 months, patient will: . Incorporate a healthy diet high in vegetables, fruits and whole grains with low-fat dairy products, chicken, fish, legumes, non-tropical vegetable oils and nuts. Limit intake of sweets, sugar-sweetened beverages and red meats.   Chronic Pain/Osteoarthritis  . Pharmacist Clinical Goal(s) o Over the next 6 month, patient will work with PharmD and providers to improve to pain scale. . Current regimen:   Meloxicam 15 mg - 1 tablet daily  Gabapentin 300 mg - 1 at 4:30 PM and 1 at bedtime  Diclofenac gel 1% - apply up to four times daily as needed  TENS unit - uses occasionally  Lidocaine patches - uses occasionally . Interventions: o Discussed benefits of scheduling Tylenol throughout the day for arthritis pain . Patient self care activities - Over the next 6 months, patient will: o May consider trying Tylenol Arthritis 650 mg every 8 hours   Initial goal documentation      [name] was given information  about Chronic Care Management services today including:  1. CCM service includes personalized support from designated clinical staff supervised by her physician, including individualized plan of care and coordination with other care providers 2. 24/7 contact phone numbers for assistance for urgent and routine care needs. 3. Standard insurance, coinsurance, copays and deductibles apply for chronic care management only during months in which we provide at least 20 minutes of these services. Most insurances cover these services at 100%, however patients may be responsible for any copay, coinsurance and/or deductible if applicable. This service may help you avoid the need for more expensive face-to-face services. 4. Only one practitioner may furnish and bill the service in a calendar month. 5. The patient may stop CCM services at any time (effective at the end of the month) by phone call to the office staff.  Patient agreed to services and verbal consent obtained.   The patient verbalized understanding of instructions provided today and agreed to receive a mailed copy of patient instruction and/or educational materials. Telephone follow up appointment with pharmacy team member scheduled for: August 22, 2020 at 11 AM (telephone visit)  [name], PharmD Clinical Pharmacist [facility]   Carbohydrate Counting for Diabetes Mellitus, Adult  Carbohydrate counting is a method of keeping track of how many carbohydrates you eat. Eating carbohydrates naturally increases the amount of sugar (glucose) in the blood. Counting how many carbohydrates you eat helps keep your blood glucose within normal limits, which helps you  manage your diabetes (diabetes mellitus). It is important to know how many carbohydrates you can safely have in each meal. This is different for every person. A diet and nutrition specialist (registered dietitian) can help you make a meal plan and  calculate how many carbohydrates you should have at each meal and snack. Carbohydrates are found in the following foods:  Grains, such as breads and cereals.  Dried beans and soy products.  Starchy vegetables, such as potatoes, peas, and corn.  Fruit and fruit juices.  Milk and yogurt.  Sweets and snack foods, such as cake, cookies, candy, chips, and soft drinks. How do I count carbohydrates? There are two ways to count carbohydrates in food. You can use either of the methods or a combination of both. Reading "Nutrition Facts" on packaged food The "Nutrition Facts" list is included on the labels of almost all packaged foods and beverages in the [place] It includes:  The serving size.  Information about nutrients in each serving, including the grams (g) of carbohydrate per serving. To use the "Nutrition Facts":  Decide how many servings you will have.  Multiply the number of servings by the number of carbohydrates per serving.  The resulting number is the total amount of carbohydrates that you will be having. Learning standard serving sizes of other foods When you eat carbohydrate foods that are not packaged or do not include "Nutrition Facts" on the label, you need to measure the servings in order to count the amount of carbohydrates:  Measure the foods that you will eat with a food scale or measuring cup, if needed.  Decide how many standard-size servings you will eat.  Multiply the number of servings by 15. Most carbohydrate-rich foods have about 15 g of carbohydrates per serving. ? For example, if you eat 8 oz (170 g) of strawberries, you will have eaten 2 servings and 30 g of carbohydrates (2 servings x 15 g = 30 g).  For foods that have more than one food mixed, such as soups and casseroles, you must count the carbohydrates in each food that is included. The following list contains standard serving sizes of common carbohydrate-rich foods. Each of these servings has about 15  g of carbohydrates:   hamburger bun or  English muffin.   oz (15 mL) syrup.   oz (14 g) jelly.  1 slice of bread.  1 six-inch tortilla.  3 oz (85 g) cooked rice or pasta.  4 oz (113 g) cooked dried beans.  4 oz (113 g) starchy vegetable, such as peas, corn, or potatoes.  4 oz (113 g) hot cereal.  4 oz (113 g) mashed potatoes or  of a large baked potato.  4 oz (113 g) canned or frozen fruit.  4 oz (120 mL) fruit juice.  4-6 crackers.  6 chicken nuggets.  6 oz (170 g) unsweetened dry cereal.  6 oz (170 g) plain fat-free yogurt or yogurt sweetened with artificial sweeteners.  8 oz (240 mL) milk.  8 oz (170 g) fresh fruit or one small piece of fruit.  24 oz (680 g) popped popcorn. Example of carbohydrate counting Sample meal  3 oz (85 g) chicken breast.  6 oz (170 g) brown rice.  4 oz (113 g) corn.  8 oz (240 mL) milk.  8 oz (170 g) strawberries with sugar-free whipped topping. Carbohydrate calculation 1. Identify the foods that contain carbohydrates: ? Rice. ? Corn. ? Milk. ? Strawberries. 2. Calculate how many servings you have of  each food: ? 2 servings rice. ? 1 serving corn. ? 1 serving milk. ? 1 serving strawberries. 3. Multiply each number of servings by 15 g: ? 2 servings rice x 15 g = 30 g. ? 1 serving corn x 15 g = 15 g. ? 1 serving milk x 15 g = 15 g. ? 1 serving strawberries x 15 g = 15 g. 4. Add together all of the amounts to find the total grams of carbohydrates eaten: ? 30 g + 15 g + 15 g + 15 g = 75 g of carbohydrates total. Summary  Carbohydrate counting is a method of keeping track of how many carbohydrates you eat.  Eating carbohydrates naturally increases the amount of sugar (glucose) in the blood.  Counting how many carbohydrates you eat helps keep your blood glucose within normal limits, which helps you manage your diabetes.  A diet and nutrition specialist (registered dietitian) can help you make a meal plan and  calculate how many carbohydrates you should have at each meal and snack. This information is not intended to replace advice given to you by your health care provider. Make sure you discuss any questions you have with your health care provider. Document Revised: 02/04/2017 Document Reviewed: 12/25/2015 Elsevier Patient Education  [place].

## 2020-02-28 DIAGNOSIS — M5136 Other intervertebral disc degeneration, lumbar region: Secondary | ICD-10-CM | POA: Diagnosis not present

## 2020-02-28 DIAGNOSIS — Z6831 Body mass index (BMI) 31.0-31.9, adult: Secondary | ICD-10-CM | POA: Diagnosis not present

## 2020-02-28 DIAGNOSIS — M419 Scoliosis, unspecified: Secondary | ICD-10-CM | POA: Diagnosis not present

## 2020-04-05 DIAGNOSIS — R69 Illness, unspecified: Secondary | ICD-10-CM | POA: Diagnosis not present

## 2020-04-21 ENCOUNTER — Other Ambulatory Visit: Payer: Self-pay | Admitting: Family Medicine

## 2020-04-21 DIAGNOSIS — G8929 Other chronic pain: Secondary | ICD-10-CM

## 2020-04-22 NOTE — Telephone Encounter (Signed)
Last office visit 12/19/2019 with Gentry Fitz for dysuria.  Last refilled 01/15/2020 for #90 with no refills. CPE scheduled for 06/05/2020.

## 2020-05-13 DIAGNOSIS — M5136 Other intervertebral disc degeneration, lumbar region: Secondary | ICD-10-CM | POA: Diagnosis not present

## 2020-05-26 ENCOUNTER — Other Ambulatory Visit: Payer: Self-pay | Admitting: Family Medicine

## 2020-05-26 DIAGNOSIS — I10 Essential (primary) hypertension: Secondary | ICD-10-CM

## 2020-05-26 DIAGNOSIS — R7303 Prediabetes: Secondary | ICD-10-CM

## 2020-05-26 DIAGNOSIS — R739 Hyperglycemia, unspecified: Secondary | ICD-10-CM

## 2020-05-26 DIAGNOSIS — R7989 Other specified abnormal findings of blood chemistry: Secondary | ICD-10-CM

## 2020-05-27 DIAGNOSIS — M419 Scoliosis, unspecified: Secondary | ICD-10-CM | POA: Diagnosis not present

## 2020-05-27 DIAGNOSIS — M5136 Other intervertebral disc degeneration, lumbar region: Secondary | ICD-10-CM | POA: Diagnosis not present

## 2020-05-29 ENCOUNTER — Other Ambulatory Visit (INDEPENDENT_AMBULATORY_CARE_PROVIDER_SITE_OTHER): Payer: Medicare HMO

## 2020-05-29 ENCOUNTER — Ambulatory Visit (INDEPENDENT_AMBULATORY_CARE_PROVIDER_SITE_OTHER): Payer: Medicare HMO

## 2020-05-29 ENCOUNTER — Other Ambulatory Visit: Payer: Self-pay

## 2020-05-29 DIAGNOSIS — Z0000 Encounter for general adult medical examination without abnormal findings: Secondary | ICD-10-CM

## 2020-05-29 DIAGNOSIS — R7303 Prediabetes: Secondary | ICD-10-CM | POA: Diagnosis not present

## 2020-05-29 DIAGNOSIS — I10 Essential (primary) hypertension: Secondary | ICD-10-CM

## 2020-05-29 DIAGNOSIS — R739 Hyperglycemia, unspecified: Secondary | ICD-10-CM

## 2020-05-29 DIAGNOSIS — R7989 Other specified abnormal findings of blood chemistry: Secondary | ICD-10-CM | POA: Diagnosis not present

## 2020-05-29 LAB — COMPREHENSIVE METABOLIC PANEL
ALT: 15 U/L (ref 0–35)
AST: 22 U/L (ref 0–37)
Albumin: 3.9 g/dL (ref 3.5–5.2)
Alkaline Phosphatase: 109 U/L (ref 39–117)
BUN: 18 mg/dL (ref 6–23)
CO2: 29 mEq/L (ref 19–32)
Calcium: 9.6 mg/dL (ref 8.4–10.5)
Chloride: 102 mEq/L (ref 96–112)
Creatinine, Ser: 0.88 mg/dL (ref 0.40–1.20)
GFR: 66.14 mL/min (ref >60.00)
Glucose, Bld: 136 mg/dL — ABNORMAL HIGH (ref 70–99)
Potassium: 3.9 mEq/L (ref 3.5–5.1)
Sodium: 139 mEq/L (ref 135–145)
Total Bilirubin: 0.5 mg/dL (ref 0.2–1.2)
Total Protein: 6.3 g/dL (ref 6.0–8.3)

## 2020-05-29 LAB — CBC WITH DIFFERENTIAL/PLATELET
Basophils Absolute: 0.1 10*3/uL (ref 0.0–0.1)
Basophils Relative: 1.3 % (ref 0.0–3.0)
Eosinophils Absolute: 0.3 10*3/uL (ref 0.0–0.7)
Eosinophils Relative: 2.7 % (ref 0.0–5.0)
HCT: 39.4 % (ref 36.0–46.0)
Hemoglobin: 13.1 g/dL (ref 12.0–15.0)
Lymphocytes Relative: 31.3 % (ref 12.0–46.0)
Lymphs Abs: 3 10*3/uL (ref 0.7–4.0)
MCHC: 33.2 g/dL (ref 30.0–36.0)
MCV: 86.9 fl (ref 78.0–100.0)
Monocytes Absolute: 0.6 10*3/uL (ref 0.1–1.0)
Monocytes Relative: 6.7 % (ref 3.0–12.0)
Neutro Abs: 5.5 10*3/uL (ref 1.4–7.7)
Neutrophils Relative %: 58 % (ref 43.0–77.0)
Platelets: 249 10*3/uL (ref 150.0–400.0)
RBC: 4.53 Mil/uL (ref 3.87–5.11)
RDW: 13.8 % (ref 11.5–15.5)
WBC: 9.5 10*3/uL (ref 4.0–10.5)

## 2020-05-29 LAB — HEMOGLOBIN A1C: Hgb A1c MFr Bld: 7 % — ABNORMAL HIGH (ref 4.6–6.5)

## 2020-05-29 LAB — LIPID PANEL
Cholesterol: 168 mg/dL (ref 0–200)
HDL: 49.5 mg/dL (ref >39.00)
LDL Cholesterol: 90 mg/dL (ref 0–99)
NonHDL: 118.13
Total CHOL/HDL Ratio: 3
Triglycerides: 142 mg/dL (ref 0.0–149.0)
VLDL: 28.4 mg/dL (ref 0.0–40.0)

## 2020-05-29 NOTE — Patient Instructions (Signed)
Brittney Pierce , Thank you for taking time to come for your Medicare Wellness Visit. I appreciate your ongoing commitment to your health goals. Please review the following plan we discussed and let me know if I can assist you in the future.   Screening recommendations/referrals: Colonoscopy: Up to date, completed 05/22/20, due 04/2023, you stated that insurance company will send results to provider Mammogram: Up to date, completed 12/28/2018, due 12/2020, you complete this every 2 years  Bone Density: Up to date, completed 12/28/2018, due 12/2020 Recommended yearly ophthalmology/optometry visit for glaucoma screening and checkup Recommended yearly dental visit for hygiene and checkup  Vaccinations: Influenza vaccine: Up to date, completed 04/24/2020, due 02/2021 Pneumococcal vaccine: Completed series Tdap vaccine: Up to date, completed 02/21/2020, due 01/2030 Shingles vaccine: due, check with your insurance regarding coverage if interested   Covid-19:Completed series  Advanced directives: Please bring a copy of your POA (Power of [place]) and/or Living Will to your next appointment.   Conditions/risks identified: hypertension  Next appointment: Follow up in one year for your annual wellness visit    Preventive Care 41 Years and Older, Female Preventive care refers to lifestyle choices and visits with your health care provider that can promote health and wellness. What does preventive care include?  A yearly physical exam. This is also called an annual well check.  Dental exams once or twice a year.  Routine eye exams. Ask your health care provider how often you should have your eyes checked.  Personal lifestyle choices, including:  Daily care of your teeth and gums.  Regular physical activity.  Eating a healthy diet.  Avoiding tobacco and drug use.  Limiting alcohol use.  Practicing safe sex.  Taking low-dose aspirin every day.  Taking vitamin and mineral supplements as  recommended by your health care provider. What happens during an annual well check? The services and screenings done by your health care provider during your annual well check will depend on your age, overall health, lifestyle risk factors, and family history of disease. Counseling  Your health care provider may ask you questions about your:  Alcohol use.  Tobacco use.  Drug use.  Emotional well-being.  Home and relationship well-being.  Sexual activity.  Eating habits.  History of falls.  Memory and ability to understand (cognition).  Work and work Statistician.  Reproductive health. Screening  You may have the following tests or measurements:  Height, weight, and BMI.  Blood pressure.  Lipid and cholesterol levels. These may be checked every 5 years, or more frequently if you are over 63 years old.  Skin check.  Lung cancer screening. You may have this screening every year starting at age 48 if you have a 30-pack-year history of smoking and currently smoke or have quit within the past 15 years.  Fecal occult blood test (FOBT) of the stool. You may have this test every year starting at age 22.  Flexible sigmoidoscopy or colonoscopy. You may have a sigmoidoscopy every 5 years or a colonoscopy every 10 years starting at age 83.  Hepatitis C blood test.  Hepatitis B blood test.  Sexually transmitted disease (STD) testing.  Diabetes screening. This is done by checking your blood sugar (glucose) after you have not eaten for a while (fasting). You may have this done every 1-3 years.  Bone density scan. This is done to screen for osteoporosis. You may have this done starting at age 47.  Mammogram. This may be done every 1-2 years. Talk to your health  care provider about how often you should have regular mammograms. Talk with your health care provider about your test results, treatment options, and if necessary, the need for more tests. Vaccines  Your health care  provider may recommend certain vaccines, such as:  Influenza vaccine. This is recommended every year.  Tetanus, diphtheria, and acellular pertussis (Tdap, Td) vaccine. You may need a Td booster every 10 years.  Zoster vaccine. You may need this after age 61.  Pneumococcal 13-valent conjugate (PCV13) vaccine. One dose is recommended after age 81.  Pneumococcal polysaccharide (PPSV23) vaccine. One dose is recommended after age 32. Talk to your health care provider about which screenings and vaccines you need and how often you need them. This information is not intended to replace advice given to you by your health care provider. Make sure you discuss any questions you have with your health care provider. Document Released: 08/09/2015 Document Revised: 04/01/2016 Document Reviewed: 05/14/2015 Elsevier Interactive Patient Education  2017 [place] Prevention in the Home Falls can cause injuries. They can happen to people of all ages. There are many things you can do to make your home safe and to help prevent falls. What can I do on the outside of my home?  Regularly fix the edges of walkways and driveways and fix any cracks.  Remove anything that might make you trip as you walk through a door, such as a raised step or threshold.  Trim any bushes or trees on the path to your home.  Use bright outdoor lighting.  Clear any walking paths of anything that might make someone trip, such as rocks or tools.  Regularly check to see if handrails are loose or broken. Make sure that both sides of any steps have handrails.  Any raised decks and porches should have guardrails on the edges.  Have any leaves, snow, or ice cleared regularly.  Use sand or salt on walking paths during winter.  Clean up any spills in your garage right away. This includes oil or grease spills. What can I do in the bathroom?  Use night lights.  Install grab bars by the toilet and in the tub and shower. Do  not use towel bars as grab bars.  Use non-skid mats or decals in the tub or shower.  If you need to sit down in the shower, use a plastic, non-slip stool.  Keep the floor dry. Clean up any water that spills on the floor as soon as it happens.  Remove soap buildup in the tub or shower regularly.  Attach bath mats securely with double-sided non-slip rug tape.  Do not have throw rugs and other things on the floor that can make you trip. What can I do in the bedroom?  Use night lights.  Make sure that you have a light by your bed that is easy to reach.  Do not use any sheets or blankets that are too big for your bed. They should not hang down onto the floor.  Have a firm chair that has side arms. You can use this for support while you get dressed.  Do not have throw rugs and other things on the floor that can make you trip. What can I do in the kitchen?  Clean up any spills right away.  Avoid walking on wet floors.  Keep items that you use a lot in easy-to-reach places.  If you need to reach something above you, use a strong step stool that has a grab  bar.  Keep electrical cords out of the way.  Do not use floor polish or wax that makes floors slippery. If you must use wax, use non-skid floor wax.  Do not have throw rugs and other things on the floor that can make you trip. What can I do with my stairs?  Do not leave any items on the stairs.  Make sure that there are handrails on both sides of the stairs and use them. Fix handrails that are broken or loose. Make sure that handrails are as long as the stairways.  Check any carpeting to make sure that it is firmly attached to the stairs. Fix any carpet that is loose or worn.  Avoid having throw rugs at the top or bottom of the stairs. If you do have throw rugs, attach them to the floor with carpet tape.  Make sure that you have a light switch at the top of the stairs and the bottom of the stairs. If you do not have them,  ask someone to add them for you. What else can I do to help prevent falls?  Wear shoes that:  Do not have high heels.  Have rubber bottoms.  Are comfortable and fit you well.  Are closed at the toe. Do not wear sandals.  If you use a stepladder:  Make sure that it is fully opened. Do not climb a closed stepladder.  Make sure that both sides of the stepladder are locked into place.  Ask someone to hold it for you, if possible.  Clearly mark and make sure that you can see:  Any grab bars or handrails.  First and last steps.  Where the edge of each step is.  Use tools that help you move around (mobility aids) if they are needed. These include:  Canes.  Walkers.  Scooters.  Crutches.  Turn on the lights when you go into a dark area. Replace any light bulbs as soon as they burn out.  Set up your furniture so you have a clear path. Avoid moving your furniture around.  If any of your floors are uneven, fix them.  If there are any pets around you, be aware of where they are.  Review your medicines with your doctor. Some medicines can make you feel dizzy. This can increase your chance of falling. Ask your doctor what other things that you can do to help prevent falls. This information is not intended to replace advice given to you by your health care provider. Make sure you discuss any questions you have with your health care provider. Document Released: 05/09/2009 Document Revised: 12/19/2015 Document Reviewed: 08/17/2014 Elsevier Interactive Patient Education  2017 [employer].

## 2020-05-29 NOTE — Progress Notes (Signed)
PCP notes:  Health Maintenance: Shingrix- due, wants to get this at physical if insurance covers   Abnormal Screenings: none   Patient concerns: none   Nurse concerns: none   Next PCP appt: 06/05/2020 @ 11:30 am

## 2020-05-29 NOTE — Progress Notes (Signed)
Subjective:   Brittney Pierce is a 70 y.o. female who presents for Medicare Annual (Subsequent) preventive examination.  Review of Systems: N/A      I connected with the patient today by telephone and verified that I am speaking with the correct person using two identifiers. Location patient: home Location nurse: work Persons participating in the telephone visit: patient, nurse.   I discussed the limitations, risks, security and privacy concerns of performing an evaluation and management service by telephone and the availability of in person appointments. I also discussed with the patient that there may be a patient responsible charge related to this service. The patient expressed understanding and verbally consented to this telephonic visit.        Cardiac Risk Factors include: advanced age (>28mn, >>79women);hypertension     Objective:    Today's Vitals   05/29/20 1106  PainSc: 7    There is no height or weight on file to calculate BMI.  Advanced Directives 05/29/2020 03/22/2019 03/17/2019 05/10/2018 05/04/2017  Does Patient Have a Medical Advance Directive? Yes No No Yes Yes  Type of [place]amed[place] will - - [place]  Copy of [place] Chart? No - copy requested - - No - copy requested No - copy requested  Would patient like information on creating a medical advance directive? - No - Patient declined - - -    Current Medications (verified) Outpatient Encounter Medications as of 05/29/2020  Medication Sig  . Calcium 1200-1000 MG-UNIT CHEW Chew 1 tablet by mouth daily.    . cetirizine (ZYRTEC) 10 MG tablet Take 10 mg by mouth daily.    . COLLAGEN PO Take 3 tablets by mouth daily. NeoCell Super Collagen C - takes 3 daily for hair, skin, nails  . Cranberry-Vitamin C-Vitamin E 4200-20-3 MG-MG-UNIT CAPS Take by mouth. 1 daily  . diclofenac  sodium (VOLTAREN) 1 % GEL Apply topically as needed.   .Marland Kitchenesomeprazole (NEXIUM) 40 MG capsule Take 1 capsule (40 mg total) by mouth daily. Take at least 1 hour before a meal.  . fluticasone (FLONASE) 50 MCG/ACT nasal spray 2 sprays each nostril as directed.  . gabapentin (NEURONTIN) 300 MG capsule Take 300 mg by mouth 2 (two) times daily.   . meloxicam (MOBIC) 15 MG tablet TAKE 1 TABLET DAILY  . mesalamine (CANASA) 1000 MG suppository PLACE 1 SUPPOSITORY (1,000 MG TOTAL) RECTALLY AT BEDTIME AS NEEDED.  . metoprolol tartrate (LOPRESSOR) 25 MG tablet TAKE 1/2 TABLET TWICE A DAY  . Multiple Vitamins-Minerals (MULTIVITAMIN PO) Take 1 tablet by mouth daily.  .Marland Kitchenomeprazole (PRILOSEC) 40 MG capsule TAKE 1 CAPSULE DAILY  . POTASSIUM GLUCONATE PO Take 1 capsule by mouth daily.  . Probiotic Product (PROBIOTIC DAILY PO) Take by mouth.  . Teriparatide, Recombinant, (FORTEO) 600 MCG/2.4ML SOPN Inject 20 mcg into the skin daily.  .Marland KitchenBIOTIN PO Take 1 capsule by mouth daily. (Patient not taking: Reported on 02/20/2020)  . ciprofloxacin (CIPRO) 250 MG tablet Take 1 tablet (250 mg total) by mouth 2 (two) times daily. (Patient not taking: Reported on 02/20/2020)   No facility-administered encounter medications on file as of 05/29/2020.    Allergies (verified) Amoxicillin, Cephalexin, Prednisone, Bactrim ds [sulfamethoxazole-trimethoprim], Codeine, Doxycycline, Erythromycin, and Sulfamethizole   History: Past Medical History:  Diagnosis Date  . Disorder of bone and cartilage, unspecified   . Dysuria   . Esophageal reflux   .  Lumbago   . Osteoarthrosis, unspecified whether generalized or localized, unspecified site   . Ulcerative colitis, unspecified   . Unspecified essential hypertension    Past Surgical History:  Procedure Laterality Date  . CATARACT EXTRACTION, BILATERAL     08/2019 and 09/2019  . CESAREAN SECTION     x2  . CHOLECYSTECTOMY    . [place] SURGERY  2008   Family History  Problem  Relation Age of Onset  . Heart attack Father 5  . Diabetes Father   . Lung cancer Mother    Social History   Socioeconomic History  . Marital status: Widowed    Spouse name: Not on file  . Number of children: Not on file  . Years of education: Not on file  . Highest education level: Not on file  Occupational History  . Occupation: Retired  Tobacco Use  . Smoking status: Former Smoker    Quit date: 07/27/1980    Years since quitting: 39.8  . Smokeless tobacco: Never Used  Vaping Use  . Vaping Use: Never used  Substance and Sexual Activity  . Alcohol use: Yes    Comment: Once a month (maybe)  . Drug use: No  . Sexual activity: Not Currently  Other Topics Concern  . Not on file  Social History Narrative   Regular exercise-yes      Moved here from [facility] to be closer to grandchildren      Likes to garden      Does have a living will.  Husband is HPOA.   Would desire life support, no prolonged life support if futile.            Social Determinants of Health   Financial Resource Strain: Low Risk   . Difficulty of Paying Living Expenses: Not hard at all  Food Insecurity: No Food Insecurity  . Worried About Charity fundraiser in the Last Year: Never true  . Ran Out of Food in the Last Year: Never true  Transportation Needs: No Transportation Needs  . Lack of Transportation (Medical): No  . Lack of Transportation (Non-Medical): No  Physical Activity: Inactive  . Days of Exercise per Week: 0 days  . Minutes of Exercise per Session: 0 min  Stress: No Stress Concern Present  . Feeling of Stress : Not at all  Social Connections:   . Frequency of Communication with Friends and Family: Not on file  . Frequency of Social Gatherings with Friends and Family: Not on file  . Attends Religious Services: Not on file  . Active Member of Clubs or Organizations: Not on file  . Attends Archivist Meetings: Not on file  . Marital Status: Not on file    Tobacco  Counseling Counseling given: Not Answered   Clinical Intake:  Pre-visit preparation completed: Yes  Pain : 0-10 Pain Score: 7  Pain Type: Chronic pain Pain Location: Back Pain Orientation: Lower Pain Descriptors / Indicators: Aching Pain Onset: More than a month ago Pain Frequency: Intermittent     Nutritional Risks: None Diabetes: No  How often do you need to have someone help you when you read instructions, pamphlets, or other written materials from your doctor or pharmacy?: 1 - Never What is the last grade level you completed in school?: some college  Diabetic: No Nutrition Risk Assessment:  Has the patient had any N/V/D within the last 2 months?  No  Does the patient have any non-healing wounds?  No  Has the  patient had any unintentional weight loss or weight gain?  No   Diabetes:  Is the patient diabetic?  No  If diabetic, was a CBG obtained today?  N/A Did the patient bring in their glucometer from home?  N/A How often do you monitor your CBG's? N/A.   Financial Strains and Diabetes Management:  Are you having any financial strains with the device, your supplies or your medication? N/A.  Does the patient want to be seen by Chronic Care Management for management of their diabetes?  N/A Would the patient like to be referred to a Nutritionist or for Diabetic Management?  N/A   Interpreter Needed?: No  Information entered by :: CJohnson, LPN   Activities of Daily Living In your present state of health, do you have any difficulty performing the following activities: 05/29/2020  Hearing? N  Vision? N  Difficulty concentrating or making decisions? N  Walking or climbing stairs? N  Dressing or bathing? N  Doing errands, shopping? N  Preparing Food and eating ? N  Using the Toilet? N  In the past six months, have you accidently leaked urine? N  Do you have problems with loss of bowel control? N  Managing your Medications? N  Managing your Finances? N   Housekeeping or managing your Housekeeping? N  Some recent data might be hidden    Patient Care Team: Elby Beck, FNP as PCP - General (Nurse Practitioner) Loren Racer, PA-C (Inactive) as Consulting Physician (Gastroenterology) Jannet Mantis, MD as Consulting Physician (Dermatology) Roosvelt Harps., OD as Consulting Physician (Optometry) [name], [facility] as Pharmacist (Pharmacist)  Indicate any recent Medical Services you may have received from other than [facility] providers in the past year (date may be approximate).     Assessment:   This is a routine wellness examination for [place].  Hearing/Vision screen  Hearing Screening   125Hz  250Hz  500Hz  1000Hz  2000Hz  3000Hz  4000Hz  6000Hz  8000Hz   Right ear:           Left ear:           Vision Screening Comments: Patient gets annual eye exams.   Dietary issues and exercise activities discussed: Current Exercise Habits: The patient does not participate in regular exercise at present, Exercise limited by: orthopedic condition(s) (back issues)  Goals    . Increase physical activity     Starting 05/10/2018, I will continue to exercise for at least 60 minutes 3 days per week.     . Patient Stated     05/29/2020, I will maintain and continue medications as prescribed.     . Pharmacy Care Plan     CARE PLAN ENTRY  Current Barriers:  . Chronic Disease Management support, education, and care coordination needs related to Osteoarthritis and Pre-Diabetes  Pre-Diabetes Lab Results  Component Value Date/Time   HGBA1C 6.7 (H) 11/20/2019 09:43 AM   HGBA1C 7.2 (H) 05/16/2019 10:14 AM .  Pharmacist Clinical Goal(s): o Over the next 6 months, patient will work with PharmD and providers to maintain A1c goal <7% . Current regimen:  o No pharmacotherapy . Interventions: o Recommend lifestyle management with heart healthy diet, low in carbohydrates.  . Patient self care activities - Over the next 6 months, patient  will: . Incorporate a healthy diet high in vegetables, fruits and whole grains with low-fat dairy products, chicken, fish, legumes, non-tropical vegetable oils and nuts. Limit intake of sweets, sugar-sweetened beverages and red meats.   Chronic Pain/Osteoarthritis  . Pharmacist Clinical Goal(s)  o Over the next 6 month, patient will work with PharmD and providers to improve to pain scale. . Current regimen:   Meloxicam 15 mg - 1 tablet daily  Gabapentin 300 mg - 1 at 4:30 PM and 1 at bedtime  Diclofenac gel 1% - apply up to four times daily as needed  TENS unit - uses occasionally  Lidocaine patches - uses occasionally . Interventions: o Discussed benefits of scheduling Tylenol throughout the day for arthritis pain . Patient self care activities - Over the next 6 months, patient will: o May consider trying Tylenol Arthritis 650 mg every 8 hours   Initial goal documentation      Depression Screen PHQ 2/9 Scores 05/29/2020 05/22/2019 05/10/2018 05/04/2017 04/28/2016 04/28/2016 03/20/2015  PHQ - 2 Score 0 0 0 0 1 1 0  PHQ- 9 Score 0 - 0 0 - - -    Fall Risk Fall Risk  05/29/2020 05/22/2019 05/10/2018 05/04/2017 04/28/2016  Falls in the past year? 0 0 No No No  Number falls in past yr: 0 - - - -  Injury with Fall? 0 - - - -  Risk for fall due to : Medication side effect - - - -  Follow up Falls evaluation completed;Falls prevention discussed - - - -    Any stairs in or around the home? Yes  If so, are there any without handrails? No  Home free of loose throw rugs in walkways, pet beds, electrical cords, etc? Yes  Adequate lighting in your home to reduce risk of falls? Yes   ASSISTIVE DEVICES UTILIZED TO PREVENT FALLS:  Life alert? No  Use of a cane, walker or w/c? No  Grab bars in the bathroom? No  Shower chair or bench in shower? No  Elevated toilet seat or a handicapped toilet? No   TIMED UP AND GO:  Was the test performed? N/A, telephonic visit.   Cognitive  Function: MMSE - Mini Mental State Exam 05/29/2020 05/10/2018 05/04/2017  Orientation to time 5 5 5   Orientation to Place 5 5 5   Registration 3 3 3   Attention/ Calculation 5 0 0  Recall 3 3 3   Language- name 2 objects - 0 0  Language- repeat 1 1 1   Language- follow 3 step command - 3 3  Language- read & follow direction - 0 0  Write a sentence - 0 0  Copy design - 0 0  Total score - 20 20  Mini Cog  Mini-Cog screen was completed. Maximum score is 22. A value of 0 denotes this part of the MMSE was not completed or the patient failed this part of the Mini-Cog screening.       Immunizations Immunization History  Administered Date(s) Administered  . Fluad Quad(high Dose 65+) 06/01/2019, 04/24/2020  . Influenza Split 05/12/2011  . Influenza Whole 04/29/2010  . Influenza, High Dose Seasonal PF 04/30/2017, 04/08/2018  . Influenza,inj,Quad PF,6+ Mos 04/25/2013, 05/07/2014, 03/20/2015  . PFIZER SARS-COV-2 Vaccination 09/17/2019, 10/10/2019, 05/01/2020  . Pneumococcal Conjugate-13 03/20/2015  . Pneumococcal Polysaccharide-23 02/13/2014  . Tdap 02/21/2020  . Zoster 04/07/2011    TDAP status: Up to date Flu Vaccine status: Up to date Pneumococcal vaccine status: Up to date Covid-19 vaccine status: Completed vaccines  Qualifies for Shingles Vaccine? Yes   Zostavax completed Yes   Shingrix Completed?: No.    Education has been provided regarding the importance of this vaccine. Patient has been advised to call insurance company to determine out of pocket expense if  they have not yet received this vaccine. Advised may also receive vaccine at local pharmacy or Health Dept. Verbalized acceptance and understanding.  Screening Tests Health Maintenance  Topic Date Due  . MAMMOGRAM  12/27/2020  . Fecal DNA (Cologuard)  05/23/2023  . INFLUENZA VACCINE  Completed  . DEXA SCAN  Completed  . COVID-19 Vaccine  Completed  . Hepatitis C Screening  Completed  . PNA vac Low Risk Adult  Completed   . TETANUS/TDAP  Discontinued    Health Maintenance  There are no preventive care reminders to display for this patient.  Colorectal cancer screening: Completed cologuard last week through her insurance company per patient. They will send results to the provider.. Repeat every 3 years Mammogram status: Completed 12/28/2018. Repeat every 2 years per patient. Bone Density status: Completed 12/28/2018. Results reflect: Bone density results: OSTEOPENIA. Repeat every 2 years.  Lung Cancer Screening: (Low Dose CT Chest recommended if Age 65-80 years, 30 pack-year currently smoking OR have quit w/in 15 years.) does not qualify.    Additional Screening:  Hepatitis C Screening: does qualify; Completed 04/28/2016  Vision Screening: Recommended annual ophthalmology exams for early detection of glaucoma and other disorders of the eye. Is the patient up to date with their annual eye exam?  Yes  Who is the provider or what is the name of the office in which the patient attends annual eye exams? [facility]  If pt is not established with a provider, would they like to be referred to a provider to establish care? No .   Dental Screening: Recommended annual dental exams for proper oral hygiene  Community Resource Referral / Chronic Care Management: CRR required this visit?  No   CCM required this visit?  No      Plan:     I have personally reviewed and noted the following in the patient's chart:   . Medical and social history . Use of alcohol, tobacco or illicit drugs  . Current medications and supplements . Functional ability and status . Nutritional status . Physical activity . Advanced directives . List of other physicians . Hospitalizations, surgeries, and ER visits in previous 12 months . Vitals . Screenings to include cognitive, depression, and falls . Referrals and appointments  In addition, I have reviewed and discussed with patient certain preventive protocols, quality metrics,  and best practice recommendations. A written personalized care plan for preventive services as well as general preventive health recommendations were provided to patient.   Due to this being a telephonic visit, the after visit summary with patients personalized plan was offered to patient via office or my-chart. Patient preferred to pick up at office at next visit or via mychart.   Andrez Grime, LPN   03/02/[id number]

## 2020-05-30 ENCOUNTER — Encounter: Payer: Self-pay | Admitting: Family Medicine

## 2020-05-30 LAB — FECAL OCCULT BLOOD, IMMUNOCHEMICAL: IFOBT: NEGATIVE

## 2020-06-04 ENCOUNTER — Telehealth: Payer: Self-pay

## 2020-06-04 NOTE — Chronic Care Management (AMB) (Addendum)
Chronic Care Management Pharmacy Assistant    Name: Brittney Pierce  MRN: [id number] DOB: 1950-12-10    Reason for Encounter: Medication Review - General Adherence Call  PCP : Elby Beck, FNP  Allergies:   Allergies  Allergen Reactions   Amoxicillin Diarrhea   Cephalexin    Prednisone Other (See Comments)    Sx's of heart attack - elevated BP, HR and chest tightness/pressure.   Bactrim Ds [Sulfamethoxazole-Trimethoprim] Other (See Comments)    Rash,itching,whelps, throat swelling and feels like lump in throat.   Codeine     REACTION: stomach upset   Doxycycline     GI side effects   Erythromycin     REACTION: stomach upset   Sulfamethizole Hives    Medications: Outpatient Encounter Medications as of 06/04/2020  Medication Sig   BIOTIN PO Take 1 capsule by mouth daily. (Patient not taking: Reported on 02/20/2020)   Calcium 1200-1000 MG-UNIT CHEW Chew 1 tablet by mouth daily.     cetirizine (ZYRTEC) 10 MG tablet Take 10 mg by mouth daily.     ciprofloxacin (CIPRO) 250 MG tablet Take 1 tablet (250 mg total) by mouth 2 (two) times daily. (Patient not taking: Reported on 02/20/2020)   COLLAGEN PO Take 3 tablets by mouth daily. NeoCell Super Collagen C - takes 3 daily for hair, skin, nails   Cranberry-Vitamin C-Vitamin E 4200-20-3 MG-MG-UNIT CAPS Take by mouth. 1 daily   diclofenac sodium (VOLTAREN) 1 % GEL Apply topically as needed.    esomeprazole (NEXIUM) 40 MG capsule Take 1 capsule (40 mg total) by mouth daily. Take at least 1 hour before a meal.   fluticasone (FLONASE) 50 MCG/ACT nasal spray 2 sprays each nostril as directed.   gabapentin (NEURONTIN) 300 MG capsule Take 300 mg by mouth 2 (two) times daily.    meloxicam (MOBIC) 15 MG tablet TAKE 1 TABLET DAILY   mesalamine (CANASA) 1000 MG suppository PLACE 1 SUPPOSITORY (1,000 MG TOTAL) RECTALLY AT BEDTIME AS NEEDED.   metoprolol tartrate (LOPRESSOR) 25 MG tablet TAKE 1/2 TABLET TWICE A DAY   Multiple  Vitamins-Minerals (MULTIVITAMIN PO) Take 1 tablet by mouth daily.   omeprazole (PRILOSEC) 40 MG capsule TAKE 1 CAPSULE DAILY   POTASSIUM GLUCONATE PO Take 1 capsule by mouth daily.   Probiotic Product (PROBIOTIC DAILY PO) Take by mouth.   Teriparatide, Recombinant, (FORTEO) 600 MCG/2.4ML SOPN Inject 20 mcg into the skin daily.   No facility-administered encounter medications on file as of 06/04/2020.    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 Reports her blood pressures are in "good range". Denies any medication issues. Reviewed adherence from dispensing report - refills timely, no gaps > 5 days. Patient states that she has a follow up tomorrow with Clarene Reamer ,FNP    Sharyn Lull Adams,CPP. Notified   Judithann Sheen, [place] Pharmacist Assistant [phone]   I have reviewed the care management and care coordination activities outlined in this encounter and  I am certifying that I agree with the content of this note. No further action required.  [name], PharmD Clinical Pharmacist [facility] [phone]

## 2020-06-05 ENCOUNTER — Ambulatory Visit (INDEPENDENT_AMBULATORY_CARE_PROVIDER_SITE_OTHER): Payer: Medicare HMO | Admitting: Family Medicine

## 2020-06-05 ENCOUNTER — Other Ambulatory Visit: Payer: Self-pay

## 2020-06-05 ENCOUNTER — Encounter: Payer: Self-pay | Admitting: Family Medicine

## 2020-06-05 VITALS — BP 120/68 | HR 72 | Temp 97.4°F | Ht 64.0 in | Wt 186.0 lb

## 2020-06-05 DIAGNOSIS — Z0000 Encounter for general adult medical examination without abnormal findings: Secondary | ICD-10-CM

## 2020-06-05 DIAGNOSIS — G8929 Other chronic pain: Secondary | ICD-10-CM

## 2020-06-05 DIAGNOSIS — M545 Low back pain, unspecified: Secondary | ICD-10-CM | POA: Diagnosis not present

## 2020-06-05 DIAGNOSIS — I10 Essential (primary) hypertension: Secondary | ICD-10-CM

## 2020-06-05 DIAGNOSIS — M810 Age-related osteoporosis without current pathological fracture: Secondary | ICD-10-CM

## 2020-06-05 DIAGNOSIS — Z23 Encounter for immunization: Secondary | ICD-10-CM | POA: Diagnosis not present

## 2020-06-05 DIAGNOSIS — R7303 Prediabetes: Secondary | ICD-10-CM | POA: Diagnosis not present

## 2020-06-05 NOTE — Patient Instructions (Signed)
Good to see you today  Please follow up in 6 months  Increase your protein, decrease your carbs  There is not one right eating plan for everyone.  It may take trial and error to find what will work for you.  It is important to get adequate protein and fiber with your meals.  It is okay to not eat breakfast or to skip meals if you are not hungry.  Avoid snacking between meals.  Unless you are on a fluid restriction, drink 80 to 90 ounces of water a day.  Suggested resources- www.dietdoctor.com/diabetes/diet www.adaptyourlifeacademy.com-there is a quiz to help you determine how many carbohydrates you should eat a day  www.thefastingmethod.com  Here are some guidelines to help you with meal planning -  Avoid all processed and packaged foods (bread, pasta, crackers, chips, etc) and beverages containing calories.  Avoid added sugars and excessive natural sugars.  Pay attention to how you feel if you consume artificial sweeteners.  Do they make you more hungry or raise your blood sugar?  With every meal and snack, aim to get 20 g of protein (3 ounces of meat, 4 ounces of fish, 3 eggs, protein powder, 1 cup [place] yogurt, 1 cup cottage cheese, etc.)  Increase fiber in the form of non-starchy vegetables.  These help you feel full with very little carbohydrates and are good for gut health.  Nonstarchy vegetables include summer squash, onions, peppers, tomatoes, eggplant, broccoli, cauliflower, cabbage, lettuce, spinach.  Have small amounts of good fats such as avocado, nuts, olive oil, nut butters, olives.  Add a little cheese to your meals to make them tasty.   Try to plan your meals for the week and do some meal preparation when able.  If possible, make lunches for the week ahead of time.  Plan a couple of dinners and make enough so you can have leftovers.  Build in a treat once a week.

## 2020-06-05 NOTE — Progress Notes (Signed)
Subjective:    Patient ID: Brittney Pierce, female    DOB: [birth year], 69 y.o.   MRN: [id number]  HPI Chief Complaint  Patient presents with  . Annual Exam    shingrix, 2nd insurance will pay   This is a 69 yo female who presents today for annual exam. She and her daughter moved in together and her daughter had a baby girl last month.  Things have been going well.   Last CPE- 05/22/2019 Mammo- 12/28/2018 Pap- aged out Colonoscopy- had cologuard 05/19/2020 Tdap- 02/21/2020 Flu-annual Covid 19 vaccine- fully vaccinated, + booster Eye- regular Dental- dentures Exercise- limited by her back  Back- can't have any more shots, taking gabapentin with some improvement, right leg burning, better with gabapentin.  Some limitation to her ability to walk for distances.   Review of Systems  Constitutional: Negative.   HENT: Positive for rhinorrhea.   Eyes: Negative.   Respiratory: Negative.   Cardiovascular: Negative.   Endocrine: Negative.   Genitourinary: Negative.   Musculoskeletal: Positive for back pain.  Skin: Negative.   Allergic/Immunologic: Positive for environmental allergies (Zyrtec, Flonase).  Hematological: Negative.   Psychiatric/Behavioral: Negative.        Objective:   Physical Exam Vitals reviewed.  Constitutional:      General: She is not in acute distress.    Appearance: Normal appearance. She is obese. She is not ill-appearing, toxic-appearing or diaphoretic.  HENT:     Head: Normocephalic and atraumatic.     Right Ear: Tympanic membrane, ear canal and external ear normal.     Left Ear: Tympanic membrane, ear canal and external ear normal.     Nose: Nose normal.     Mouth/Throat:     Mouth: Mucous membranes are moist.     Pharynx: Oropharynx is clear.  Eyes:     Conjunctiva/sclera: Conjunctivae normal.  Cardiovascular:     Rate and Rhythm: Normal rate and regular rhythm.     Heart sounds: Normal heart sounds.  Pulmonary:     Effort:  Pulmonary effort is normal.     Breath sounds: Normal breath sounds.  Abdominal:     General: Abdomen is flat. Bowel sounds are normal. There is no distension.     Palpations: Abdomen is soft. There is no mass.     Tenderness: There is no abdominal tenderness. There is no guarding or rebound.     Hernia: No hernia is present.  Musculoskeletal:     Cervical back: Normal range of motion and neck supple. No rigidity or tenderness.     Right lower leg: No edema.     Left lower leg: No edema.  Lymphadenopathy:     Cervical: No cervical adenopathy.  Skin:    General: Skin is warm and dry.  Neurological:     Mental Status: She is alert.          BP 120/68   Pulse 72   Temp (!) 97.4 F (36.3 C) (Temporal)   Ht 5' 4"  (1.626 m)   Wt 186 lb (84.4 kg)   SpO2 98%   BMI 31.93 kg/m  Wt Readings from Last 3 Encounters:  06/05/20 186 lb (84.4 kg)  12/19/19 188 lb (85.3 kg)  11/24/19 187 lb (84.8 kg)   Depression screen [facility] 2/9 05/29/2020 05/22/2019 05/10/2018 05/04/2017 04/28/2016  Decreased Interest 0 0 0 0 0  Down, Depressed, Hopeless 0 0 0 0 1  PHQ - 2 Score 0 0 0 0 1  Altered sleeping  0 - 0 0 -  Tired, decreased energy 0 - 0 0 -  Change in appetite 0 - 0 0 -  Feeling bad or failure about yourself  0 - 0 0 -  Trouble concentrating 0 - 0 0 -  Moving slowly or fidgety/restless 0 - 0 0 -  Suicidal thoughts 0 - 0 0 -  PHQ-9 Score 0 - 0 0 -  Difficult doing work/chores Not difficult at all - Not difficult at all Not difficult at all -     Assessment & Plan:  1. Annual physical exam - Discussed and encouraged healthy lifestyle choices- adequate sleep, regular exercise, stress management and healthy food choices.    2. Need for shingles vaccine - Varicella-zoster vaccine IM (Shingrix)  3. Prediabetes -Blood sugar up a little bit, hemoglobin A1c 7.0, discussed and encouraged dietary modifications and we will recheck in 6 months  4. Essential hypertension -Well-controlled on  low-dose metoprolol  5. Chronic low back pain without sciatica, unspecified back pain laterality -Continue follow-up with orthopedics, encouraged her to increase activity as tolerated  6. Age-related osteoporosis without current pathological fracture -She is currently on Forteo and is followed by endocrine  -Follow-up in 6 months  This visit occurred during the SARS-CoV-2 public health emergency.  Safety protocols were in place, including screening questions prior to the visit, additional usage of staff PPE, and extensive cleaning of exam room while observing appropriate contact time as indicated for disinfecting solutions.    Clarene Reamer, FNP-BC  [facility]  06/06/2020 12:53 PM

## 2020-06-06 ENCOUNTER — Encounter: Payer: Self-pay | Admitting: Family Medicine

## 2020-06-06 DIAGNOSIS — M810 Age-related osteoporosis without current pathological fracture: Secondary | ICD-10-CM | POA: Insufficient documentation

## 2020-06-25 ENCOUNTER — Other Ambulatory Visit: Payer: Self-pay | Admitting: Family Medicine

## 2020-06-25 ENCOUNTER — Encounter: Payer: Self-pay | Admitting: Family Medicine

## 2020-06-25 DIAGNOSIS — K219 Gastro-esophageal reflux disease without esophagitis: Secondary | ICD-10-CM

## 2020-06-25 MED ORDER — ESOMEPRAZOLE MAGNESIUM 40 MG PO CPDR: 40.0000 mg | DELAYED_RELEASE_CAPSULE | Freq: Every day | ORAL | 3 refills | Status: DC

## 2020-06-27 DIAGNOSIS — M5136 Other intervertebral disc degeneration, lumbar region: Secondary | ICD-10-CM | POA: Diagnosis not present

## 2020-06-27 DIAGNOSIS — M419 Scoliosis, unspecified: Secondary | ICD-10-CM | POA: Diagnosis not present

## 2020-07-04 ENCOUNTER — Other Ambulatory Visit: Payer: Self-pay | Admitting: Family Medicine

## 2020-07-04 ENCOUNTER — Encounter: Payer: Self-pay | Admitting: Family Medicine

## 2020-07-04 DIAGNOSIS — K219 Gastro-esophageal reflux disease without esophagitis: Secondary | ICD-10-CM

## 2020-07-04 MED ORDER — ESOMEPRAZOLE MAGNESIUM 40 MG PO CPDR: 40.0000 mg | DELAYED_RELEASE_CAPSULE | Freq: Every day | ORAL | 3 refills | Status: DC

## 2020-07-17 ENCOUNTER — Telehealth: Payer: Self-pay

## 2020-07-17 NOTE — Chronic Care Management (AMB) (Addendum)
Chronic Care Management Pharmacy Assistant   Name: Nasim Garofano  MRN: [id number] DOB: 28-Oct-1950  Reason for Encounter: Disease State  Patient Questions:  1.  Have you seen any other providers since your last visit? Yes 06/05/2020- Clarene Reamer- PCP   2.  Any changes in your medicines or health? Yes Dr. Patrice Paradise changed patient from gabapentin to Lyrica.     PCP : Elby Beck, FNP  Allergies:   Allergies  Allergen Reactions   Amoxicillin Diarrhea   Cephalexin    Prednisone Other (See Comments)    Sx's of heart attack - elevated BP, HR and chest tightness/pressure.   Bactrim Ds [Sulfamethoxazole-Trimethoprim] Other (See Comments)    Rash,itching,whelps, throat swelling and feels like lump in throat.   Codeine     REACTION: stomach upset   Doxycycline     GI side effects   Erythromycin     REACTION: stomach upset   Sulfamethizole Hives    Medications: Outpatient Encounter Medications as of 07/17/2020  Medication Sig   BIOTIN PO Take 1 capsule by mouth daily.    Calcium 1200-1000 MG-UNIT CHEW Chew 1 tablet by mouth daily.     cetirizine (ZYRTEC) 10 MG tablet Take 10 mg by mouth daily.     COLLAGEN PO Take 3 tablets by mouth daily. NeoCell Super Collagen C - takes 3 daily for hair, skin, nails   Cranberry-Vitamin C-Vitamin E 4200-20-3 MG-MG-UNIT CAPS Take by mouth. 1 daily   diclofenac sodium (VOLTAREN) 1 % GEL Apply topically as needed.    esomeprazole (NEXIUM) 40 MG capsule Take 1 capsule (40 mg total) by mouth daily. Take at least 1 hour before a meal.   fluticasone (FLONASE) 50 MCG/ACT nasal spray 2 sprays each nostril as directed.   gabapentin (NEURONTIN) 300 MG capsule Take 300 mg by mouth in the morning, at noon, and at bedtime.    meloxicam (MOBIC) 15 MG tablet TAKE 1 TABLET DAILY   mesalamine (CANASA) 1000 MG suppository PLACE 1 SUPPOSITORY (1,000 MG TOTAL) RECTALLY AT BEDTIME AS NEEDED.   metoprolol tartrate (LOPRESSOR) 25 MG tablet TAKE  1/2 TABLET TWICE A DAY   Multiple Vitamins-Minerals (MULTIVITAMIN PO) Take 1 tablet by mouth daily.   POTASSIUM GLUCONATE PO Take 1 capsule by mouth daily.   Probiotic Product (PROBIOTIC DAILY PO) Take by mouth.   Teriparatide, Recombinant, (FORTEO) 600 MCG/2.4ML SOPN Inject 20 mcg into the skin daily.   No facility-administered encounter medications on file as of 07/17/2020.    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 Wilczynski was contacted for general disease state call. Since last visit with [name], Pharm. D,  Dr. Rennis [place]ing, Orthopedics - switched her from gabapentin to lyrica due to the gabapentin not helping.  The patient has not had an ED visit since their last CPP follow up. The patient is not currently on Chronic and PDC medications.  She currently does not have a greater than 5 day  gap between last fill.The patient has not had any problems with her health or pharmacy. The patient has not had any side effects with her medicines. The patient has no recommendations for improvements in managing care. She was reminded of her phone appointment with [name] 08/22/20 at 11:00am.               [name], CPP notified  Margaretmary Dys, [place] Assistant [phone]   Follow-Up:  Pharmacist Review  I have reviewed the care management and care coordination activities outlined in this encounter. No further action  required.  [name], PharmD Clinical Pharmacist [facility] [phone]

## 2020-07-20 ENCOUNTER — Other Ambulatory Visit: Payer: Self-pay | Admitting: Family Medicine

## 2020-07-20 DIAGNOSIS — G8929 Other chronic pain: Secondary | ICD-10-CM

## 2020-07-22 ENCOUNTER — Other Ambulatory Visit: Payer: Self-pay | Admitting: Internal Medicine

## 2020-07-22 DIAGNOSIS — M810 Age-related osteoporosis without current pathological fracture: Secondary | ICD-10-CM

## 2020-07-22 NOTE — Telephone Encounter (Signed)
Pharmacy requests refill on: Meloxicam 15 mg   LAST REFILL: 04/22/2020 (Q-90, R-0) LAST OV: 06/05/2020  NEXT OV: Not Scheduled  PHARMACY: [facility] [place], [place]

## 2020-07-24 ENCOUNTER — Telehealth: Payer: Self-pay

## 2020-07-24 MED ORDER — METOPROLOL TARTRATE 25 MG PO TABS: 12.5000 mg | ORAL_TABLET | Freq: Two times a day (BID) | ORAL | 1 refills | Status: DC

## 2020-07-24 NOTE — Telephone Encounter (Signed)
Pt requesting refill for meloxicam 15 mg that was refilled electronically on 07/22/20 for # 90. Pt said she was not aware that had been refilled. Then pt requested refill on metoprolol tartrate 25 mg taking 1/2 tab bid. Pt had annual physical on 06/05/20 with D Carlean Purl FNP and at that time hypertension was well controlled on low dose metoprolol and pt was to return in 6 months. I refilled metoprolol tartrate 25 mg taking 1/2 tab bid # 90 x 1 per protocol(per schedule Dr Lorelei Pont is covering for Glenda Chroman FNP today). Pt voiced understanding and appreciative.Pt said she is still trying to decide on who she wants for new doctor. Nothing further at this time.

## 2020-08-01 DIAGNOSIS — M5136 Other intervertebral disc degeneration, lumbar region: Secondary | ICD-10-CM | POA: Diagnosis not present

## 2020-08-01 DIAGNOSIS — M419 Scoliosis, unspecified: Secondary | ICD-10-CM | POA: Diagnosis not present

## 2020-08-12 ENCOUNTER — Telehealth: Payer: Self-pay

## 2020-08-12 NOTE — Chronic Care Management (AMB) (Addendum)
° ° °  Chronic Care Management Pharmacy Assistant   Name: Junior Kenedy  MRN: [id number] DOB: May 01, 1951  Reason for Encounter: CCM appointment Cancellation  PCP : Elby Beck, FNP (Inactive)  Allergies:   Allergies  Allergen Reactions   Amoxicillin Diarrhea   Cephalexin    Prednisone Other (See Comments)    Sx's of heart attack - elevated BP, HR and chest tightness/pressure.   Bactrim Ds [Sulfamethoxazole-Trimethoprim] Other (See Comments)    Rash,itching,whelps, throat swelling and feels like lump in throat.   Codeine     REACTION: stomach upset   Doxycycline     GI side effects   Erythromycin     REACTION: stomach upset   Sulfamethizole Hives    Medications: Outpatient Encounter Medications as of 08/12/2020  Medication Sig   BIOTIN PO Take 1 capsule by mouth daily.    Calcium 1200-1000 MG-UNIT CHEW Chew 1 tablet by mouth daily.     cetirizine (ZYRTEC) 10 MG tablet Take 10 mg by mouth daily.     COLLAGEN PO Take 3 tablets by mouth daily. NeoCell Super Collagen C - takes 3 daily for hair, skin, nails   Cranberry-Vitamin C-Vitamin E 4200-20-3 MG-MG-UNIT CAPS Take by mouth. 1 daily   diclofenac sodium (VOLTAREN) 1 % GEL Apply topically as needed.    esomeprazole (NEXIUM) 40 MG capsule Take 1 capsule (40 mg total) by mouth daily. Take at least 1 hour before a meal.   fluticasone (FLONASE) 50 MCG/ACT nasal spray 2 sprays each nostril as directed.   FORTEO 620 MCG/2.48ML SOPN INJECT 20 MCG SUBCUTANEOUSLY ONCE DAILY. REFRIGERATE AND DISCARD PEN 28 DAYS AFTER INITIAL USE.   gabapentin (NEURONTIN) 300 MG capsule Take 300 mg by mouth in the morning, at noon, and at bedtime.    meloxicam (MOBIC) 15 MG tablet TAKE 1 TABLET DAILY   mesalamine (CANASA) 1000 MG suppository PLACE 1 SUPPOSITORY (1,000 MG TOTAL) RECTALLY AT BEDTIME AS NEEDED.   metoprolol tartrate (LOPRESSOR) 25 MG tablet Take 0.5 tablets (12.5 mg total) by mouth 2 (two) times daily.   Multiple  Vitamins-Minerals (MULTIVITAMIN PO) Take 1 tablet by mouth daily.   POTASSIUM GLUCONATE PO Take 1 capsule by mouth daily.   Probiotic Product (PROBIOTIC DAILY PO) Take by mouth.   No facility-administered encounter medications on file as of 08/12/2020.    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 Patient was informed appointment would be rescheduled once she has established care with a new provider since Tor Netters in no longer at the practice.   Follow-Up:  Pharmacist Review   [name], CPP notified  Margaretmary Dys, [place] [facility] Assistant [phone]

## 2020-08-22 ENCOUNTER — Ambulatory Visit (INDEPENDENT_AMBULATORY_CARE_PROVIDER_SITE_OTHER): Payer: Medicare HMO

## 2020-08-22 ENCOUNTER — Telehealth: Payer: Medicare HMO

## 2020-08-22 DIAGNOSIS — Z23 Encounter for immunization: Secondary | ICD-10-CM | POA: Diagnosis not present

## 2020-08-29 DIAGNOSIS — M5136 Other intervertebral disc degeneration, lumbar region: Secondary | ICD-10-CM | POA: Diagnosis not present

## 2020-08-29 DIAGNOSIS — M419 Scoliosis, unspecified: Secondary | ICD-10-CM | POA: Diagnosis not present

## 2020-09-26 DIAGNOSIS — M5136 Other intervertebral disc degeneration, lumbar region: Secondary | ICD-10-CM | POA: Diagnosis not present

## 2020-09-26 DIAGNOSIS — M419 Scoliosis, unspecified: Secondary | ICD-10-CM | POA: Diagnosis not present

## 2020-10-16 ENCOUNTER — Other Ambulatory Visit: Payer: Self-pay

## 2020-10-16 DIAGNOSIS — M545 Low back pain, unspecified: Secondary | ICD-10-CM

## 2020-10-16 DIAGNOSIS — G8929 Other chronic pain: Secondary | ICD-10-CM

## 2020-10-16 MED ORDER — MELOXICAM 15 MG PO TABS: 15.0000 mg | ORAL_TABLET | Freq: Every day | ORAL | 0 refills | Status: DC

## 2020-10-16 NOTE — Telephone Encounter (Signed)
Sent. Thanks.  Needs DM2 follow up/TOC appointment this spring.  We can do labs at the visit.

## 2020-10-16 NOTE — Telephone Encounter (Signed)
Last OV: 06/05/20 for Annual wellness Last refill: 07/22/20 #90 with 0 refills  No TOC scheduled with a provider

## 2020-11-13 ENCOUNTER — Telehealth: Payer: Self-pay

## 2020-11-13 NOTE — Telephone Encounter (Signed)
[place] Night - Client Nonclinical Telephone Record AccessNurse Client [place] Night - Client Client Site [place] Physician Webb Silversmith - NP Contact Type Call Who Is Calling Patient / Member / Family / Caregiver Caller Name [place] Phone Number [phone] Call Type Message Only Information Provided Reason for Call Returning a Call from the Office Initial [place] states she is returning a call from [place]. Additional Comment Provided office hours. Disp. Time Disposition Final User 11/12/2020 5:06:12 PM General Information Provided Yes Rica Mote Call Closed By: Rica Mote Transaction Date/Time: 11/12/2020 5:04:35 PM (ET)

## 2020-11-29 NOTE — Telephone Encounter (Signed)
I saw the note from Donzetta Matters; can this chart be closed?

## 2020-12-03 ENCOUNTER — Encounter: Payer: Medicare HMO | Admitting: Internal Medicine

## 2020-12-04 ENCOUNTER — Other Ambulatory Visit: Payer: Self-pay | Admitting: Internal Medicine

## 2020-12-04 ENCOUNTER — Ambulatory Visit (INDEPENDENT_AMBULATORY_CARE_PROVIDER_SITE_OTHER): Payer: Medicare HMO | Admitting: Internal Medicine

## 2020-12-04 ENCOUNTER — Encounter: Payer: Self-pay | Admitting: Internal Medicine

## 2020-12-04 ENCOUNTER — Other Ambulatory Visit: Payer: Self-pay

## 2020-12-04 VITALS — BP 122/82 | HR 80 | Ht 64.0 in | Wt 194.2 lb

## 2020-12-04 DIAGNOSIS — L304 Erythema intertrigo: Secondary | ICD-10-CM

## 2020-12-04 DIAGNOSIS — M810 Age-related osteoporosis without current pathological fracture: Secondary | ICD-10-CM | POA: Diagnosis not present

## 2020-12-04 LAB — COMPREHENSIVE METABOLIC PANEL
ALT: 28 U/L (ref 0–35)
AST: 30 U/L (ref 0–37)
Albumin: 4.2 g/dL (ref 3.5–5.2)
Alkaline Phosphatase: 110 U/L (ref 39–117)
BUN: 20 mg/dL (ref 6–23)
CO2: 30 mEq/L (ref 19–32)
Calcium: 10.1 mg/dL (ref 8.4–10.5)
Chloride: 101 mEq/L (ref 96–112)
Creatinine, Ser: 0.99 mg/dL (ref 0.40–1.20)
GFR: 57.22 mL/min — ABNORMAL LOW (ref >60.00)
Glucose, Bld: 117 mg/dL — ABNORMAL HIGH (ref 70–99)
Potassium: 4.4 mEq/L (ref 3.5–5.1)
Sodium: 139 mEq/L (ref 135–145)
Total Bilirubin: 0.5 mg/dL (ref 0.2–1.2)
Total Protein: 6.9 g/dL (ref 6.0–8.3)

## 2020-12-04 LAB — VITAMIN D 25 HYDROXY (VIT D DEFICIENCY, FRACTURES): VITD: 47.93 ng/mL (ref 30.00–100.00)

## 2020-12-04 MED ORDER — OXISTAT 1 % EX LOTN: TOPICAL_LOTION | Freq: Two times a day (BID) | CUTANEOUS | 1 refills | Status: DC

## 2020-12-04 MED ORDER — CLOTRIMAZOLE-BETAMETHASONE 1-0.05 % EX CREA: 1.0000 "application " | TOPICAL_CREAM | Freq: Two times a day (BID) | CUTANEOUS | 0 refills | Status: DC

## 2020-12-04 NOTE — Progress Notes (Signed)
Name: Brittney Pierce [facility]  MRN/ DOB: [id number], 01-18-1951    Age/ Sex: 70 y.o., female     PCP: Elby Beck, FNP (Inactive)   Reason for Endocrinology Evaluation: Osteoporosis      Initial Endocrinology Clinic Visit: 03/07/2019    PATIENT IDENTIFIER: Brittney Pierce is a 70 y.o., female with a past medical history of HTN, Ulcerative Colitis ( in remission for the past 8 yr), scoliosis and GERD . She has followed with [facility] since 03/07/2019 for consultative assistance with management of her Osteoporosis.      HISTORICAL SUMMARY:  The pt has been diagnosed with osteoporosis in 2020 with a right neck femur T-score of -2.6 . Due to pending surgery for scoliosis it was recommended by the surgeon to start her on Forteo which was done in 03/2019.   She is intolerant to calcium citrate due to severe constipation which exacerbated her proctitis.   SUBJECTIVE:    Today (12/04/2020):  Brittney Pierce is here for a follow up on osteoporosis.   She follows with ortho for scoliosis, has been in more pain . Switched Gabapentin to Lyrica  She is on diclofenac for arthritis pain    She continues on calcium and vitamin D chewables, intolerant to calcium citrate due to constipation which leads to proctitis   No local injection rash to forteo  She has intertrigo and needs a refill on     HISTORY:  Past Medical History:  Past Medical History:  Diagnosis Date  . Disorder of bone and cartilage, unspecified   . Dysuria   . Esophageal reflux   . Lumbago   . Osteoarthrosis, unspecified whether generalized or localized, unspecified site   . Ulcerative colitis, unspecified   . Unspecified essential hypertension    Past Surgical History:  Past Surgical History:  Procedure Laterality Date  . CATARACT EXTRACTION, BILATERAL     08/2019 and 09/2019  . CESAREAN SECTION     x2  . CHOLECYSTECTOMY    . [place] SURGERY  2008    Social History:   reports that she quit smoking about 40 years ago. She has never used smokeless tobacco. She reports current alcohol use. She reports that she does not use drugs. Family History:  Family History  Problem Relation Age of Onset  . Heart attack Father 41  . Diabetes Father   . Lung cancer Mother      HOME MEDICATIONS: Allergies as of 12/04/2020      Reactions   Amoxicillin Diarrhea   Cephalexin    Prednisone Other (See Comments)   Sx's of heart attack - elevated BP, HR and chest tightness/pressure.   Bactrim Ds [sulfamethoxazole-trimethoprim] Other (See Comments)   Rash,itching,whelps, throat swelling and feels like lump in throat.   Codeine    REACTION: stomach upset   Doxycycline    GI side effects   Erythromycin    REACTION: stomach upset   Sulfamethizole Hives      Medication List       Accurate as of Dec 04, 2020 10:58 AM. If you have any questions, ask your nurse or doctor.        BIOTIN PO Take 1 capsule by mouth daily.   Calcium 1200-1000 MG-UNIT Chew Chew 1 tablet by mouth daily.   cetirizine 10 MG tablet Commonly known as: ZYRTEC Take 10 mg by mouth daily.   COLLAGEN PO Take 3 tablets by mouth daily. NeoCell Super Collagen C - takes 3 daily  for hair, skin, nails   Cranberry-Vitamin C-Vitamin E 4200-20-3 MG-MG-UNIT Caps Take by mouth. 1 daily   diclofenac sodium 1 % Gel Commonly known as: VOLTAREN Apply topically as needed.   esomeprazole 40 MG capsule Commonly known as: NexIUM Take 1 capsule (40 mg total) by mouth daily. Take at least 1 hour before a meal.   fluticasone 50 MCG/ACT nasal spray Commonly known as: FLONASE 2 sprays each nostril as directed.   Forteo 620 MCG/2.48ML Sopn Generic drug: Teriparatide (Recombinant) INJECT 20 MCG SUBCUTANEOUSLY ONCE DAILY. REFRIGERATE AND DISCARD PEN 28 DAYS AFTER INITIAL USE.   gabapentin 300 MG capsule Commonly known as: NEURONTIN Take 300 mg by mouth in the morning, at noon, and at bedtime.    meloxicam 15 MG tablet Commonly known as: MOBIC Take 1 tablet (15 mg total) by mouth daily.   mesalamine 1000 MG suppository Commonly known as: CANASA PLACE 1 SUPPOSITORY (1,000 MG TOTAL) RECTALLY AT BEDTIME AS NEEDED.   metoprolol tartrate 25 MG tablet Commonly known as: LOPRESSOR Take 0.5 tablets (12.5 mg total) by mouth 2 (two) times daily.   MULTIVITAMIN PO Take 1 tablet by mouth daily.   Oxistat 1 % lotion Generic drug: oxiconazole Apply topically 2 (two) times daily. Started by: Dorita Sciara, MD   POTASSIUM GLUCONATE PO Take 1 capsule by mouth daily.   PROBIOTIC DAILY PO Take by mouth.         OBJECTIVE:   PHYSICAL EXAM: VS: BP 122/82   Pulse 80   Ht 5' 4"  (1.626 m)   Wt 194 lb 4 oz (88.1 kg)   SpO2 98%   BMI 33.34 kg/m    EXAM: General: Pt appears well and is in NAD  Lungs: Clear with good BS bilat with no rales, rhonchi, or wheezes  Heart: Auscultation: RRR.  Abdomen: Normoactive bowel sounds, soft, nontender, without masses or organomegaly palpable  Extremities:  BL LE: No pretibial edema normal ROM and strength.  Mental Status: Judgment, insight: Intact Mood and affect: No depression, anxiety, or agitation     DATA REVIEWED: Results for Brittney Pierce, Brittney Pierce (MRN [id number]) as of 12/05/2020 12:40  Ref. Range 12/04/2020 11:07  Sodium Latest Ref Range: 135 - 145 mEq/L 139  Potassium Latest Ref Range: 3.5 - 5.1 mEq/L 4.4  Chloride Latest Ref Range: 96 - 112 mEq/L 101  CO2 Latest Ref Range: 19 - 32 mEq/L 30  Glucose Latest Ref Range: 70 - 99 mg/dL 117 (H)  BUN Latest Ref Range: 6 - 23 mg/dL 20  Creatinine Latest Ref Range: 0.40 - 1.20 mg/dL 0.99  Calcium Latest Ref Range: 8.4 - 10.5 mg/dL 10.1  Alkaline Phosphatase Latest Ref Range: 39 - 117 U/L 110  Albumin Latest Ref Range: 3.5 - 5.2 g/dL 4.2  AST Latest Ref Range: 0 - 37 U/L 30  ALT Latest Ref Range: 0 - 35 U/L 28  Total Protein Latest Ref Range: 6.0 - 8.3 g/dL 6.9  Total  Bilirubin Latest Ref Range: 0.2 - 1.2 mg/dL 0.5  GFR Latest Ref Range: >60.00 mL/min 57.22 (L)  VITD Latest Ref Range: 30.00 - 100.00 ng/mL 47.93     ASSESSMENT / PLAN / RECOMMENDATIONS:   1. Osteoporosis:   - She is tolerating forteo without side effects - She was unable to tolerate calcium citrate ( recommended due to better absorption profile in the setting of chronic PPI use) , she can continue her current chewable Ca/vit D -Vitamin D is normal, BMP shows slightly reduced GFR patient  is on NSAIDs I will recommend close monitoring to Ortho - Will repeat DXA around 03/2020. Will consider then what alternative anti-resorptive therapy to start on. -She will complete the Forteo injections by September, 2022 -She is scheduled for repeat bone density in October, 2022   Medications   Forteo 20 mg daily  Calcium/ vitamin D    2.  Intertrigo  -She has this for many years, she requested a refill on an old medication that she has not had refilled in 10 years.  Insurance company is not covering [place] has been sent as an alternative    Medication  Lotrisone twice daily as needed   Follow-up in 1 year Signed electronically by: Mack Guise, MD  [facility]  [address] [place], [place] [address] Phone: [phone] FAX: [phone]      CC: Elby Beck, FNP (Inactive) No address on file Phone: None  Fax: None   Return to Endocrinology clinic as below: Future Appointments  Date Time Provider [place]  05/16/2021 11:00 AM [facility] DX DEXA 1 [facility]  05/30/2021 11:15 AM [facility] NURSE HEALTH ADVISOR [facility] PEC

## 2020-12-04 NOTE — Telephone Encounter (Signed)
Try doing PA?

## 2020-12-04 NOTE — Patient Instructions (Signed)
-   Continue Forteo daily with the last month  Being September, 2022 - Your Bone density is schedule for October, 2022

## 2020-12-04 NOTE — Telephone Encounter (Signed)
No PA. I sent alternate. Lotrisone

## 2020-12-05 NOTE — Telephone Encounter (Signed)
Not at this time. I will call her back again. EM

## 2020-12-05 NOTE — Telephone Encounter (Signed)
Is there any thing further needed to be done before closing this note?

## 2020-12-10 ENCOUNTER — Other Ambulatory Visit: Payer: Self-pay | Admitting: *Deleted

## 2020-12-10 MED ORDER — METOPROLOL TARTRATE 25 MG PO TABS: 12.5000 mg | ORAL_TABLET | Freq: Two times a day (BID) | ORAL | 1 refills | Status: DC

## 2020-12-10 NOTE — Telephone Encounter (Signed)
Received a faxed refill request from [facility] for Metoprolol 25 mg. Last office visit 06/05/20 Last refill 07/24/20 #90/1 Upcoming appointment scheduled with Laverna Peace FNP  [facility] 03/11/21

## 2020-12-26 DIAGNOSIS — M419 Scoliosis, unspecified: Secondary | ICD-10-CM | POA: Diagnosis not present

## 2020-12-26 DIAGNOSIS — M5136 Other intervertebral disc degeneration, lumbar region: Secondary | ICD-10-CM | POA: Diagnosis not present

## 2020-12-31 DIAGNOSIS — Z9842 Cataract extraction status, left eye: Secondary | ICD-10-CM | POA: Diagnosis not present

## 2020-12-31 DIAGNOSIS — Z9841 Cataract extraction status, right eye: Secondary | ICD-10-CM | POA: Diagnosis not present

## 2020-12-31 DIAGNOSIS — H52223 Regular astigmatism, bilateral: Secondary | ICD-10-CM | POA: Diagnosis not present

## 2021-01-16 ENCOUNTER — Other Ambulatory Visit: Payer: Self-pay | Admitting: Family Medicine

## 2021-01-16 DIAGNOSIS — G8929 Other chronic pain: Secondary | ICD-10-CM

## 2021-01-16 NOTE — Telephone Encounter (Signed)
Refill request for Mobic 15 mg tablets  LOV - 06/05/20 Next OV - 03/11/21 Last refill - 10/16/20 #90/0

## 2021-02-27 DIAGNOSIS — M5136 Other intervertebral disc degeneration, lumbar region: Secondary | ICD-10-CM | POA: Diagnosis not present

## 2021-02-27 DIAGNOSIS — M419 Scoliosis, unspecified: Secondary | ICD-10-CM | POA: Diagnosis not present

## 2021-03-11 ENCOUNTER — Encounter: Admitting: Adult Health

## 2021-03-13 ENCOUNTER — Encounter: Payer: Self-pay | Admitting: Family Medicine

## 2021-04-10 DIAGNOSIS — H10012 Acute follicular conjunctivitis, left eye: Secondary | ICD-10-CM | POA: Diagnosis not present

## 2021-04-15 ENCOUNTER — Encounter: Admitting: Adult Health

## 2021-04-22 ENCOUNTER — Ambulatory Visit (INDEPENDENT_AMBULATORY_CARE_PROVIDER_SITE_OTHER)
Admission: RE | Admit: 2021-04-22 | Discharge: 2021-04-22 | Disposition: A | Discharge status: Home / Self Care | Payer: Medicare HMO | Source: Ambulatory Visit | Attending: Nurse Practitioner | Admitting: Nurse Practitioner

## 2021-04-22 ENCOUNTER — Other Ambulatory Visit: Payer: Self-pay

## 2021-04-22 ENCOUNTER — Encounter: Payer: Self-pay | Admitting: Nurse Practitioner

## 2021-04-22 ENCOUNTER — Ambulatory Visit (INDEPENDENT_AMBULATORY_CARE_PROVIDER_SITE_OTHER): Payer: Medicare HMO | Admitting: Nurse Practitioner

## 2021-04-22 VITALS — BP 140/76 | HR 92 | Temp 98.0°F | Resp 14 | Ht 64.0 in | Wt 190.1 lb

## 2021-04-22 DIAGNOSIS — J029 Acute pharyngitis, unspecified: Secondary | ICD-10-CM

## 2021-04-22 DIAGNOSIS — I10 Essential (primary) hypertension: Secondary | ICD-10-CM | POA: Diagnosis not present

## 2021-04-22 DIAGNOSIS — K51219 Ulcerative (chronic) proctitis with unspecified complications: Secondary | ICD-10-CM

## 2021-04-22 DIAGNOSIS — M545 Low back pain, unspecified: Secondary | ICD-10-CM

## 2021-04-22 DIAGNOSIS — K219 Gastro-esophageal reflux disease without esophagitis: Secondary | ICD-10-CM

## 2021-04-22 DIAGNOSIS — J020 Streptococcal pharyngitis: Secondary | ICD-10-CM

## 2021-04-22 DIAGNOSIS — R059 Cough, unspecified: Secondary | ICD-10-CM | POA: Diagnosis not present

## 2021-04-22 DIAGNOSIS — J0110 Acute frontal sinusitis, unspecified: Secondary | ICD-10-CM | POA: Diagnosis not present

## 2021-04-22 DIAGNOSIS — G8929 Other chronic pain: Secondary | ICD-10-CM

## 2021-04-22 DIAGNOSIS — E1165 Type 2 diabetes mellitus with hyperglycemia: Secondary | ICD-10-CM | POA: Diagnosis not present

## 2021-04-22 LAB — LIPID PANEL
Cholesterol: 151 mg/dL (ref 0–200)
HDL: 44.9 mg/dL (ref >39.00)
LDL Cholesterol: 73 mg/dL (ref 0–99)
NonHDL: 106.5
Total CHOL/HDL Ratio: 3
Triglycerides: 169 mg/dL — ABNORMAL HIGH (ref 0.0–149.0)
VLDL: 33.8 mg/dL (ref 0.0–40.0)

## 2021-04-22 LAB — COMPREHENSIVE METABOLIC PANEL
ALT: 27 U/L (ref 0–35)
AST: 36 U/L (ref 0–37)
Albumin: 3.9 g/dL (ref 3.5–5.2)
Alkaline Phosphatase: 101 U/L (ref 39–117)
BUN: 12 mg/dL (ref 6–23)
CO2: 27 mEq/L (ref 19–32)
Calcium: 9.7 mg/dL (ref 8.4–10.5)
Chloride: 99 mEq/L (ref 96–112)
Creatinine, Ser: 0.96 mg/dL (ref 0.40–1.20)
GFR: 59.21 mL/min — ABNORMAL LOW (ref >60.00)
Glucose, Bld: 128 mg/dL — ABNORMAL HIGH (ref 70–99)
Potassium: 4.1 mEq/L (ref 3.5–5.1)
Sodium: 134 mEq/L — ABNORMAL LOW (ref 135–145)
Total Bilirubin: 0.5 mg/dL (ref 0.2–1.2)
Total Protein: 6.9 g/dL (ref 6.0–8.3)

## 2021-04-22 LAB — CBC
HCT: 39.4 % (ref 36.0–46.0)
Hemoglobin: 13.1 g/dL (ref 12.0–15.0)
MCHC: 33.3 g/dL (ref 30.0–36.0)
MCV: 86.2 fl (ref 78.0–100.0)
Platelets: 270 10*3/uL (ref 150.0–400.0)
RBC: 4.57 Mil/uL (ref 3.87–5.11)
RDW: 14 % (ref 11.5–15.5)
WBC: 8.4 10*3/uL (ref 4.0–10.5)

## 2021-04-22 LAB — HEMOGLOBIN A1C: Hgb A1c MFr Bld: 6.7 % — ABNORMAL HIGH (ref 4.6–6.5)

## 2021-04-22 LAB — POCT RAPID STREP A (OFFICE): Rapid Strep A Screen: POSITIVE — AB

## 2021-04-22 MED ORDER — CLINDAMYCIN HCL 300 MG PO CAPS: 300.0000 mg | ORAL_CAPSULE | Freq: Three times a day (TID) | ORAL | 0 refills | Status: AC

## 2021-04-22 NOTE — Assessment & Plan Note (Signed)
Patient having two illness going on at the same time. We did have the discussion about the use of clindamycin. I asked her whether she could tolerate other antibiotics. States that she is unsure of the exact reaction. She has had reactions that involve swelling of her air way. I did question the amoxicillin. She is unsure if she just had the diarrhea that was listed or not. Discussed the increased risk of C. Diff with this medication. She acknowledge. Will monitor her

## 2021-04-22 NOTE — Assessment & Plan Note (Signed)
Currently maintained on Nexium 40 daily.  Did have a upper GI back in 2010.  None since.  Continue Nexium 40 mg as prescribed avoid triggers as possible

## 2021-04-22 NOTE — Progress Notes (Signed)
Established Patient Office Visit  Subjective:  Patient ID: Brittney Pierce, female    DOB: [birth year]  Age: 70 y.o. MRN: [id number]  CC:  Chief Complaint  Patient presents with   Transfer of Care   Sore Throat    Upper chest congestion, h/as, fever last night 100.1. States her granddaughter who goes to daycare and patient lives with, had RSV and patient felt sick since 04/02/21 now with her symptoms and figured she had RSV. Covid test at home 04/21/21 negative.    HPI Brittney Pierce presents for [facility]  HTN: Metoprolol daily. Does not check blood pressure at home GERD: Esomeprazole. Has not seen GI in an extended of time. Last endo was 2010 Ulcerative Colitis: In remission with probiotics and not currently seeing GI. Osteopenia: Oct DEXA. Forteo for 2 years. Prediabetes: Not on medication. Exercise is limited to back discomfort. States that she has reduced sugar in her deit LBP: Dr. Earlean Polka for spine and scolosis. Was taking forteo injections. Just finished   Sore throat: symptoms started on the 7th. Grand-daughter was dx with RSV. Patient assumed the same. Last week she felt better for 2 days, then on Friday she felt worse again. Past Medical History:  Diagnosis Date   Disorder of bone and cartilage, unspecified    Dysuria    Esophageal reflux    Lumbago    Osteoarthrosis, unspecified whether generalized or localized, unspecified site    Ulcerative colitis, unspecified    Unspecified essential hypertension     Past Surgical History:  Procedure Laterality Date   CATARACT EXTRACTION, BILATERAL     08/2019 and 09/2019   CESAREAN SECTION     x2   [place] SURGERY  2008    Family History  Problem Relation Age of Onset   Heart attack Father 9   Diabetes Father    Lung cancer Mother     Social History   Socioeconomic History   Marital status: Widowed    Spouse name: Not on file   Number of children: Not on file   Years of  education: Not on file   Highest education level: Not on file  Occupational History   Occupation: Retired  Tobacco Use   Smoking status: Former    Types: Cigarettes    Quit date: 07/27/1980    Years since quitting: 40.7   Smokeless tobacco: Never  Vaping Use   Vaping Use: Never used  Substance and Sexual Activity   Alcohol use: Yes    Comment: Once a month (maybe)   Drug use: No   Sexual activity: Not Currently  Other Topics Concern   Not on file  Social History Narrative   Regular exercise-yes      Moved here from [place] to be closer to grandchildren      Likes to garden      Does have a living will.  Husband is HPOA.   Would desire life support, no prolonged life support if futile.            Social Determinants of Health   Financial Resource Strain: Low Risk    Difficulty of Paying Living Expenses: Not hard at all  Food Insecurity: No Food Insecurity   Worried About Charity fundraiser in the Last Year: Never true   [place] in the Last Year: Never true  Transportation Needs: No Transportation Needs   Lack of Transportation (Medical): No   Lack of  Transportation (Non-Medical): No  Physical Activity: Inactive   Days of Exercise per Week: 0 days   Minutes of Exercise per Session: 0 min  Stress: No Stress Concern Present   Feeling of Stress : Not at all  Social Connections: Not on file  Intimate Partner Violence: Not At Risk   Fear of Current or Ex-Partner: No   Emotionally Abused: No   Physically Abused: No   Sexually Abused: No    Outpatient Medications Prior to Visit  Medication Sig Dispense Refill   BIOTIN PO Take 1 capsule by mouth daily.      Calcium 1200-1000 MG-UNIT CHEW Chew 1 tablet by mouth daily.     cetirizine (ZYRTEC) 10 MG tablet Take 10 mg by mouth daily.     clotrimazole-betamethasone (LOTRISONE) cream Apply 1 application topically 2 (two) times daily. 30 g 0   Cranberry-Vitamin C-Vitamin E 4200-20-3 MG-MG-UNIT CAPS Take by mouth. 1 daily      diclofenac sodium (VOLTAREN) 1 % GEL Apply topically as needed.      esomeprazole (NEXIUM) 40 MG capsule Take 1 capsule (40 mg total) by mouth daily. Take at least 1 hour before a meal. 90 capsule 3   fluticasone (FLONASE) 50 MCG/ACT nasal spray 2 sprays each nostril as directed. 48 g 1   meloxicam (MOBIC) 15 MG tablet TAKE 1 TABLET (15 MG TOTAL) BY MOUTH DAILY. 90 tablet 0   mesalamine (CANASA) 1000 MG suppository PLACE 1 SUPPOSITORY (1,000 MG TOTAL) RECTALLY AT BEDTIME AS NEEDED. 30 suppository 0   metoprolol tartrate (LOPRESSOR) 25 MG tablet Take 0.5 tablets (12.5 mg total) by mouth 2 (two) times daily. 90 tablet 1   Multiple Vitamins-Minerals (MULTIVITAMIN PO) Take 1 tablet by mouth daily.     POTASSIUM GLUCONATE PO Take 1 capsule by mouth daily.     pregabalin (LYRICA) 75 MG capsule Take 75 mg by mouth 2 (two) times daily.     Probiotic Product (PROBIOTIC DAILY PO) Take by mouth.     traMADol (ULTRAM) 50 MG tablet Take 50 mg by mouth daily as needed.     FORTEO 620 MCG/2.48ML SOPN INJECT 20 MCG SUBCUTANEOUSLY ONCE DAILY. REFRIGERATE AND DISCARD PEN 28 DAYS AFTER INITIAL USE. 3 mL 10   COLLAGEN PO Take 3 tablets by mouth daily. NeoCell Super Collagen C - takes 3 daily for hair, skin, nails     gabapentin (NEURONTIN) 300 MG capsule Take 300 mg by mouth in the morning, at noon, and at bedtime.      No facility-administered medications prior to visit.    Allergies  Allergen Reactions   Amoxicillin Diarrhea   Cephalexin    Prednisone Other (See Comments)    Sx's of heart attack - elevated BP, HR and chest tightness/pressure.   Bactrim Ds [Sulfamethoxazole-Trimethoprim] Other (See Comments)    Rash,itching,whelps, throat swelling and feels like lump in throat.   Codeine     REACTION: stomach upset   Doxycycline     GI side effects   Erythromycin     REACTION: stomach upset   Sulfamethizole Hives    ROS Review of Systems  Constitutional:  Positive for chills, fatigue and  fever.  HENT:  Positive for congestion, sinus pressure and sore throat.   Respiratory:  Positive for cough and shortness of breath (DOE).   Cardiovascular:  Negative for chest pain.  Gastrointestinal:  Negative for abdominal pain, diarrhea, nausea and vomiting.  Neurological:  Positive for headaches.  Psychiatric/Behavioral:  Negative for hallucinations and suicidal  ideas.      Objective:    Physical Exam Vitals and nursing note reviewed.  HENT:     Head:      Right Ear: Tympanic membrane, ear canal and external ear normal. There is no impacted cerumen.     Left Ear: Tympanic membrane, ear canal and external ear normal. There is no impacted cerumen.     Mouth/Throat:     Mouth: Mucous membranes are moist.     Pharynx: Posterior oropharyngeal erythema present.  Eyes:     Extraocular Movements: Extraocular movements intact.     Pupils: Pupils are equal, round, and reactive to light.  Neck:     Comments: Submandibular Cardiovascular:     Rate and Rhythm: Normal rate and regular rhythm.  Pulmonary:     Effort: Pulmonary effort is normal.     Breath sounds: Normal breath sounds.  Abdominal:     General: Bowel sounds are normal.  Lymphadenopathy:     Cervical: Cervical adenopathy present.  Skin:    General: Skin is warm.  Psychiatric:        Mood and Affect: Mood normal.        Behavior: Behavior normal.        Thought Content: Thought content normal.        Judgment: Judgment normal.    BP 140/76   Pulse 92   Temp 98 F (36.7 C)   Resp 14   Ht 5' 4"  (1.626 m)   Wt 190 lb 1 oz (86.2 kg)   SpO2 95%   BMI 32.62 kg/m  Wt Readings from Last 3 Encounters:  04/22/21 190 lb 1 oz (86.2 kg)  12/04/20 194 lb 4 oz (88.1 kg)  06/05/20 186 lb (84.4 kg)     Health Maintenance Due  Topic Date Due   MAMMOGRAM  12/27/2020    There are no preventive care reminders to display for this patient.  Lab Results  Component Value Date   TSH 1.79 03/07/2019   Lab Results   Component Value Date   WBC 9.5 05/29/2020   HGB 13.1 05/29/2020   HCT 39.4 05/29/2020   MCV 86.9 05/29/2020   PLT 249.0 05/29/2020   Lab Results  Component Value Date   NA 139 12/04/2020   K 4.4 12/04/2020   CO2 30 12/04/2020   GLUCOSE 117 (H) 12/04/2020   BUN 20 12/04/2020   CREATININE 0.99 12/04/2020   BILITOT 0.5 12/04/2020   ALKPHOS 110 12/04/2020   AST 30 12/04/2020   ALT 28 12/04/2020   PROT 6.9 12/04/2020   ALBUMIN 4.2 12/04/2020   CALCIUM 10.1 12/04/2020   ANIONGAP 10 03/22/2019   GFR 57.22 (L) 12/04/2020   Lab Results  Component Value Date   CHOL 168 05/29/2020   Lab Results  Component Value Date   HDL 49.50 05/29/2020   Lab Results  Component Value Date   LDLCALC 90 05/29/2020   Lab Results  Component Value Date   TRIG 142.0 05/29/2020   Lab Results  Component Value Date   CHOLHDL 3 05/29/2020   Lab Results  Component Value Date   HGBA1C 7.0 (H) 05/29/2020      Assessment & Plan:   Problem List Items Addressed This Visit       Cardiovascular and Mediastinum   Essential hypertension    Currently maintained on metoprolol.  Is not followed by cardiology.  Last ECG reviewed 02/2019.  Does not check her blood pressure at home.  Continue metoprolol  as prescribed.        Respiratory   Strep pharyngitis    Patient states she lives with her daughter and granddaughter.  States her granddaughter was tested and had RSV.  States she seemed she had a sinus started on 04/02/2021.  Last week she felt better for approximately 2 days and then past Friday she started having symptoms again and felt worse.  Strep test in office is positive.  Patient does have a host of allergies.  Pending chest x-ray before making antibiotic choice.  Patient made aware and acknowledged        Digestive   GERD    Currently maintained on Nexium 40 daily.  Did have a upper GI back in 2010.  None since.  Continue Nexium 40 mg as prescribed avoid triggers as possible       Ulcerative colitis ([place])    Has been in remission for some time.  Does use mesalamine if needed has not been to an extended period of time.  Continue to monitor        Endocrine   Type 2 diabetes mellitus with hyperglycemia, without long-term current use of insulin ([place])    Patient's last A1c was 7.0%.  Patient has been working on reducing sugar from her diet and has lost 4 pounds.  Will check A1c today pending lab results      Relevant Orders   CBC   Comprehensive metabolic panel   Hemoglobin A1c   Lipid panel     Other   BACK PAIN, LUMBAR, CHRONIC    Seen by Ortho and scoliosis specialty states bones too brittle for surgery.  She is maintained on Lyrica twice daily and has been using Forteo to help bone strength.  Is followed by endocrinology states she is due for a DEXA scan in October this year.  She also has prescription for tramadol that she takes rarely states that she is taken 2 tablets in the past couple months.      Relevant Medications   traMADol (ULTRAM) 50 MG tablet   Cough   Relevant Orders   DG Chest 2 View   Sore throat - Primary   Relevant Orders   Rapid Strep A (Completed)   Reviewed chest xray with patient over the phone after clinic.  No orders of the defined types were placed in this encounter.   Follow-up: Return in about 6 months (around 10/20/2021).   This visit occurred during the SARS-CoV-2 public health emergency.  Safety protocols were in place, including screening questions prior to the visit, additional usage of staff PPE, and extensive cleaning of exam room while observing appropriate contact time as indicated for disinfecting solutions.   Romilda Garret, NP

## 2021-04-22 NOTE — Patient Instructions (Addendum)
Nice to see you Will await CXR read Will be in touch regarding your labs

## 2021-04-22 NOTE — Assessment & Plan Note (Signed)
Patient states she lives with her daughter and granddaughter.  States her granddaughter was tested and had RSV.  States she seemed she had a sinus started on 04/02/2021.  Last week she felt better for approximately 2 days and then past Friday she started having symptoms again and felt worse.  Strep test in office is positive.  Patient does have a host of allergies.  Pending chest x-ray before making antibiotic choice.  Patient made aware and acknowledged

## 2021-04-22 NOTE — Assessment & Plan Note (Addendum)
Seen by Ortho and scoliosis specialty states bones too brittle for surgery.  She is maintained on Lyrica twice daily and has been using Forteo to help bone strength.  Is followed by endocrinology states she is due for a DEXA scan in October this year.  She also has prescription for tramadol that she takes rarely states that she is taken 2 tablets in the past couple months.

## 2021-04-22 NOTE — Assessment & Plan Note (Signed)
Currently maintained on metoprolol.  Is not followed by cardiology.  Last ECG reviewed 02/2019.  Does not check her blood pressure at home.  Continue metoprolol as prescribed.

## 2021-04-22 NOTE — Assessment & Plan Note (Signed)
Has been in remission for some time.  Does use mesalamine if needed has not been to an extended period of time.  Continue to monitor

## 2021-04-22 NOTE — Assessment & Plan Note (Signed)
Patient's last A1c was 7.0%.  Patient has been working on reducing sugar from her diet and has lost 4 pounds.  Will check A1c today pending lab results

## 2021-05-01 ENCOUNTER — Encounter: Admitting: Adult Health

## 2021-05-11 DIAGNOSIS — N3001 Acute cystitis with hematuria: Secondary | ICD-10-CM | POA: Diagnosis not present

## 2021-05-16 ENCOUNTER — Ambulatory Visit
Admission: RE | Admit: 2021-05-16 | Discharge: 2021-05-16 | Disposition: A | Discharge status: Home / Self Care | Payer: Medicare HMO | Source: Ambulatory Visit | Attending: Internal Medicine | Admitting: Internal Medicine

## 2021-05-16 ENCOUNTER — Other Ambulatory Visit: Payer: Self-pay

## 2021-05-16 DIAGNOSIS — M810 Age-related osteoporosis without current pathological fracture: Secondary | ICD-10-CM | POA: Diagnosis not present

## 2021-05-16 DIAGNOSIS — Z780 Asymptomatic menopausal state: Secondary | ICD-10-CM | POA: Diagnosis not present

## 2021-05-16 DIAGNOSIS — M85832 Other specified disorders of bone density and structure, left forearm: Secondary | ICD-10-CM | POA: Diagnosis not present

## 2021-05-19 ENCOUNTER — Telehealth: Payer: Self-pay | Admitting: Internal Medicine

## 2021-05-19 DIAGNOSIS — M545 Low back pain, unspecified: Secondary | ICD-10-CM | POA: Diagnosis not present

## 2021-05-19 DIAGNOSIS — Z03818 Encounter for observation for suspected exposure to other biological agents ruled out: Secondary | ICD-10-CM | POA: Diagnosis not present

## 2021-05-19 DIAGNOSIS — Z20822 Contact with and (suspected) exposure to covid-19: Secondary | ICD-10-CM | POA: Diagnosis not present

## 2021-05-19 NOTE — Telephone Encounter (Signed)
Attempted to contact the patient over the phone on 05/19/2021 at 1350  Left a message for the patient to check the portal    Her DEXA scan shows significant improvement in BMD at the hips, no improvement at the distal forearm  Given history of ulcerative colitis and GERD I am going to recommend Reclast annually   A portal message will be sent   Abby Nena Jordan, MD  [facility]  [facility] [address] [place], [place] [address] Phone: [phone] FAX: [phone]

## 2021-05-29 DIAGNOSIS — M419 Scoliosis, unspecified: Secondary | ICD-10-CM | POA: Diagnosis not present

## 2021-05-29 DIAGNOSIS — M5136 Other intervertebral disc degeneration, lumbar region: Secondary | ICD-10-CM | POA: Diagnosis not present

## 2021-05-30 ENCOUNTER — Ambulatory Visit

## 2021-05-30 ENCOUNTER — Ambulatory Visit: Payer: Medicare HMO

## 2021-06-23 DIAGNOSIS — N3001 Acute cystitis with hematuria: Secondary | ICD-10-CM | POA: Diagnosis not present

## 2021-07-28 ENCOUNTER — Other Ambulatory Visit: Payer: Self-pay | Admitting: Family

## 2021-07-30 DIAGNOSIS — L578 Other skin changes due to chronic exposure to nonionizing radiation: Secondary | ICD-10-CM | POA: Diagnosis not present

## 2021-07-30 DIAGNOSIS — Z85828 Personal history of other malignant neoplasm of skin: Secondary | ICD-10-CM | POA: Diagnosis not present

## 2021-07-30 DIAGNOSIS — L570 Actinic keratosis: Secondary | ICD-10-CM | POA: Diagnosis not present

## 2021-07-30 DIAGNOSIS — B372 Candidiasis of skin and nail: Secondary | ICD-10-CM | POA: Diagnosis not present

## 2021-07-30 DIAGNOSIS — Z872 Personal history of diseases of the skin and subcutaneous tissue: Secondary | ICD-10-CM | POA: Diagnosis not present

## 2021-07-30 DIAGNOSIS — L821 Other seborrheic keratosis: Secondary | ICD-10-CM | POA: Diagnosis not present

## 2021-08-19 ENCOUNTER — Ambulatory Visit: Payer: Medicare HMO

## 2021-09-01 DIAGNOSIS — Z79899 Other long term (current) drug therapy: Secondary | ICD-10-CM | POA: Diagnosis not present

## 2021-09-01 DIAGNOSIS — M419 Scoliosis, unspecified: Secondary | ICD-10-CM | POA: Diagnosis not present

## 2021-09-01 DIAGNOSIS — M5136 Other intervertebral disc degeneration, lumbar region: Secondary | ICD-10-CM | POA: Diagnosis not present

## 2021-09-01 DIAGNOSIS — G894 Chronic pain syndrome: Secondary | ICD-10-CM | POA: Diagnosis not present

## 2021-09-01 DIAGNOSIS — Z79891 Long term (current) use of opiate analgesic: Secondary | ICD-10-CM | POA: Diagnosis not present

## 2021-09-08 ENCOUNTER — Ambulatory Visit: Admitting: Nurse Practitioner

## 2021-09-10 ENCOUNTER — Other Ambulatory Visit: Payer: Self-pay

## 2021-09-10 ENCOUNTER — Ambulatory Visit (INDEPENDENT_AMBULATORY_CARE_PROVIDER_SITE_OTHER)
Admission: RE | Admit: 2021-09-10 | Discharge: 2021-09-10 | Disposition: A | Discharge status: Home / Self Care | Payer: Medicare PPO | Source: Ambulatory Visit | Attending: Nurse Practitioner | Admitting: Nurse Practitioner

## 2021-09-10 ENCOUNTER — Ambulatory Visit (INDEPENDENT_AMBULATORY_CARE_PROVIDER_SITE_OTHER): Payer: Medicare PPO | Admitting: Nurse Practitioner

## 2021-09-10 VITALS — BP 130/82 | HR 67 | Temp 97.2°F | Resp 12 | Ht 64.0 in | Wt 188.2 lb

## 2021-09-10 DIAGNOSIS — Z760 Encounter for issue of repeat prescription: Secondary | ICD-10-CM

## 2021-09-10 DIAGNOSIS — M25512 Pain in left shoulder: Secondary | ICD-10-CM | POA: Insufficient documentation

## 2021-09-10 DIAGNOSIS — M7542 Impingement syndrome of left shoulder: Secondary | ICD-10-CM | POA: Insufficient documentation

## 2021-09-10 DIAGNOSIS — E1165 Type 2 diabetes mellitus with hyperglycemia: Secondary | ICD-10-CM | POA: Diagnosis not present

## 2021-09-10 DIAGNOSIS — I10 Essential (primary) hypertension: Secondary | ICD-10-CM

## 2021-09-10 DIAGNOSIS — M19012 Primary osteoarthritis, left shoulder: Secondary | ICD-10-CM | POA: Diagnosis not present

## 2021-09-10 LAB — POCT GLYCOSYLATED HEMOGLOBIN (HGB A1C): Hemoglobin A1C: 6.5 % — AB (ref 4.0–5.6)

## 2021-09-10 MED ORDER — METOPROLOL TARTRATE 25 MG PO TABS: 12.5000 mg | ORAL_TABLET | Freq: Two times a day (BID) | ORAL | 1 refills | Status: DC

## 2021-09-10 MED ORDER — MELOXICAM 15 MG PO TABS: 15.0000 mg | ORAL_TABLET | Freq: Every day | ORAL | 0 refills | Status: DC

## 2021-09-10 MED ORDER — ROSUVASTATIN CALCIUM 5 MG PO TABS: 5.0000 mg | ORAL_TABLET | Freq: Every day | ORAL | 1 refills | Status: DC

## 2021-09-10 MED ORDER — ESOMEPRAZOLE MAGNESIUM 40 MG PO CPDR: 40.0000 mg | DELAYED_RELEASE_CAPSULE | Freq: Every day | ORAL | 1 refills | Status: DC

## 2021-09-10 NOTE — Assessment & Plan Note (Signed)
Patient blood pressure within goal in office today.  Patient does not check her blood pressure at home recommended she start checking her blood pressure especially when she feels lightheaded.  Do feel the lightheadedness is to deal with patient standing and when needed for extended period time heat from the stove and possible locking of the knees.  Patient states this will abate once she starts moving and walks with the stove.  Continue medications as prescribed

## 2021-09-10 NOTE — Assessment & Plan Note (Signed)
Currently controlled utilizing diet.  Patient's A1c was good in the office 6.5%.  Did have a detailed discussion about patient's ASCVD risk.  See below: The 10-year ASCVD risk score (Arnett DK, et al., 2019) is: 27.6%   Values used to calculate the score:     Age: 71 years     Sex: Female     Is Non-Hispanic African American: No     Diabetic: Yes     Tobacco smoker: No     Systolic Blood Pressure: 848 mmHg     Is BP treated: Yes     HDL Cholesterol: 44.9 mg/dL     Total Cholesterol: 151 mg/dL  Given patient's high ASCVD risk percentage we did have a joint discussion and agreed to put patient on a lipid-lowering agent we will start out with rosuvastatin 5 mg.  Follow-up in 3 months recheck lipids and liver function.

## 2021-09-10 NOTE — Patient Instructions (Signed)
Nice to see you today I am going to get an xray of your shoulder. I am starting you on a cholesterol medication because of the risk of having a stroke or heart attack The 10-year ASCVD risk score (Arnett DK, et al., 2019) is: 27.6%   Values used to calculate the score:     Age: 71 years     Sex: Female     Is Non-Hispanic African American: No     Diabetic: Yes     Tobacco smoker: No     Systolic Blood Pressure: 377 mmHg     Is BP treated: Yes     HDL Cholesterol: 44.9 mg/dL     Total Cholesterol: 151 mg/dL  I want to see you back in 3 months for a physical and recheck

## 2021-09-10 NOTE — Assessment & Plan Note (Signed)
After lifting her granddaughter.  This was getting better and reaggravated with same motions.  Patient states that she lives with her granddaughter and is hard to not pick her up when she requests that.  Recommend continuing meloxicam and other prescribed medications for pain try to restrict use of arm in regards to lifting up grandchild.  Pending x-ray results

## 2021-09-10 NOTE — Progress Notes (Signed)
Established Patient Office Visit  Subjective:  Patient ID: Brittney Pierce, female    DOB: 1951/07/08  Age: 71 y.o. MRN: [id number]  CC:  Chief Complaint  Patient presents with   Shoulder Pain    X few months. Left side. Patient has had issues with right shoulder for a while and had it looked at by previous PCP and was advised that was bursitis and she was doing exercises that help. Left shoulder is not responding to exercises. She thinks this was after she picked up her grandchild.    Hypertension    Discuss what b/p reading is good for the patient. Has noticed that top number is staying around 140.     HPI Brittney Pierce presents for Left shoulder pain   States that it started approx 2-3 months ago It happened after she picked up the baby who is 27 months old. The first time it happened she got a sling to help. It seemed to help. States that she picked her grandbaby up again and it started hurting again. Has tried her meloxicam and it has not helped much. Has tried bent over arm circles  Blood pressure: states that she wants to know what her blood pressure should be and if she needs to change the medication that she has been on for many years. States that sometimes it is 140s at other doctors offices. Does have a blood pressure cuff at home but has not used it in approx 1 year  Past Medical History:  Diagnosis Date   Disorder of bone and cartilage, unspecified    Dysuria    Esophageal reflux    Lumbago    Osteoarthrosis, unspecified whether generalized or localized, unspecified site    Ulcerative colitis, unspecified    Unspecified essential hypertension     Past Surgical History:  Procedure Laterality Date   CATARACT EXTRACTION, BILATERAL     08/2019 and 09/2019   CESAREAN SECTION     x2   [place] SURGERY  2008    Family History  Problem Relation Age of Onset   Heart attack Father 63   Diabetes Father    Lung cancer Mother      Social History   Socioeconomic History   Marital status: Widowed    Spouse name: Not on file   Number of children: Not on file   Years of education: Not on file   Highest education level: Not on file  Occupational History   Occupation: Retired  Tobacco Use   Smoking status: Former    Packs/day: 1.00    Years: 13.00    Pack years: 13.00    Types: Cigarettes    Quit date: 07/27/1980    Years since quitting: 41.1   Smokeless tobacco: Never  Vaping Use   Vaping Use: Never used  Substance and Sexual Activity   Alcohol use: Yes    Comment: Once a month (maybe)   Drug use: No   Sexual activity: Not Currently  Other Topics Concern   Not on file  Social History Narrative   Regular exercise-yes      Moved here from [place] to be closer to grandchildren      Likes to garden      Does have a living will.  Husband is HPOA.   Would desire life support, no prolonged life support if futile.            Social Determinants of Health  Financial Resource Strain: Not on file  Food Insecurity: Not on file  Transportation Needs: Not on file  Physical Activity: Not on file  Stress: Not on file  Social Connections: Not on file  Intimate Partner Violence: Not on file    Outpatient Medications Prior to Visit  Medication Sig Dispense Refill   BIOTIN PO Take 1 capsule by mouth daily.      Calcium 1200-1000 MG-UNIT CHEW Chew 1 tablet by mouth daily.     cetirizine (ZYRTEC) 10 MG tablet Take 10 mg by mouth daily.     clotrimazole-betamethasone (LOTRISONE) cream Apply 1 application topically 2 (two) times daily. 30 g 0   Cranberry-Vitamin C-Vitamin E 4200-20-3 MG-MG-UNIT CAPS Take by mouth. 1 daily     diclofenac sodium (VOLTAREN) 1 % GEL Apply topically as needed.      esomeprazole (NEXIUM) 40 MG capsule Take 1 capsule (40 mg total) by mouth daily. Take at least 1 hour before a meal. 90 capsule 3   fluticasone (FLONASE) 50 MCG/ACT nasal spray 2 sprays each nostril as directed. 48 g 1    meloxicam (MOBIC) 15 MG tablet TAKE 1 TABLET (15 MG TOTAL) BY MOUTH DAILY. 90 tablet 0   mesalamine (CANASA) 1000 MG suppository PLACE 1 SUPPOSITORY (1,000 MG TOTAL) RECTALLY AT BEDTIME AS NEEDED. 30 suppository 0   metoprolol tartrate (LOPRESSOR) 25 MG tablet TAKE 1/2 TABLET TWICE A DAY 90 tablet 1   Multiple Vitamins-Minerals (MULTIVITAMIN PO) Take 1 tablet by mouth daily.     POTASSIUM GLUCONATE PO Take 1 capsule by mouth daily.     pregabalin (LYRICA) 75 MG capsule Take 75 mg by mouth 2 (two) times daily.     Probiotic Product (PROBIOTIC DAILY PO) Take by mouth.     traMADol (ULTRAM) 50 MG tablet Take 50 mg by mouth daily as needed.     No facility-administered medications prior to visit.    Allergies  Allergen Reactions   Amoxicillin Diarrhea   Cephalexin    Prednisone Other (See Comments)    Sx's of heart attack - elevated BP, HR and chest tightness/pressure.   Bactrim Ds [Sulfamethoxazole-Trimethoprim] Other (See Comments)    Rash,itching,whelps, throat swelling and feels like lump in throat.   Codeine     REACTION: stomach upset   Doxycycline     GI side effects   Erythromycin     REACTION: stomach upset   Sulfamethizole Hives    ROS Review of Systems  Constitutional:  Negative for chills and fever.  Respiratory:  Negative for shortness of breath.   Cardiovascular:  Negative for chest pain.  Musculoskeletal:  Positive for arthralgias. Negative for myalgias.  Neurological:  Positive for light-headedness (when she is standing over the stove for an extended period of time). Negative for dizziness, weakness and numbness.     Objective:    Physical Exam Vitals and nursing note reviewed.  Constitutional:      Appearance: Normal appearance.  Cardiovascular:     Rate and Rhythm: Normal rate and regular rhythm.     Heart sounds: Normal heart sounds.  Pulmonary:     Effort: Pulmonary effort is normal.     Breath sounds: Normal breath sounds.  Abdominal:      General: Bowel sounds are normal.  Musculoskeletal:     Left shoulder: Tenderness and bony tenderness present. No deformity. Decreased range of motion. Normal strength. Normal pulse.       Arms:     Comments: + empty can test +  Hawks-Kennedy test  Skin:    General: Skin is warm.  Neurological:     Mental Status: She is alert.     Sensory: Sensation is intact.     Deep Tendon Reflexes:     Reflex Scores:      Bicep reflexes are 2+ on the right side and 2+ on the left side.    Comments: Bilateral upper extremity strength 5/5    BP 130/82    Pulse 67    Temp (!) 97.2 F (36.2 C)    Resp 12    Ht 5' 4"  (1.626 m)    Wt 188 lb 3 oz (85.4 kg)    SpO2 98%    BMI 32.30 kg/m  Wt Readings from Last 3 Encounters:  09/10/21 188 lb 3 oz (85.4 kg)  04/22/21 190 lb 1 oz (86.2 kg)  12/04/20 194 lb 4 oz (88.1 kg)     Health Maintenance Due  Topic Date Due   FOOT EXAM  Never done   OPHTHALMOLOGY EXAM  Never done   URINE MICROALBUMIN  Never done   MAMMOGRAM  12/27/2020   COVID-19 Vaccine (5 - Booster for Pfizer series) 01/07/2021   COLON CANCER SCREENING ANNUAL FOBT  05/30/2021    There are no preventive care reminders to display for this patient.  Lab Results  Component Value Date   TSH 1.79 03/07/2019   Lab Results  Component Value Date   WBC 8.4 04/22/2021   HGB 13.1 04/22/2021   HCT 39.4 04/22/2021   MCV 86.2 04/22/2021   PLT 270.0 04/22/2021   Lab Results  Component Value Date   NA 134 (L) 04/22/2021   K 4.1 04/22/2021   CO2 27 04/22/2021   GLUCOSE 128 (H) 04/22/2021   BUN 12 04/22/2021   CREATININE 0.96 04/22/2021   BILITOT 0.5 04/22/2021   ALKPHOS 101 04/22/2021   AST 36 04/22/2021   ALT 27 04/22/2021   PROT 6.9 04/22/2021   ALBUMIN 3.9 04/22/2021   CALCIUM 9.7 04/22/2021   ANIONGAP 10 03/22/2019   GFR 59.21 (L) 04/22/2021   Lab Results  Component Value Date   CHOL 151 04/22/2021   Lab Results  Component Value Date   HDL 44.90 04/22/2021   Lab Results   Component Value Date   LDLCALC 73 04/22/2021   Lab Results  Component Value Date   TRIG 169.0 (H) 04/22/2021   Lab Results  Component Value Date   CHOLHDL 3 04/22/2021   Lab Results  Component Value Date   HGBA1C 6.7 (H) 04/22/2021      Assessment & Plan:   Problem List Items Addressed This Visit       Cardiovascular and Mediastinum   Essential hypertension    Patient blood pressure within goal in office today.  Patient does not check her blood pressure at home recommended she start checking her blood pressure especially when she feels lightheaded.  Do feel the lightheadedness is to deal with patient standing and when needed for extended period time heat from the stove and possible locking of the knees.  Patient states this will abate once she starts moving and walks with the stove.  Continue medications as prescribed      Relevant Medications   metoprolol tartrate (LOPRESSOR) 25 MG tablet   rosuvastatin (CRESTOR) 5 MG tablet     Endocrine   Type 2 diabetes mellitus with hyperglycemia, without long-term current use of insulin ([place])    Currently controlled utilizing diet.  Patient's A1c  was good in the office 6.5%.  Did have a detailed discussion about patient's ASCVD risk.  See below: The 10-year ASCVD risk score (Arnett DK, et al., 2019) is: 27.6%   Values used to calculate the score:     Age: 75 years     Sex: Female     Is Non-Hispanic African American: No     Diabetic: Yes     Tobacco smoker: No     Systolic Blood Pressure: 229 mmHg     Is BP treated: Yes     HDL Cholesterol: 44.9 mg/dL     Total Cholesterol: 151 mg/dL  Given patient's high ASCVD risk percentage we did have a joint discussion and agreed to put patient on a lipid-lowering agent we will start out with rosuvastatin 5 mg.  Follow-up in 3 months recheck lipids and liver function.      Relevant Medications   rosuvastatin (CRESTOR) 5 MG tablet   Other Relevant Orders   POCT glycosylated hemoglobin  (Hb A1C) (Completed)   CBC   Lipid panel   Comprehensive metabolic panel     Musculoskeletal and Integument   Impingement syndrome of left shoulder    Given assessment office patient likely has impingement of left shoulder.  She continue over-the-counter medication and her prescribed medications.  She can take her tramadol as needed if she uses it very infrequently.  Pending x-rays of shoulder.  Did give some exercises to try for shoulder impingement.  This does not help we can consider getting her in with sports medicine doctor in office patient in agreement with this plan of care.      Relevant Medications   meloxicam (MOBIC) 15 MG tablet     Other   Medication refill - Primary   Relevant Medications   esomeprazole (NEXIUM) 40 MG capsule   meloxicam (MOBIC) 15 MG tablet   metoprolol tartrate (LOPRESSOR) 25 MG tablet   Acute pain of left shoulder    After lifting her granddaughter.  This was getting better and reaggravated with same motions.  Patient states that she lives with her granddaughter and is hard to not pick her up when she requests that.  Recommend continuing meloxicam and other prescribed medications for pain try to restrict use of arm in regards to lifting up grandchild.  Pending x-ray results      Relevant Orders   DG Shoulder Left    Meds ordered this encounter  Medications   esomeprazole (NEXIUM) 40 MG capsule    Sig: Take 1 capsule (40 mg total) by mouth daily. Take at least 1 hour before a meal.    Dispense:  90 capsule    Refill:  1   meloxicam (MOBIC) 15 MG tablet    Sig: Take 1 tablet (15 mg total) by mouth daily.    Dispense:  90 tablet    Refill:  0   metoprolol tartrate (LOPRESSOR) 25 MG tablet    Sig: Take 0.5 tablets (12.5 mg total) by mouth 2 (two) times daily.    Dispense:  90 tablet    Refill:  1   rosuvastatin (CRESTOR) 5 MG tablet    Sig: Take 1 tablet (5 mg total) by mouth daily.    Dispense:  90 tablet    Refill:  1    Order Specific  Question:   Supervising Provider    Answer:   Loura Pardon A [[id number]]    Follow-up: Return in about 3 months (around 12/08/2021) for CPE and recheck.  This visit occurred during the SARS-CoV-2 public health emergency.  Safety protocols were in place, including screening questions prior to the visit, additional usage of staff PPE, and extensive cleaning of exam room while observing appropriate contact time as indicated for disinfecting solutions.   Romilda Garret, NP

## 2021-09-10 NOTE — Assessment & Plan Note (Signed)
Given assessment office patient likely has impingement of left shoulder.  She continue over-the-counter medication and her prescribed medications.  She can take her tramadol as needed if she uses it very infrequently.  Pending x-rays of shoulder.  Did give some exercises to try for shoulder impingement.  This does not help we can consider getting her in with sports medicine doctor in office patient in agreement with this plan of care.

## 2021-09-11 ENCOUNTER — Encounter: Payer: Self-pay | Admitting: Nurse Practitioner

## 2021-09-11 LAB — COMPREHENSIVE METABOLIC PANEL
ALT: 32 U/L (ref 0–35)
AST: 48 U/L — ABNORMAL HIGH (ref 0–37)
Albumin: 4.3 g/dL (ref 3.5–5.2)
Alkaline Phosphatase: 71 U/L (ref 39–117)
BUN: 15 mg/dL (ref 6–23)
CO2: 30 mEq/L (ref 19–32)
Calcium: 9.7 mg/dL (ref 8.4–10.5)
Chloride: 98 mEq/L (ref 96–112)
Creatinine, Ser: 1.04 mg/dL (ref 0.40–1.20)
GFR: 53.64 mL/min — ABNORMAL LOW (ref >60.00)
Glucose, Bld: 104 mg/dL — ABNORMAL HIGH (ref 70–99)
Potassium: 4.3 mEq/L (ref 3.5–5.1)
Sodium: 134 mEq/L — ABNORMAL LOW (ref 135–145)
Total Bilirubin: 0.6 mg/dL (ref 0.2–1.2)
Total Protein: 7.4 g/dL (ref 6.0–8.3)

## 2021-09-11 LAB — CBC
HCT: 40.8 % (ref 36.0–46.0)
Hemoglobin: 13.2 g/dL (ref 12.0–15.0)
MCHC: 32.4 g/dL (ref 30.0–36.0)
MCV: 86.3 fl (ref 78.0–100.0)
Platelets: 258 10*3/uL (ref 150.0–400.0)
RBC: 4.72 Mil/uL (ref 3.87–5.11)
RDW: 15.2 % (ref 11.5–15.5)
WBC: 6.4 10*3/uL (ref 4.0–10.5)

## 2021-09-11 LAB — LIPID PANEL
Cholesterol: 153 mg/dL (ref 0–200)
HDL: 50.8 mg/dL (ref >39.00)
LDL Cholesterol: 84 mg/dL (ref 0–99)
NonHDL: 102.06
Total CHOL/HDL Ratio: 3
Triglycerides: 90 mg/dL (ref 0.0–149.0)
VLDL: 18 mg/dL (ref 0.0–40.0)

## 2021-10-17 ENCOUNTER — Encounter: Payer: Self-pay | Admitting: Internal Medicine

## 2021-10-30 ENCOUNTER — Other Ambulatory Visit: Payer: Self-pay | Admitting: Nurse Practitioner

## 2021-10-30 DIAGNOSIS — Z1231 Encounter for screening mammogram for malignant neoplasm of breast: Secondary | ICD-10-CM

## 2021-11-06 ENCOUNTER — Encounter: Payer: Self-pay | Admitting: Internal Medicine

## 2021-11-06 ENCOUNTER — Ambulatory Visit: Admitting: Family

## 2021-11-06 ENCOUNTER — Ambulatory Visit (INDEPENDENT_AMBULATORY_CARE_PROVIDER_SITE_OTHER): Payer: Medicare PPO | Admitting: Internal Medicine

## 2021-11-06 ENCOUNTER — Ambulatory Visit
Admission: RE | Admit: 2021-11-06 | Discharge: 2021-11-06 | Disposition: A | Discharge status: Home / Self Care | Payer: Medicare PPO | Source: Ambulatory Visit | Attending: Nurse Practitioner | Admitting: Nurse Practitioner

## 2021-11-06 VITALS — BP 130/88 | HR 64 | Temp 96.8°F | Resp 14 | Wt 193.1 lb

## 2021-11-06 DIAGNOSIS — Z1231 Encounter for screening mammogram for malignant neoplasm of breast: Secondary | ICD-10-CM

## 2021-11-06 DIAGNOSIS — R300 Dysuria: Secondary | ICD-10-CM

## 2021-11-06 DIAGNOSIS — N3000 Acute cystitis without hematuria: Secondary | ICD-10-CM

## 2021-11-06 LAB — POCT URINALYSIS DIPSTICK
Bilirubin, UA: NEGATIVE
Blood, UA: NEGATIVE
Glucose, UA: NEGATIVE
Ketones, UA: NEGATIVE
Nitrite, UA: NEGATIVE
Protein, UA: NEGATIVE
Spec Grav, UA: 1.01 (ref 1.010–1.025)
Urobilinogen, UA: 0.2 E.U./dL
pH, UA: 6 (ref 5.0–8.0)

## 2021-11-06 MED ORDER — CIPROFLOXACIN HCL 250 MG PO TABS: 250.0000 mg | ORAL_TABLET | Freq: Two times a day (BID) | ORAL | 1 refills | Status: AC

## 2021-11-06 NOTE — Progress Notes (Signed)
? ?Subjective:  ? ? Patient ID: Brittney Pierce, female    DOB: 1951-03-07, 71 y.o.   MRN: [id number] ? ?HPI ?Here due to urinary symptoms ? ?Having burning dysuria--started yesterday ?Some pain around right flank as well----though has some chronic back issues. This burns also--and the pain is more than usual ?4 days ago--had loose bowels. This may have caused the problems ? ?No fever ?No hematuria ? ?No sexual relationship ?Uses OTC vaginal ointment at times for dryness ? ?Current Outpatient Medications on File Prior to Visit  ?Medication Sig Dispense Refill  ? BIOTIN PO Take 1 capsule by mouth daily.     ? Calcium 1200-1000 MG-UNIT CHEW Chew 1 tablet by mouth daily.    ? cetirizine (ZYRTEC) 10 MG tablet Take 10 mg by mouth daily.    ? clotrimazole-betamethasone (LOTRISONE) cream Apply 1 application topically 2 (two) times daily. 30 g 0  ? Cranberry-Vitamin C-Vitamin E 4200-20-3 MG-MG-UNIT CAPS Take by mouth. 1 daily    ? diclofenac sodium (VOLTAREN) 1 % GEL Apply topically as needed.     ? esomeprazole (NEXIUM) 40 MG capsule Take 1 capsule (40 mg total) by mouth daily. Take at least 1 hour before a meal. 90 capsule 1  ? fluticasone (FLONASE) 50 MCG/ACT nasal spray 2 sprays each nostril as directed. 48 g 1  ? meloxicam (MOBIC) 15 MG tablet Take 1 tablet (15 mg total) by mouth daily. 90 tablet 0  ? metoprolol tartrate (LOPRESSOR) 25 MG tablet Take 0.5 tablets (12.5 mg total) by mouth 2 (two) times daily. 90 tablet 1  ? Multiple Vitamins-Minerals (MULTIVITAMIN PO) Take 1 tablet by mouth daily.    ? POTASSIUM GLUCONATE PO Take 1 capsule by mouth daily.    ? pregabalin (LYRICA) 75 MG capsule Take 75 mg by mouth 2 (two) times daily.    ? Probiotic Product (PROBIOTIC DAILY PO) Take by mouth.    ? rosuvastatin (CRESTOR) 5 MG tablet Take 1 tablet (5 mg total) by mouth daily. 90 tablet 1  ? traMADol (ULTRAM) 50 MG tablet Take 50 mg by mouth daily as needed.    ? mesalamine (CANASA) 1000 MG suppository PLACE 1  SUPPOSITORY (1,000 MG TOTAL) RECTALLY AT BEDTIME AS NEEDED. (Patient not taking: Reported on 11/06/2021) 30 suppository 0  ? ?No current facility-administered medications on file prior to visit.  ? ? ?Allergies  ?Allergen Reactions  ? Amoxicillin Diarrhea  ? Cephalexin   ? Prednisone Other (See Comments)  ?  Sx's of heart attack - elevated BP, HR and chest tightness/pressure.  ? Bactrim Ds [Sulfamethoxazole-Trimethoprim] Other (See Comments)  ?  Rash,itching,whelps, throat swelling and feels like lump in throat.  ? Codeine   ?  REACTION: stomach upset  ? Doxycycline   ?  GI side effects  ? Erythromycin   ?  REACTION: stomach upset  ? Sulfamethizole Hives  ? ? ?Past Medical History:  ?Diagnosis Date  ? Disorder of bone and cartilage, unspecified   ? Dysuria   ? Esophageal reflux   ? Lumbago   ? Osteoarthrosis, unspecified whether generalized or localized, unspecified site   ? Ulcerative colitis, unspecified   ? Unspecified essential hypertension   ? ? ?Past Surgical History:  ?Procedure Laterality Date  ? CATARACT EXTRACTION, BILATERAL    ? 08/2019 and 09/2019  ? CESAREAN SECTION    ? x2  ? CHOLECYSTECTOMY    ? [place] SURGERY  2008  ? ? ?Family History  ?Problem Relation Age of Onset  ?  Heart attack Father 29  ? Diabetes Father   ? Lung cancer Mother   ? ? ?Social History  ? ?Socioeconomic History  ? Marital status: Widowed  ?  Spouse name: Not on file  ? Number of children: Not on file  ? Years of education: Not on file  ? Highest education level: Not on file  ?Occupational History  ? Occupation: Retired  ?Tobacco Use  ? Smoking status: Former  ?  Packs/day: 1.00  ?  Years: 13.00  ?  Pack years: 13.00  ?  Types: Cigarettes  ?  Quit date: 07/27/1980  ?  Years since quitting: 41.3  ? Smokeless tobacco: Never  ?Vaping Use  ? Vaping Use: Never used  ?Substance and Sexual Activity  ? Alcohol use: Yes  ?  Comment: Once a month (maybe)  ? Drug use: No  ? Sexual activity: Not Currently  ?Other Topics Concern  ? Not on file   ?Social History Narrative  ? Regular exercise-yes  ?   ? [place] [facility] to be closer to grandchildrenLikes to garden  ? Does have a living will-but will need to update  ? Baird Kay (daughter) or sons, are health care POA  ? Marland KitchenWould desire life support, no prolonged life support if futile.  ? ?Social Determinants of Health  ? ?Financial Resource Strain: Not on file  ?Food Insecurity: Not on file  ?Transportation Needs: Not on file  ?Physical Activity: Not on file  ?Stress: Not on file  ?Social Connections: Not on file  ?Intimate Partner Violence: Not on file  ? ?Review of Systems ?Stomach is upset---but no vomiting ?Appetite is off ?   ?Objective:  ? Physical Exam ?Constitutional:   ?   Appearance: Normal appearance.  ?Abdominal:  ?   Palpations: Abdomen is soft.  ?   Tenderness: There is no abdominal tenderness. There is no right CVA tenderness or left CVA tenderness.  ?Neurological:  ?   Mental Status: She is alert.  ?  ? ? ? ? ?   ?Assessment & Plan:  ? ?

## 2021-11-06 NOTE — Assessment & Plan Note (Addendum)
Typical symptoms---despite daily cranberry pills ?Urinalysis shows 1+ leuks ?Will give cipro 250 bid x 3 days (due to her allergies and a resistant organism on culture 5/21) ?Will send culture as well ? ?If recurs, would consider vaginal estrogen twice a week ?

## 2021-11-07 LAB — URINE CULTURE
MICRO NUMBER:: 13259842
SPECIMEN QUALITY:: ADEQUATE

## 2021-11-25 DIAGNOSIS — M419 Scoliosis, unspecified: Secondary | ICD-10-CM | POA: Diagnosis not present

## 2021-11-25 DIAGNOSIS — M5136 Other intervertebral disc degeneration, lumbar region: Secondary | ICD-10-CM | POA: Diagnosis not present

## 2021-12-03 ENCOUNTER — Ambulatory Visit (INDEPENDENT_AMBULATORY_CARE_PROVIDER_SITE_OTHER): Payer: Medicare PPO | Admitting: Internal Medicine

## 2021-12-03 ENCOUNTER — Encounter: Payer: Self-pay | Admitting: Internal Medicine

## 2021-12-03 VITALS — BP 122/80 | HR 74 | Ht 64.0 in | Wt 195.0 lb

## 2021-12-03 DIAGNOSIS — M810 Age-related osteoporosis without current pathological fracture: Secondary | ICD-10-CM

## 2021-12-03 MED ORDER — ALENDRONATE SODIUM 70 MG PO TABS: 70.0000 mg | ORAL_TABLET | ORAL | 2 refills | Status: DC

## 2021-12-03 NOTE — Progress Notes (Signed)
? ?Name: Brittney Pierce [facility]  ?MRN/ DOB: [id number], June 14, 1951    ?Age/ Sex: 71 y.o., female   ? ? ?PCP: Michela Pitcher, NP   ?Reason for Endocrinology Evaluation: Osteoporosis   ?   ?Initial Endocrinology Clinic Visit: 03/07/2019  ? ? ?PATIENT IDENTIFIER: [name] is a 71 y.o., female with a past medical history of HTN, Ulcerative Colitis ( in remission for the past 8 yr), scoliosis and GERD . She has followed with [facility] since 03/07/2019 for consultative assistance with management of her Osteoporosis.  ? ? ? ? ?HISTORICAL SUMMARY:  ?The pt has been diagnosed with osteoporosis in 2020 with a right neck femur T-score of -2.6 . Due to pending surgery for scoliosis it was recommended by the surgeon to start her on Forteo which was done in 03/2019.  ? She is intolerant to calcium citrate due to severe constipation which exacerbated her proctitis.  ? ?SUBJECTIVE:  ? ? ?Today (12/03/2021):  Ms. Byus is here for a follow up on osteoporosis.  ? ?She follows with ortho for scoliosis, on Lyrica  ?She is on diclofenac for arthritis pain  ? ?She continues with aches and pains  ? ? ?She continues on calcium and vitamin D chewables, intolerant to calcium citrate due to constipation which leads to proctitis  ? ? ?Denies constipation or diarrhea  ?GERD symptoms controlled  ?She is concerned about reclast infusion , her friend in on cancer therapy and received infusions which apparently the friend has side effects to and the pt has a negative association with infusions.  ? ? ? ? ?HISTORY:  ?Past Medical History:  ?Past Medical History:  ?Diagnosis Date  ? Disorder of bone and cartilage, unspecified   ? Dysuria   ? Esophageal reflux   ? Lumbago   ? Osteoarthrosis, unspecified whether generalized or localized, unspecified site   ? Ulcerative colitis, unspecified   ? Unspecified essential hypertension   ? ?Past Surgical History:  ?Past Surgical History:  ?Procedure Laterality Date  ?  CATARACT EXTRACTION, BILATERAL    ? 08/2019 and 09/2019  ? CESAREAN SECTION    ? x2  ? CHOLECYSTECTOMY    ? [place] SURGERY  2008  ? ?Social History:  reports that she quit smoking about 41 years ago. Her smoking use included cigarettes. She has a 13.00 pack-year smoking history. She has never used smokeless tobacco. She reports current alcohol use. She reports that she does not use drugs. ?Family History:  ?Family History  ?Problem Relation Age of Onset  ? Heart attack Father 60  ? Diabetes Father   ? Lung cancer Mother   ? ? ? ?HOME MEDICATIONS: ?Allergies as of 12/03/2021   ? ?   Reactions  ? Amoxicillin Diarrhea  ? Cephalexin   ? Prednisone Other (See Comments)  ? Sx's of heart attack - elevated BP, HR and chest tightness/pressure.  ? Bactrim Ds [sulfamethoxazole-trimethoprim] Other (See Comments)  ? Rash,itching,whelps, throat swelling and feels like lump in throat.  ? Codeine   ? REACTION: stomach upset  ? Doxycycline   ? GI side effects  ? Erythromycin   ? REACTION: stomach upset  ? Sulfamethizole Hives  ? ?  ? ?  ?Medication List  ?  ? ?  ? Accurate as of Dec 03, 2021 11:07 AM. If you have any questions, ask your nurse or doctor.  ?  ?  ? ?  ? ?alendronate 70 MG tablet ?Commonly known as: FOSAMAX ?Take  1 tablet (70 mg total) by mouth every 7 (seven) days. Take with a full glass of water on an empty stomach. ?Started by: Dorita Sciara, MD ?  ?BIOTIN PO ?Take 1 capsule by mouth daily. ?  ?Calcium 1200-1000 MG-UNIT Chew ?Chew 1 tablet by mouth daily. ?  ?cetirizine 10 MG tablet ?Commonly known as: ZYRTEC ?Take 10 mg by mouth daily. ?  ?clotrimazole-betamethasone cream ?Commonly known as: LOTRISONE ?Apply 1 application topically 2 (two) times daily. ?  ?Cranberry-Vitamin C-Vitamin E 4200-20-3 MG-MG-UNIT Caps ?Take by mouth. 1 daily ?  ?diclofenac sodium 1 % Gel ?Commonly known as: VOLTAREN ?Apply topically as needed. ?  ?esomeprazole 40 MG capsule ?Commonly known as: NexIUM ?Take 1 capsule (40 mg total)  by mouth daily. Take at least 1 hour before a meal. ?  ?fluticasone 50 MCG/ACT nasal spray ?Commonly known as: FLONASE ?2 sprays each nostril as directed. ?  ?meloxicam 15 MG tablet ?Commonly known as: MOBIC ?Take 1 tablet (15 mg total) by mouth daily. ?  ?mesalamine 1000 MG suppository ?Commonly known as: CANASA ?PLACE 1 SUPPOSITORY (1,000 MG TOTAL) RECTALLY AT BEDTIME AS NEEDED. ?  ?metoprolol tartrate 25 MG tablet ?Commonly known as: LOPRESSOR ?Take 0.5 tablets (12.5 mg total) by mouth 2 (two) times daily. ?  ?MULTIVITAMIN PO ?Take 1 tablet by mouth daily. ?  ?POTASSIUM GLUCONATE PO ?Take 1 capsule by mouth daily. ?  ?pregabalin 75 MG capsule ?Commonly known as: LYRICA ?Take 75 mg by mouth 2 (two) times daily. ?  ?PROBIOTIC DAILY PO ?Take by mouth. ?  ?rosuvastatin 5 MG tablet ?Commonly known as: Crestor ?Take 1 tablet (5 mg total) by mouth daily. ?  ?traMADol 50 MG tablet ?Commonly known as: ULTRAM ?Take 50 mg by mouth daily as needed. ?  ? ?  ? ? ? ? ?OBJECTIVE:  ? ?PHYSICAL EXAM: ?VS: BP 122/80 (BP Location: Left Arm, Patient Position: Sitting, Cuff Size: Large)   Pulse 74   Ht 5' 4"  (1.626 m)   Wt 195 lb (88.5 kg)   SpO2 96%   BMI 33.47 kg/m?   ? ?EXAM: ?General: Pt appears well and is in NAD  ?Lungs: Clear with good BS bilat with no rales, rhonchi, or wheezes  ?Heart: Auscultation: RRR.  ?Abdomen: Normoactive bowel sounds, soft, nontender, without masses or organomegaly palpable  ?Extremities:  ?BL LE: No pretibial edema normal ROM and strength.  ?Mental Status: Judgment, insight: Intact ?Mood and affect: No depression, anxiety, or agitation  ? ? ? ?DATA REVIEWED: ? Latest Reference Range & Units 09/10/21 15:33  ?Sodium 135 - 145 mEq/L 134 (L)  ?Potassium 3.5 - 5.1 mEq/L 4.3  ?Chloride 96 - 112 mEq/L 98  ?CO2 19 - 32 mEq/L 30  ?Glucose 70 - 99 mg/dL 104 (H)  ?BUN 6 - 23 mg/dL 15  ?Creatinine 0.40 - 1.20 mg/dL 1.04  ?Calcium 8.4 - 10.5 mg/dL 9.7  ?Alkaline Phosphatase 39 - 117 U/L 71  ?Albumin 3.5 - 5.2  g/dL 4.3  ?AST 0 - 37 U/L 48 (H)  ?ALT 0 - 35 U/L 32  ?Total Protein 6.0 - 8.3 g/dL 7.4  ?Total Bilirubin 0.2 - 1.2 mg/dL 0.6  ?GFR >60.00 mL/min 53.64 (L)  ? ? ? ? ? ?DXA 05/16/2021 ?The BMD measured at Femur Neck Right is 0.732 g/cm2 with a T-score ?of -2.2. This patient is considered osteopenic/low bone mass ?according to [facility] [facility]) criteria. ?  ?  ?Site Region Measured Date Measured Age YA BMD Significant CHANGE ?  T-score ?DualFemur Neck Right 05/16/2021 72.3 -2.2 0.732 g/cm2 * ?DualFemur Neck Right 12/28/2018 69.9 -2.4 0.682 g/cm2 ?  ?DualFemur Total Mean 05/16/2021 72.3 -1.7 0.793 g/cm2 * ?DualFemur Total Mean 12/28/2018 69.9 -2.1 0.747 g/cm2 ?  ?Left Forearm Radius 33% 05/16/2021 72.3 -1.4 0.767 g/cm2 ?Left Forearm Radius 33% 12/28/2018 69.9 -1.0 0.801 g/cm2 ?  ? ?ASSESSMENT / PLAN / RECOMMENDATIONS:  ? ?Osteoporosis:  ? ?-She completed Forteo injections September 2022 ?-DEXA scan October 2022 showed significant improvement in BMD at the hips without improvement at the distal forearm.  Given history of ulcerative colitis and GERD I have recommended Reclast annually but she has negative association with "infusion " due to a personal experience of a friend and she has opted to try oral bisphosphonates  ?- She understands given her history of UC , she may develop GI side effects but she would like to give this a try  ?- She canceled her appointment for reclast after she read the side effects  ?- She was unable to tolerate calcium citrate ( recommended due to better absorption profile in the setting of chronic PPI use) , she can continue her current chewable Ca/vit D ?- ? ?Medications  ?Start Alendronate 70 mg weekly  ?Calcium/ vitamin D ? ? ? ? ? ?Follow-up in 6 months  ? ? ? ?Signed electronically by: ?Abby Nena Jordan, MD ? ?[facility]  ?[facility] ?[address] ?[place], [place] [address] ?Phone: [phone] ?FAX: [phone]   ? ? ? ? ?CC: ?Michela Pitcher, NP ?[place] ?[place] [address] ?Phone: [phone]  ?Fax: [phone] ? ? ?Return to Endocrinology clinic as below: ?Future Appointments  ?Date Time Provider [place]

## 2021-12-03 NOTE — Patient Instructions (Addendum)
-   Continue Calcium / vitamin D ?- Start Fosamax 70 mg ONCE weekly  ? ? ?

## 2021-12-08 ENCOUNTER — Ambulatory Visit (INDEPENDENT_AMBULATORY_CARE_PROVIDER_SITE_OTHER): Payer: Medicare PPO | Admitting: Nurse Practitioner

## 2021-12-08 ENCOUNTER — Encounter: Payer: Self-pay | Admitting: Nurse Practitioner

## 2021-12-08 VITALS — BP 126/70 | HR 63 | Temp 96.4°F | Resp 14 | Ht 64.0 in | Wt 193.3 lb

## 2021-12-08 DIAGNOSIS — Z1211 Encounter for screening for malignant neoplasm of colon: Secondary | ICD-10-CM

## 2021-12-08 DIAGNOSIS — Z23 Encounter for immunization: Secondary | ICD-10-CM | POA: Diagnosis not present

## 2021-12-08 DIAGNOSIS — I10 Essential (primary) hypertension: Secondary | ICD-10-CM | POA: Diagnosis not present

## 2021-12-08 DIAGNOSIS — M810 Age-related osteoporosis without current pathological fracture: Secondary | ICD-10-CM

## 2021-12-08 DIAGNOSIS — E1165 Type 2 diabetes mellitus with hyperglycemia: Secondary | ICD-10-CM

## 2021-12-08 DIAGNOSIS — Z0000 Encounter for general adult medical examination without abnormal findings: Secondary | ICD-10-CM

## 2021-12-08 LAB — LIPID PANEL
Cholesterol: 135 mg/dL (ref 0–200)
HDL: 58.8 mg/dL (ref >39.00)
LDL Cholesterol: 37 mg/dL (ref 0–99)
NonHDL: 76.23
Total CHOL/HDL Ratio: 2
Triglycerides: 195 mg/dL — ABNORMAL HIGH (ref 0.0–149.0)
VLDL: 39 mg/dL (ref 0.0–40.0)

## 2021-12-08 LAB — COMPREHENSIVE METABOLIC PANEL
ALT: 18 U/L (ref 0–35)
AST: 25 U/L (ref 0–37)
Albumin: 4.2 g/dL (ref 3.5–5.2)
Alkaline Phosphatase: 66 U/L (ref 39–117)
BUN: 10 mg/dL (ref 6–23)
CO2: 28 mEq/L (ref 19–32)
Calcium: 9.7 mg/dL (ref 8.4–10.5)
Chloride: 100 mEq/L (ref 96–112)
Creatinine, Ser: 0.96 mg/dL (ref 0.40–1.20)
GFR: 58.95 mL/min — ABNORMAL LOW (ref >60.00)
Glucose, Bld: 107 mg/dL — ABNORMAL HIGH (ref 70–99)
Potassium: 4.8 mEq/L (ref 3.5–5.1)
Sodium: 139 mEq/L (ref 135–145)
Total Bilirubin: 0.5 mg/dL (ref 0.2–1.2)
Total Protein: 6.8 g/dL (ref 6.0–8.3)

## 2021-12-08 NOTE — Assessment & Plan Note (Signed)
Patient currently managed by Dr. Kelton Pillar following up with endocrinology as recommended.  Take medication as prescribed ?

## 2021-12-08 NOTE — Assessment & Plan Note (Signed)
According to CDC patient has had pneumococcal 23 and Prevnar 13 but has been 5 years since last vaccination I do recommend PCV 20.  Administered in office today ?

## 2021-12-08 NOTE — Assessment & Plan Note (Signed)
Discussed age-appropriate immunizations and screening exams.  Patient does not want to undergo colonoscopy.  Patient has been follow-up in the past we will repeat, pending result ?

## 2021-12-08 NOTE — Patient Instructions (Signed)
Nice to see you today ?We will update your pneumonia vaccine today ?I want to see you in 6 months, sooner if you need me ? ?

## 2021-12-08 NOTE — Assessment & Plan Note (Signed)
Currently on metoprolol twice daily.  Shows a normal skin taking medication as prescribed ?

## 2021-12-08 NOTE — Progress Notes (Signed)
? ?Established Patient Office Visit ? ?Subjective   ?Patient ID: Brittney Pierce, female    DOB: 12/02/1950  Age: 71 y.o. MRN: [id number] ? ?Chief Complaint  ?Patient presents with  ? Annual Exam  ? ? ?HPI ?for complete physical and follow up of chronic conditions. ? ?Immunizations: ?-Tetanus:2021 ?-Influenza: out of season  ?-Covid-19:up to date ?-Shingles: up to date ?-Pneumonia: need pna vaccine ? ?-HPV: aged out ? ?Diet: Fair diet. States that she is grazing more. States that she will cook a meal a day. 2 meals with snack. Water and coke zero (2-3 12 oz a day) ?Exercise: No regular exercise. Because of her back. States that she will do resitance type bands ? ?Eye exam: Completes annually. Cataracts in the past few years  ?Dental exam: Completes semi-annually. Dentures. Last week  ? ?Pap Smear:  aged out ?Mammogram: Completed in 10/2021 ?Colonoscopy:   Ifob in 2021 negative  ?Dexa: Completed in 2022 ? ? ?Lung Cancer Screening: NA ? ?Sleep: Goes to bed around 11pm and gets up around 730-830. Feels rested.  No snore ? ?Derm yearly ?Endocrine: Dr. Lowella Dell ?[facility] ? ? ? ?Review of Systems  ?Constitutional:  Negative for chills and fever.  ?Respiratory:  Positive for shortness of breath (DOE). Negative for cough.   ?Cardiovascular:  Negative for chest pain.  ?Gastrointestinal:  Negative for diarrhea, nausea and vomiting.  ?Genitourinary:  Negative for dysuria and hematuria.  ?Neurological:  Positive for tingling (legs on lyrica). Negative for headaches.  ?Psychiatric/Behavioral:  Negative for hallucinations and suicidal ideas.   ? ?  ?Objective:  ?  ? ?BP 126/70   Pulse 63   Temp (!) 96.4 ?F (35.8 ?C)   Resp 14   Ht 5' 4"  (1.626 m)   Wt 193 lb 5 oz (87.7 kg)   SpO2 98%   BMI 33.18 kg/m?  ?BP Readings from Last 3 Encounters:  ?12/08/21 126/70  ?12/03/21 122/80  ?11/06/21 130/88  ? ?Wt Readings from Last 3 Encounters:  ?12/08/21 193 lb 5 oz (87.7 kg)  ?12/03/21 195 lb (88.5  kg)  ?11/06/21 193 lb 2 oz (87.6 kg)  ? ?  ? ?Physical Exam ?Vitals and nursing note reviewed.  ?Constitutional:   ?   Appearance: Normal appearance. She is obese.  ?HENT:  ?   Right Ear: Tympanic membrane, ear canal and external ear normal.  ?   Left Ear: Tympanic membrane, ear canal and external ear normal.  ?   Mouth/Throat:  ?   Mouth: Mucous membranes are moist.  ?   Pharynx: Oropharynx is clear.  ?Eyes:  ?   Extraocular Movements: Extraocular movements intact.  ?   Pupils: Pupils are equal, round, and reactive to light.  ?Cardiovascular:  ?   Pulses: Normal pulses.  ?   Heart sounds: Normal heart sounds.  ?Pulmonary:  ?   Effort: Pulmonary effort is normal.  ?   Breath sounds: Normal breath sounds.  ?Abdominal:  ?   General: Bowel sounds are normal. There is no distension.  ?   Palpations: There is no mass.  ?   Tenderness: There is no abdominal tenderness.  ?   Hernia: No hernia is present.  ?Musculoskeletal:  ?   Right lower leg: No edema.  ?   Left lower leg: No edema.  ?Skin: ?   General: Skin is warm.  ?Neurological:  ?   General: No focal deficit present.  ?   Mental Status: She is alert.  ?  Deep Tendon Reflexes:  ?   Reflex Scores: ?     Bicep reflexes are 2+ on the right side and 2+ on the left side. ?     Patellar reflexes are 2+ on the right side and 2+ on the left side. ?   Comments: Bilateral upper and lower extremity strength 5/5  ?Psychiatric:     ?   Mood and Affect: Mood normal.     ?   Behavior: Behavior normal.     ?   Thought Content: Thought content normal.     ?   Judgment: Judgment normal.  ? ? ? ?No results found for any visits on 12/08/21. ? ? ? ?The 10-year ASCVD risk score (Arnett DK, et al., 2019) is: 26% ? ?  ?Assessment & Plan:  ? ?Problem List Items Addressed This Visit   ? ?  ? Cardiovascular and Mediastinum  ? Essential hypertension  ?  Currently on metoprolol twice daily.  Shows a normal skin taking medication as prescribed ? ?  ?  ? Relevant Orders  ? Comprehensive metabolic  panel  ?  ? Endocrine  ? Type 2 diabetes mellitus with hyperglycemia, without long-term current use of insulin ([place])  ?  Patient currently managed by Dr. Kelton Pillar following up with endocrinology as recommended.  Take medication as prescribed ? ?  ?  ? Relevant Orders  ? Microalbumin/Creatinine Ratio, Urine  ? Lipid panel  ?  ? Musculoskeletal and Integument  ? Age-related osteoporosis without current pathological fracture  ?  Currently placed on alendronate 70 mg daily.  This was started by her endocrinologist.  Continue taking medication as prescribed follow-up with endocrinology as recommended. ? ?  ?  ?  ? Other  ? Preventative health care - Primary  ?  Discussed age-appropriate immunizations and screening exams.  Patient does not want to undergo colonoscopy.  Patient has been follow-up in the past we will repeat, pending result ? ?  ?  ? Need for Streptococcus pneumoniae vaccination  ?  According to CDC patient has had pneumococcal 23 and Prevnar 13 but has been 5 years since last vaccination I do recommend PCV 20.  Administered in office today ? ?  ?  ? Relevant Orders  ? Pneumococcal conjugate vaccine 20-valent (Prevnar 20) (Completed)  ? ?Other Visit Diagnoses   ? ? Screening for colon cancer      ? Relevant Orders  ? Fecal Globin By Immunochemistry  ? ?  ? ? ?Return in about 6 months (around 06/10/2022) for recheck.  ? ? ?Romilda Garret, NP ? ?

## 2021-12-08 NOTE — Assessment & Plan Note (Signed)
Currently placed on alendronate 70 mg daily.  This was started by her endocrinologist.  Continue taking medication as prescribed follow-up with endocrinology as recommended. ?

## 2021-12-09 LAB — MICROALBUMIN / CREATININE URINE RATIO
Creatinine,U: 106.9 mg/dL
Microalb Creat Ratio: 0.7 mg/g (ref 0.0–30.0)
Microalb, Ur: <0.7 mg/dL (ref 0.0–1.9)

## 2022-01-20 ENCOUNTER — Telehealth: Payer: Self-pay

## 2022-01-20 NOTE — Telephone Encounter (Signed)
Patient said she does have it and she said she is planning on completing

## 2022-02-09 ENCOUNTER — Ambulatory Visit (INDEPENDENT_AMBULATORY_CARE_PROVIDER_SITE_OTHER): Payer: Medicare PPO

## 2022-02-09 VITALS — Wt 193.0 lb

## 2022-02-09 DIAGNOSIS — Z0000 Encounter for general adult medical examination without abnormal findings: Secondary | ICD-10-CM

## 2022-02-09 NOTE — Progress Notes (Signed)
Virtual Visit via Telephone Note  I connected with  Brittney Pierce on 02/09/22 at 11:00 AM EDT by telephone and verified that I am speaking with the correct person using two identifiers.  Location: Patient: home Provider: [place] Persons participating in the virtual visit: [place]   I discussed the limitations, risks, security and privacy concerns of performing an evaluation and management service by telephone and the availability of in person appointments. The patient expressed understanding and agreed to proceed.  Interactive audio and video telecommunications were attempted between this nurse and patient, however failed, due to patient having technical difficulties OR patient did not have access to video capability.  We continued and completed visit with audio only.  Some vital signs may be absent or patient reported.   Dionisio David, LPN  Subjective:   Brittney Pierce is a 71 y.o. female who presents for Medicare Annual (Subsequent) preventive examination.  Review of Systems     Cardiac Risk Factors include: advanced age (>7mn, >>65women);hypertension;diabetes mellitus     Objective:    There were no vitals filed for this visit. There is no height or weight on file to calculate BMI.     02/09/2022   11:06 AM 05/29/2020   11:13 AM 03/22/2019    9:20 AM 03/17/2019   12:41 PM 05/10/2018   10:42 AM 05/04/2017   10:10 AM  Advanced Directives  Does Patient Have a Medical Advance Directive? No Yes No No Yes Yes  Type of ASocial research officer, governmentLiving will   HCenter LineLiving will HPinewood EstatesLiving will  Copy of HG[place]in Chart?  No - copy requested   No - copy requested No - copy requested  Would patient like information on creating a medical advance directive? No - Patient declined  No - Patient declined       Current Medications  (verified) Outpatient Encounter Medications as of 02/09/2022  Medication Sig   BIOTIN PO Take 1 capsule by mouth daily.    Calcium 1200-1000 MG-UNIT CHEW Chew 1 tablet by mouth daily.   cetirizine (ZYRTEC) 10 MG tablet Take 10 mg by mouth daily.   clotrimazole-betamethasone (LOTRISONE) cream Apply 1 application topically 2 (two) times daily.   Cranberry-Vitamin C-Vitamin E 4200-20-3 MG-MG-UNIT CAPS Take by mouth. 1 daily   diclofenac sodium (VOLTAREN) 1 % GEL Apply topically as needed.    esomeprazole (NEXIUM) 40 MG capsule Take 1 capsule (40 mg total) by mouth daily. Take at least 1 hour before a meal.   fluticasone (FLONASE) 50 MCG/ACT nasal spray 2 sprays each nostril as directed.   meloxicam (MOBIC) 15 MG tablet Take 1 tablet (15 mg total) by mouth daily.   metoprolol tartrate (LOPRESSOR) 25 MG tablet Take 0.5 tablets (12.5 mg total) by mouth 2 (two) times daily.   Multiple Vitamins-Minerals (MULTIVITAMIN PO) Take 1 tablet by mouth daily.   POTASSIUM GLUCONATE PO Take 1 capsule by mouth daily.   pregabalin (LYRICA) 75 MG capsule Take 75 mg by mouth 2 (two) times daily.   Probiotic Product (PROBIOTIC DAILY PO) Take by mouth.   rosuvastatin (CRESTOR) 5 MG tablet Take 1 tablet (5 mg total) by mouth daily.   traMADol (ULTRAM) 50 MG tablet Take 50 mg by mouth daily as needed.   alendronate (FOSAMAX) 70 MG tablet Take 1 tablet (70 mg total) by mouth every 7 (seven) days. Take with a full glass of water on  an empty stomach. (Patient not taking: Reported on 12/08/2021)   mesalamine (CANASA) 1000 MG suppository PLACE 1 SUPPOSITORY (1,000 MG TOTAL) RECTALLY AT BEDTIME AS NEEDED. (Patient not taking: Reported on 02/09/2022)   No facility-administered encounter medications on file as of 02/09/2022.    Allergies (verified) Amoxicillin, Cephalexin, Prednisone, Bactrim ds [sulfamethoxazole-trimethoprim], Codeine, Doxycycline, Erythromycin, and Sulfamethizole   History: Past Medical History:  Diagnosis  Date   Disorder of bone and cartilage, unspecified    Dysuria    Esophageal reflux    Lumbago    Osteoarthrosis, unspecified whether generalized or localized, unspecified site    Ulcerative colitis, unspecified    Unspecified essential hypertension    Past Surgical History:  Procedure Laterality Date   CATARACT EXTRACTION, BILATERAL     08/2019 and 09/2019   CESAREAN SECTION     x2   [place] SURGERY  2008   Family History  Problem Relation Age of Onset   Heart attack Father 8   Diabetes Father    Lung cancer Mother    Social History   Socioeconomic History   Marital status: Widowed    Spouse name: Not on file   Number of children: Not on file   Years of education: Not on file   Highest education level: Not on file  Occupational History   Occupation: Retired  Tobacco Use   Smoking status: Former    Packs/day: 1.00    Years: 13.00    Total pack years: 13.00    Types: Cigarettes    Quit date: 07/27/1980    Years since quitting: 41.5   Smokeless tobacco: Never  Vaping Use   Vaping Use: Never used  Substance and Sexual Activity   Alcohol use: Yes    Comment: Once a month (maybe)   Drug use: No   Sexual activity: Not Currently  Other Topics Concern   Not on file  Social History Narrative   Regular exercise-yes      M[place] [facility] to be closer to grandchildrenLikes to garden   Does have a living will-but will need to update   Baird Kay (daughter) or sons, are health care POA   .Would desire life support, no prolonged life support if futile.   Social Determinants of Health   Financial Resource Strain: Low Risk  (02/09/2022)   Overall Financial Resource Strain (CARDIA)    Difficulty of Paying Living Expenses: Not hard at all  Food Insecurity: No Food Insecurity (02/09/2022)   Hunger Vital Sign    Worried About Running Out of Food in the Last Year: Never true    Ran Out of Food in the Last Year: Never true  Transportation  Needs: No Transportation Needs (02/09/2022)   PRAPARE - Hydrologist (Medical): No    Lack of Transportation (Non-Medical): No  Physical Activity: Unknown (02/09/2022)   Exercise Vital Sign    Days of Exercise per Week: Patient refused    Minutes of Exercise per Session: Patient refused  Stress: No Stress Concern Present (02/09/2022)   [place]    Feeling of Stress : Only a little  Social Connections: Moderately Isolated (02/09/2022)   Social Connection and Isolation Panel [NHANES]    Frequency of Communication with Friends and Family: More than three times a week    Frequency of Social Gatherings with Friends and Family: More than three times a week  Attends Religious Services: More than 4 times per year    Active Member of Clubs or Organizations: No    Attends Archivist Meetings: Never    Marital Status: Widowed    Tobacco Counseling Counseling given: Not Answered   Clinical Intake:  Pre-visit preparation completed: Yes  Pain : No/denies pain     Nutritional Risks: None Diabetes: Yes CBG done?: No Did pt. bring in CBG monitor from home?: No  How often do you need to have someone help you when you read instructions, pamphlets, or other written materials from your doctor or pharmacy?: 1 - Never  Diabetic?yes Nutrition Risk Assessment:  Has the patient had any N/V/D within the last 2 months?  No  Does the patient have any non-healing wounds?  No  Has the patient had any unintentional weight loss or weight gain?  No   Diabetes:  Is the patient diabetic?  Yes  If diabetic, was a CBG obtained today?  No  Did the patient bring in their glucometer from home?  No  How often do you monitor your CBG's? never.   Financial Strains and Diabetes Management:  Are you having any financial strains with the device, your supplies or your medication? No .  Does the patient  want to be seen by Chronic Care Management for management of their diabetes?  No  Would the patient like to be referred to a Nutritionist or for Diabetic Management?  No   Diabetic Exams:  Diabetic Eye Exam: Completed no. Overdue for diabetic eye exam. Pt has been advised about the importance in completing this exam.   Diabetic Foot Exam: Completed no. Pt has been advised about the importance in completing this exam.    Interpreter Needed?: No  Information entered by :: Kirke Shaggy, LPN   Activities of Daily Living    02/09/2022   11:07 AM 02/08/2022    3:15 PM  In your present state of health, do you have any difficulty performing the following activities:  Hearing? 0 0  Vision? 0 0  Difficulty concentrating or making decisions? 0 0  Walking or climbing stairs? 0 0  Dressing or bathing? 0 0  Doing errands, shopping? 0 0  Preparing Food and eating ? N N  Using the Toilet? N N  In the past six months, have you accidently leaked urine? Y Y  Do you have problems with loss of bowel control? N N  Managing your Medications? N N  Managing your Finances? N N  Housekeeping or managing your Housekeeping?  N    Patient Care Team: Michela Pitcher, NP as PCP - Okey Regal, PA-C (Inactive) as Consulting Physician (Gastroenterology) Ree Edman, MD as Consulting Physician (Dermatology) Roosvelt Harps., OD as Consulting Physician (Optometry) [name], [facility] as Pharmacist (Pharmacist)  Indicate any recent Medical Services you may have received from other than [facility] providers in the past year (date may be approximate).     Assessment:   This is a routine wellness examination for [place].  Hearing/Vision screen Hearing Screening - Comments:: No aids Vision Screening - Comments:: Readers- [place]  Dietary issues and exercise activities discussed: Current Exercise Habits: The patient does not participate in regular exercise at present   Goals Addressed              This Visit's Progress    DIET - EAT MORE FRUITS AND VEGETABLES         Depression Screen    02/09/2022  11:04 AM 12/08/2021   11:49 AM 09/10/2021    2:36 PM 05/29/2020   11:14 AM 05/22/2019   10:28 AM 05/10/2018   10:43 AM 05/04/2017   10:08 AM  PHQ 2/9 Scores  PHQ - 2 Score 0 0 0 0 0 0 0  PHQ- 9 Score 1 3  0  0 0    Fall Risk    02/09/2022   11:06 AM 02/08/2022    3:15 PM 09/10/2021    2:34 PM 05/29/2020   11:14 AM 05/22/2019   10:28 AM  Fall Risk   Falls in the past year? 0 0 0 0 0  Number falls in past yr: 0  0 0   Injury with Fall? 0  0 0   Risk for fall due to : No Fall Risks   Medication side effect   Follow up Falls evaluation completed   Falls evaluation completed;Falls prevention discussed     FALL RISK PREVENTION PERTAINING TO THE HOME:  Any stairs in or around the home? Yes  If so, are there any without handrails? No  Home free of loose throw rugs in walkways, pet beds, electrical cords, etc? Yes  Adequate lighting in your home to reduce risk of falls? Yes   ASSISTIVE DEVICES UTILIZED TO PREVENT FALLS:  Life alert? No  Use of a cane, walker or w/c? Yes  Grab bars in the bathroom? Yes  Shower chair or bench in shower? Yes  Elevated toilet seat or a handicapped toilet? Yes   Cognitive Function:    05/29/2020   11:16 AM 05/10/2018   10:43 AM 05/04/2017   10:10 AM  MMSE - Mini Mental State Exam  Orientation to time 5 5 5   Orientation to Place 5 5 5   Registration 3 3 3   Attention/ Calculation 5 0 0  Recall 3 3 3   Language- name 2 objects  0 0  Language- repeat 1 1 1   Language- follow 3 step command  3 3  Language- read & follow direction  0 0  Write a sentence  0 0  Copy design  0 0  Total score  20 20        02/09/2022   11:07 AM  6CIT Screen  What Year? 0 points  What month? 0 points  What time? 0 points  Count back from 20 0 points  Months in reverse 0 points  Repeat phrase 0 points  Total Score 0 points     Immunizations Immunization History  Administered Date(s) Administered   Fluad Quad(high Dose 65+) 06/01/2019, 04/24/2020   Influenza Split 05/12/2011   Influenza Whole 04/29/2010   Influenza, High Dose Seasonal PF 04/30/2017, 04/08/2018   Influenza,inj,Quad PF,6+ Mos 04/25/2013, 05/07/2014, 03/20/2015   PFIZER(Purple Top)SARS-COV-2 Vaccination 09/17/2019, 10/10/2019, 05/01/2020, 11/12/2020   PNEUMOCOCCAL CONJUGATE-20 12/08/2021   Pfizer Covid-19 Vaccine Bivalent Booster 85yr & up 02/23/2021   Pneumococcal Conjugate-13 03/20/2015   Pneumococcal Polysaccharide-23 02/13/2014   Tdap 02/21/2020   Zoster Recombinat (Shingrix) 06/05/2020, 08/22/2020   Zoster, Live 04/07/2011    TDAP status: Up to date  Flu Vaccine status: Up to date  Pneumococcal vaccine status: Up to date  Covid-19 vaccine status: Completed vaccines  Qualifies for Shingles Vaccine? Yes   Zostavax completed Yes   Shingrix Completed?: Yes  Screening Tests Health Maintenance  Topic Date Due   FOOT EXAM  Never done   OPHTHALMOLOGY EXAM  Never done   COLON CANCER SCREENING ANNUAL FOBT  05/30/2021   COVID-19 Vaccine (  6 - Pfizer series) 06/25/2021   INFLUENZA VACCINE  02/24/2022   HEMOGLOBIN A1C  03/10/2022   URINE MICROALBUMIN  12/09/2022   MAMMOGRAM  11/07/2023   Pneumonia Vaccine 106+ Years old  Completed   DEXA SCAN  Completed   Hepatitis C Screening  Completed   Zoster Vaccines- Shingrix  Completed   HPV VACCINES  Aged Out   COLONOSCOPY (Pts 45-48yr Insurance coverage will need to be confirmed)  Discontinued   TETANUS/TDAP  Discontinued    Health Maintenance  Health Maintenance Due  Topic Date Due   FOOT EXAM  Never done   OPHTHALMOLOGY EXAM  Never done   COLON CANCER SCREENING ANNUAL FOBT  05/30/2021   COVID-19 Vaccine (6 - [place]series) 06/25/2021    Colorectal cancer screening: Type of screening: FOBT/FIT. Completed 05/30/20. Repeat every 1 years  Mammogram status: Completed 11/06/21.  Repeat every year  Bone Density status: Completed 05/16/21. Results reflect: Bone density results: OSTEOPENIA. Repeat every 5 years.  Lung Cancer Screening: (Low Dose CT Chest recommended if Age 73-80years, 30 pack-year currently smoking OR have quit w/in 15years.) does not qualify.    Additional Screening:  Hepatitis C Screening: does qualify; Completed 04/28/16  Vision Screening: Recommended annual ophthalmology exams for early detection of glaucoma and other disorders of the eye. Is the patient up to date with their annual eye exam?  No  Who is the provider or what is the name of the office in which the patient attends annual eye exams? [facility]  If pt is not established with a provider, would they like to be referred to a provider to establish care? No .   Dental Screening: Recommended annual dental exams for proper oral hygiene  Community Resource Referral / Chronic Care Management: CRR required this visit?  No   CCM required this visit?  No      Plan:     I have personally reviewed and noted the following in the patient's chart:   Medical and social history Use of alcohol, tobacco or illicit drugs  Current medications and supplements including opioid prescriptions.  Functional ability and status Nutritional status Physical activity Advanced directives List of other physicians Hospitalizations, surgeries, and ER visits in previous 12 months Vitals Screenings to include cognitive, depression, and falls Referrals and appointments  In addition, I have reviewed and discussed with patient certain preventive protocols, quality metrics, and best practice recommendations. A written personalized care plan for preventive services as well as general preventive health recommendations were provided to patient.     LDionisio David LPN   [id number]  Nurse Notes: none

## 2022-02-09 NOTE — Patient Instructions (Signed)
Brittney Pierce , Thank you for taking time to come for your Medicare Wellness Visit. I appreciate your ongoing commitment to your health goals. Please review the following plan we discussed and let me know if I can assist you in the future.   Screening recommendations/referrals: Colonoscopy: FOBT 05/30/20 Mammogram: 11/06/21 Bone Density: 05/16/21 Recommended yearly ophthalmology/optometry visit for glaucoma screening and checkup Recommended yearly dental visit for hygiene and checkup  Vaccinations: Influenza vaccine: n/d Pneumococcal vaccine: 12/08/21 Tdap vaccine: 02/21/20 Shingles vaccine: Zostavax 04/07/11  Shingrix   Covid-19:09/17/19, 10/10/19, 05/01/20, 11/12/20, 02/23/21  Advanced directives: no  Conditions/risks identified: none  Next appointment: Follow up in one year for your annual wellness visit 02/11/23 @ 10 am by phone   Preventive Care 65 Years and Older, Female Preventive care refers to lifestyle choices and visits with your health care provider that can promote health and wellness. What does preventive care include? A yearly physical exam. This is also called an annual well check. Dental exams once or twice a year. Routine eye exams. Ask your health care provider how often you should have your eyes checked. Personal lifestyle choices, including: Daily care of your teeth and gums. Regular physical activity. Eating a healthy diet. Avoiding tobacco and drug use. Limiting alcohol use. Practicing safe sex. Taking low-dose aspirin every day. Taking vitamin and mineral supplements as recommended by your health care provider. What happens during an annual well check? The services and screenings done by your health care provider during your annual well check will depend on your age, overall health, lifestyle risk factors, and family history of disease. Counseling  Your health care provider may ask you questions about your: Alcohol use. Tobacco use. Drug use. Emotional  well-being. Home and relationship well-being. Sexual activity. Eating habits. History of falls. Memory and ability to understand (cognition). Work and work Statistician. Reproductive health. Screening  You may have the following tests or measurements: Height, weight, and BMI. Blood pressure. Lipid and cholesterol levels. These may be checked every 5 years, or more frequently if you are over 9 years old. Skin check. Lung cancer screening. You may have this screening every year starting at age 50 if you have a 30-pack-year history of smoking and currently smoke or have quit within the past 15 years. Fecal occult blood test (FOBT) of the stool. You may have this test every year starting at age 50. Flexible sigmoidoscopy or colonoscopy. You may have a sigmoidoscopy every 5 years or a colonoscopy every 10 years starting at age 49. Hepatitis C blood test. Hepatitis B blood test. Sexually transmitted disease (STD) testing. Diabetes screening. This is done by checking your blood sugar (glucose) after you have not eaten for a while (fasting). You may have this done every 1-3 years. Bone density scan. This is done to screen for osteoporosis. You may have this done starting at age 88. Mammogram. This may be done every 1-2 years. Talk to your health care provider about how often you should have regular mammograms. Talk with your health care provider about your test results, treatment options, and if necessary, the need for more tests. Vaccines  Your health care provider may recommend certain vaccines, such as: Influenza vaccine. This is recommended every year. Tetanus, diphtheria, and acellular pertussis (Tdap, Td) vaccine. You may need a Td booster every 10 years. Zoster vaccine. You may need this after age 33. Pneumococcal 13-valent conjugate (PCV13) vaccine. One dose is recommended after age 29. Pneumococcal polysaccharide (PPSV23) vaccine. One dose is recommended after age  64. Talk to your  health care provider about which screenings and vaccines you need and how often you need them. This information is not intended to replace advice given to you by your health care provider. Make sure you discuss any questions you have with your health care provider. Document Released: 08/09/2015 Document Revised: 04/01/2016 Document Reviewed: 05/14/2015 Elsevier Interactive Patient Education  2017 [place] Prevention in the Home Falls can cause injuries. They can happen to people of all ages. There are many things you can do to make your home safe and to help prevent falls. What can I do on the outside of my home? Regularly fix the edges of walkways and driveways and fix any cracks. Remove anything that might make you trip as you walk through a door, such as a raised step or threshold. Trim any bushes or trees on the path to your home. Use bright outdoor lighting. Clear any walking paths of anything that might make someone trip, such as rocks or tools. Regularly check to see if handrails are loose or broken. Make sure that both sides of any steps have handrails. Any raised decks and porches should have guardrails on the edges. Have any leaves, snow, or ice cleared regularly. Use sand or salt on walking paths during winter. Clean up any spills in your garage right away. This includes oil or grease spills. What can I do in the bathroom? Use night lights. Install grab bars by the toilet and in the tub and shower. Do not use towel bars as grab bars. Use non-skid mats or decals in the tub or shower. If you need to sit down in the shower, use a plastic, non-slip stool. Keep the floor dry. Clean up any water that spills on the floor as soon as it happens. Remove soap buildup in the tub or shower regularly. Attach bath mats securely with double-sided non-slip rug tape. Do not have throw rugs and other things on the floor that can make you trip. What can I do in the bedroom? Use night  lights. Make sure that you have a light by your bed that is easy to reach. Do not use any sheets or blankets that are too big for your bed. They should not hang down onto the floor. Have a firm chair that has side arms. You can use this for support while you get dressed. Do not have throw rugs and other things on the floor that can make you trip. What can I do in the kitchen? Clean up any spills right away. Avoid walking on wet floors. Keep items that you use a lot in easy-to-reach places. If you need to reach something above you, use a strong step stool that has a grab bar. Keep electrical cords out of the way. Do not use floor polish or wax that makes floors slippery. If you must use wax, use non-skid floor wax. Do not have throw rugs and other things on the floor that can make you trip. What can I do with my stairs? Do not leave any items on the stairs. Make sure that there are handrails on both sides of the stairs and use them. Fix handrails that are broken or loose. Make sure that handrails are as long as the stairways. Check any carpeting to make sure that it is firmly attached to the stairs. Fix any carpet that is loose or worn. Avoid having throw rugs at the top or bottom of the stairs. If you do have  throw rugs, attach them to the floor with carpet tape. Make sure that you have a light switch at the top of the stairs and the bottom of the stairs. If you do not have them, ask someone to add them for you. What else can I do to help prevent falls? Wear shoes that: Do not have high heels. Have rubber bottoms. Are comfortable and fit you well. Are closed at the toe. Do not wear sandals. If you use a stepladder: Make sure that it is fully opened. Do not climb a closed stepladder. Make sure that both sides of the stepladder are locked into place. Ask someone to hold it for you, if possible. Clearly mark and make sure that you can see: Any grab bars or handrails. First and last  steps. Where the edge of each step is. Use tools that help you move around (mobility aids) if they are needed. These include: Canes. Walkers. Scooters. Crutches. Turn on the lights when you go into a dark area. Replace any light bulbs as soon as they burn out. Set up your furniture so you have a clear path. Avoid moving your furniture around. If any of your floors are uneven, fix them. If there are any pets around you, be aware of where they are. Review your medicines with your doctor. Some medicines can make you feel dizzy. This can increase your chance of falling. Ask your doctor what other things that you can do to help prevent falls. This information is not intended to replace advice given to you by your health care provider. Make sure you discuss any questions you have with your health care provider. Document Released: 05/09/2009 Document Revised: 12/19/2015 Document Reviewed: 08/17/2014 Elsevier Interactive Patient Education  2017 [employer].

## 2022-02-24 DIAGNOSIS — Z79899 Other long term (current) drug therapy: Secondary | ICD-10-CM | POA: Diagnosis not present

## 2022-02-24 DIAGNOSIS — Z79891 Long term (current) use of opiate analgesic: Secondary | ICD-10-CM | POA: Diagnosis not present

## 2022-02-24 DIAGNOSIS — M419 Scoliosis, unspecified: Secondary | ICD-10-CM | POA: Diagnosis not present

## 2022-02-24 DIAGNOSIS — G894 Chronic pain syndrome: Secondary | ICD-10-CM | POA: Diagnosis not present

## 2022-03-05 DIAGNOSIS — H33322 Round hole, left eye: Secondary | ICD-10-CM | POA: Diagnosis not present

## 2022-03-05 DIAGNOSIS — Z9842 Cataract extraction status, left eye: Secondary | ICD-10-CM | POA: Diagnosis not present

## 2022-03-05 DIAGNOSIS — H26493 Other secondary cataract, bilateral: Secondary | ICD-10-CM | POA: Diagnosis not present

## 2022-03-05 DIAGNOSIS — H0288B Meibomian gland dysfunction left eye, upper and lower eyelids: Secondary | ICD-10-CM | POA: Diagnosis not present

## 2022-03-05 DIAGNOSIS — Z9841 Cataract extraction status, right eye: Secondary | ICD-10-CM | POA: Diagnosis not present

## 2022-03-05 DIAGNOSIS — E119 Type 2 diabetes mellitus without complications: Secondary | ICD-10-CM | POA: Diagnosis not present

## 2022-03-05 DIAGNOSIS — H52223 Regular astigmatism, bilateral: Secondary | ICD-10-CM | POA: Diagnosis not present

## 2022-03-05 LAB — HM DIABETES EYE EXAM

## 2022-03-06 ENCOUNTER — Encounter: Payer: Self-pay | Admitting: Optometry

## 2022-03-06 ENCOUNTER — Other Ambulatory Visit: Payer: Self-pay | Admitting: Nurse Practitioner

## 2022-03-06 DIAGNOSIS — Z760 Encounter for issue of repeat prescription: Secondary | ICD-10-CM

## 2022-03-06 DIAGNOSIS — E1165 Type 2 diabetes mellitus with hyperglycemia: Secondary | ICD-10-CM

## 2022-03-19 DIAGNOSIS — M419 Scoliosis, unspecified: Secondary | ICD-10-CM | POA: Diagnosis not present

## 2022-03-19 DIAGNOSIS — M5136 Other intervertebral disc degeneration, lumbar region: Secondary | ICD-10-CM | POA: Diagnosis not present

## 2022-03-21 DIAGNOSIS — N390 Urinary tract infection, site not specified: Secondary | ICD-10-CM | POA: Diagnosis not present

## 2022-04-01 ENCOUNTER — Ambulatory Visit (INDEPENDENT_AMBULATORY_CARE_PROVIDER_SITE_OTHER): Payer: Medicare PPO | Admitting: Nurse Practitioner

## 2022-04-01 VITALS — BP 120/76 | HR 83 | Temp 96.9°F | Resp 14 | Ht 64.0 in | Wt 193.5 lb

## 2022-04-01 DIAGNOSIS — R300 Dysuria: Secondary | ICD-10-CM

## 2022-04-01 DIAGNOSIS — N390 Urinary tract infection, site not specified: Secondary | ICD-10-CM | POA: Diagnosis not present

## 2022-04-01 DIAGNOSIS — R109 Unspecified abdominal pain: Secondary | ICD-10-CM | POA: Insufficient documentation

## 2022-04-01 LAB — POC URINALSYSI DIPSTICK (AUTOMATED)
Bilirubin, UA: NEGATIVE
Blood, UA: NEGATIVE
Glucose, UA: NEGATIVE
Ketones, UA: NEGATIVE
Leukocytes, UA: NEGATIVE
Nitrite, UA: NEGATIVE
Protein, UA: NEGATIVE
Spec Grav, UA: <=1.005 — AB (ref 1.010–1.025)
Urobilinogen, UA: 0.2 E.U./dL
pH, UA: 6 (ref 5.0–8.0)

## 2022-04-01 MED ORDER — ESTROGENS CONJUGATED 0.625 MG/GM VA CREA: 1.0000 | TOPICAL_CREAM | Freq: Every day | VAGINAL | 0 refills | Status: DC

## 2022-04-01 NOTE — Patient Instructions (Signed)
I have sent in a script for the vaginal cream Follow up with me in 3 months for you recheck You will do the cream daily for 2 weeks them Monday, Wednesday, Friday there after

## 2022-04-01 NOTE — Assessment & Plan Note (Signed)
UA negative in office.  Exam benign in office.  Pending urinary culture

## 2022-04-01 NOTE — Assessment & Plan Note (Signed)
UA negative pending urine culture.

## 2022-04-01 NOTE — Assessment & Plan Note (Signed)
Patient is having recurrent urinary tract infections.  We will put her on Premarin vaginal cream 1 application daily for 2 weeks then Monday Wednesday Friday application thereafter.

## 2022-04-01 NOTE — Progress Notes (Signed)
Acute Office Visit  Subjective:     Patient ID: Brittney Pierce, female    DOB: [birth year], 71 y.o.   MRN: [id number]  Chief Complaint  Patient presents with   Follow-up    Martin Majestic to Urgent care on 03/21/22, was started on Cipro but then changed to [facility] after finding out infection was Klebsiella Oxytoca. Feels better. But has like "a pressure/discomfort in the vaginal area," fatigue.    HPI Patient is in today for Urinary symptoms  Patient was seen at [facility] on 03/21/2022 and dx with UTi she was prescribed cipro and when culutre came back they did macrobid. States that she finsihed her macrobid last nithg. States that hse does have anxiert and has some pressure in the lower abdmone and some dysuria.   States that she does not fell normal per her report. States that she had sweats and chills two day into it all. States that she is still sweating at night.   States she was evaluated by colleague and he suggested doing a topical vaginal estrogen that may help prevent some the urinary tract infection that she does have been recurrently.  Patient is interested in that today.  Patient has no history of cancers.  Also no history of abnormal vaginal bleeding.  Patient states she had done this in the past and tolerated it well.  Patient has a low cardiovascular risk  Review of Systems  Constitutional:  Positive for malaise/fatigue. Negative for chills and fever.  HENT:  Negative for ear pain, sinus pain and sore throat.   Gastrointestinal:  Positive for abdominal pain. Negative for constipation, nausea and vomiting.  Genitourinary:  Positive for dysuria. Negative for frequency, hematuria and urgency.        Objective:    BP 120/76   Pulse 83   Temp (!) 96.9 F (36.1 C)   Resp 14   Ht 5' 4"  (1.626 m)   Wt 193 lb 8 oz (87.8 kg)   SpO2 99%   BMI 33.21 kg/m    Physical Exam Vitals and nursing note reviewed.  Constitutional:      Appearance: Normal appearance.   Cardiovascular:     Rate and Rhythm: Normal rate and regular rhythm.     Heart sounds: Normal heart sounds.  Pulmonary:     Effort: Pulmonary effort is normal.     Breath sounds: Normal breath sounds.  Abdominal:     General: Bowel sounds are normal. There is no distension.     Palpations: There is no mass.     Tenderness: There is no abdominal tenderness. There is no right CVA tenderness or left CVA tenderness.     Hernia: No hernia is present.  Skin:    General: Skin is warm.  Neurological:     Mental Status: She is alert.     No results found for any visits on 04/01/22.      Assessment & Plan:   Problem List Items Addressed This Visit       Genitourinary   Recurrent UTI    Patient is having recurrent urinary tract infections.  We will put her on Premarin vaginal cream 1 application daily for 2 weeks then Monday Wednesday Friday application thereafter.      Relevant Medications   conjugated estrogens (PREMARIN) vaginal cream     Other   Dysuria    UA negative pending urine culture.      Relevant Orders   Urine Culture   Abdominal pressure -  Primary    UA negative in office.  Exam benign in office.  Pending urinary culture      Relevant Orders   POCT Urinalysis Dipstick (Automated)   Urine Culture     Meds ordered this encounter  Medications   DISCONTD: conjugated estrogens (PREMARIN) vaginal cream    Sig: Place 1 Applicatorful vaginally daily. For 2 weeks and then 3 times weekly    Dispense:  42.5 g    Refill:  0    Order Specific Question:   Supervising Provider    Answer:   Loura Pardon A [[id number]]   conjugated estrogens (PREMARIN) vaginal cream    Sig: Place 1 Applicatorful vaginally daily. For 2 weeks and then 3 times weekly    Dispense:  42.5 g    Refill:  0    Order Specific Question:   Supervising Provider    Answer:   Loura Pardon A [[id number]]    Return in about 3 months (around 07/01/2022) for Recheck.  Romilda Garret, NP

## 2022-04-02 LAB — URINE CULTURE
MICRO NUMBER:: 13879241
Result:: NO GROWTH
SPECIMEN QUALITY:: ADEQUATE

## 2022-04-06 ENCOUNTER — Telehealth: Payer: Self-pay | Admitting: Nurse Practitioner

## 2022-04-06 NOTE — Telephone Encounter (Signed)
Called patient to discuss vaginal estrogen. Left message to call me back

## 2022-04-06 NOTE — Telephone Encounter (Signed)
Called and discussed d/cing topical estrogen. She will alert me if she gets more UTIs and we can consider a urology referral at that point

## 2022-04-21 ENCOUNTER — Ambulatory Visit (INDEPENDENT_AMBULATORY_CARE_PROVIDER_SITE_OTHER): Payer: Medicare PPO | Admitting: Nurse Practitioner

## 2022-04-21 VITALS — BP 136/70 | HR 66 | Temp 95.8°F | Resp 14 | Ht 64.0 in | Wt 191.5 lb

## 2022-04-21 DIAGNOSIS — R300 Dysuria: Secondary | ICD-10-CM

## 2022-04-21 DIAGNOSIS — Z87440 Personal history of urinary (tract) infections: Secondary | ICD-10-CM | POA: Diagnosis not present

## 2022-04-21 DIAGNOSIS — N3091 Cystitis, unspecified with hematuria: Secondary | ICD-10-CM | POA: Diagnosis not present

## 2022-04-21 DIAGNOSIS — N390 Urinary tract infection, site not specified: Secondary | ICD-10-CM | POA: Diagnosis not present

## 2022-04-21 LAB — POC URINALSYSI DIPSTICK (AUTOMATED)
Bilirubin, UA: NEGATIVE
Glucose, UA: NEGATIVE
Ketones, UA: NEGATIVE
Nitrite, UA: NEGATIVE
Protein, UA: POSITIVE — AB
Spec Grav, UA: 1.01 (ref 1.010–1.025)
Urobilinogen, UA: 0.2 E.U./dL
pH, UA: 6 (ref 5.0–8.0)

## 2022-04-21 MED ORDER — NITROFURANTOIN MONOHYD MACRO 100 MG PO CAPS: 100.0000 mg | ORAL_CAPSULE | Freq: Two times a day (BID) | ORAL | 0 refills | Status: AC

## 2022-04-21 NOTE — Assessment & Plan Note (Signed)
UA positive in office.

## 2022-04-21 NOTE — Assessment & Plan Note (Signed)
Patient has many UTIs approximately 4 over the past year.  Imaging referral to urology.

## 2022-04-21 NOTE — Assessment & Plan Note (Signed)
UA indicative of cystitis.  Pending urine culture we will treat with Macrobid 100 mg twice daily for 5 days.

## 2022-04-21 NOTE — Patient Instructions (Signed)
Nice to see you today I have referred you toe urology I have sent in the antibiotic to the pharmacy Follow up if no improvement

## 2022-04-21 NOTE — Progress Notes (Signed)
Established Patient Office Visit  Subjective   Patient ID: Brittney Pierce, female    DOB: Jun 10, 1951  Age: 71 y.o. MRN: [id number]  Chief Complaint  Patient presents with   burning with urination    X 1 week at least, throbbing and pressure in the vaginal area, some frequency.    HPI  Urinary symptoms: has been approx one week ago. States that she has been having pressure, frequency and dysuria. States that it is worse int he morning bad does better in the afternoon  No over the counter    Review of Systems  Constitutional:  Negative for chills and fever.  Gastrointestinal:  Negative for abdominal pain, nausea and vomiting.  Musculoskeletal:  Negative for back pain.      Objective:     BP 136/70   Pulse 66   Temp (!) 95.8 F (35.4 C) (Temporal)   Resp 14   Ht 5' 4"  (1.626 m)   Wt 191 lb 8 oz (86.9 kg)   SpO2 100%   BMI 32.87 kg/m    Physical Exam Vitals and nursing note reviewed.  Constitutional:      Appearance: Normal appearance.  Cardiovascular:     Rate and Rhythm: Normal rate and regular rhythm.     Heart sounds: Normal heart sounds.  Pulmonary:     Effort: Pulmonary effort is normal.     Breath sounds: Normal breath sounds.  Abdominal:     General: Bowel sounds are normal. There is no distension.     Palpations: There is no mass.     Tenderness: There is no abdominal tenderness. There is no right CVA tenderness or left CVA tenderness.     Hernia: No hernia is present.  Neurological:     Mental Status: She is alert.      Results for orders placed or performed in visit on 04/21/22  POCT Urinalysis Dipstick (Automated)  Result Value Ref Range   Color, UA light yellow    Clarity, UA cloudy    Glucose, UA Negative Negative   Bilirubin, UA negative    Ketones, UA negative    Spec Grav, UA 1.010 1.010 - 1.025   Blood, UA 3+    pH, UA 6.0 5.0 - 8.0   Protein, UA Positive (A) Negative   Urobilinogen, UA 0.2 0.2 or 1.0 E.U./dL    Nitrite, UA negative    Leukocytes, UA Large (3+) (A) Negative      The 10-year ASCVD risk score (Arnett DK, et al., 2019) is: 31.8%    Assessment & Plan:   Problem List Items Addressed This Visit       Genitourinary   Recurrent UTI    Patient has many UTIs approximately 4 over the past year.  Imaging referral to urology.      Relevant Medications   nitrofurantoin, macrocrystal-monohydrate, (MACROBID) 100 MG capsule   Other Relevant Orders   Ambulatory referral to Urology   Cystitis with hematuria    UA indicative of cystitis.  Pending urine culture we will treat with Macrobid 100 mg twice daily for 5 days.      Relevant Medications   nitrofurantoin, macrocrystal-monohydrate, (MACROBID) 100 MG capsule   Other Relevant Orders   Urine Culture     Other   Burning with urination - Primary    UA positive in office.      Relevant Orders   POCT Urinalysis Dipstick (Automated) (Completed)    Return if symptoms worsen or fail to  improve.    Romilda Garret, NP

## 2022-04-23 LAB — URINE CULTURE
MICRO NUMBER:: 13969604
SPECIMEN QUALITY:: ADEQUATE

## 2022-04-24 ENCOUNTER — Telehealth: Payer: Self-pay | Admitting: Nurse Practitioner

## 2022-04-24 MED ORDER — NITROFURANTOIN MONOHYD MACRO 100 MG PO CAPS: 100.0000 mg | ORAL_CAPSULE | Freq: Two times a day (BID) | ORAL | 0 refills | Status: AC

## 2022-04-24 NOTE — Telephone Encounter (Signed)
Left message to call back Symptoms better, same or worse? Any new symptoms?

## 2022-04-24 NOTE — Telephone Encounter (Signed)
Pt stated on the night she had her appt, 04/21/22,  she got really sick, a fever, chills, aches & pains, lasted 24hrs. Pt still feels uti feeling. Pt has taken antibiotics but they don't seem to be working. Call back # [phone]

## 2022-04-24 NOTE — Telephone Encounter (Signed)
Last time 7 days cleared the infection as evidenced by repeat UA. 2 days extra sent in

## 2022-04-24 NOTE — Telephone Encounter (Signed)
Patient advised.

## 2022-04-24 NOTE — Telephone Encounter (Signed)
Can check in on patient and see how she is doing. Her culture showed that the antibiotic should be effective. I can extend for an additional 2 days if she would like

## 2022-04-24 NOTE — Telephone Encounter (Signed)
Patient states she is feeling better some, has burning with urination still. Patient states she is about 50% better. Last time she had UTI she did 7 days worth and it did not clear things up then and does not feel like 5 days will work maybe 10 days to make sure? Patient has a lot to do and help with family this weekend and wants to make sure she does not get worse.

## 2022-04-24 NOTE — Telephone Encounter (Signed)
Pt wanted to speak about the results she got back from recent test. Pt stated she got left a message about results on mychart. Pt states she still doesn't feel better. Call back # is [phone].

## 2022-04-25 ENCOUNTER — Encounter: Payer: Self-pay | Admitting: Nurse Practitioner

## 2022-04-27 NOTE — Telephone Encounter (Signed)
Spoke with [facility] today and they did have the RX for Macrobid that was sent on 04/24/22. I spoke with patient and let her know this, not sure what happened as to why patient was told they did not have this.

## 2022-05-04 ENCOUNTER — Encounter: Payer: Self-pay | Admitting: Urology

## 2022-05-04 ENCOUNTER — Telehealth: Payer: Self-pay | Admitting: *Deleted

## 2022-05-04 ENCOUNTER — Ambulatory Visit (INDEPENDENT_AMBULATORY_CARE_PROVIDER_SITE_OTHER): Payer: Medicare PPO | Admitting: Urology

## 2022-05-04 VITALS — BP 148/85 | HR 87 | Ht 64.0 in | Wt 191.0 lb

## 2022-05-04 DIAGNOSIS — R350 Frequency of micturition: Secondary | ICD-10-CM | POA: Diagnosis not present

## 2022-05-04 DIAGNOSIS — R3915 Urgency of urination: Secondary | ICD-10-CM | POA: Diagnosis not present

## 2022-05-04 DIAGNOSIS — Z87440 Personal history of urinary (tract) infections: Secondary | ICD-10-CM | POA: Diagnosis not present

## 2022-05-04 DIAGNOSIS — R102 Pelvic and perineal pain: Secondary | ICD-10-CM

## 2022-05-04 DIAGNOSIS — N390 Urinary tract infection, site not specified: Secondary | ICD-10-CM | POA: Diagnosis not present

## 2022-05-04 MED ORDER — ESTRADIOL 0.1 MG/GM VA CREA: TOPICAL_CREAM | VAGINAL | 1 refills | Status: DC

## 2022-05-04 MED ORDER — NITROFURANTOIN MACROCRYSTAL 50 MG PO CAPS: 50.0000 mg | ORAL_CAPSULE | Freq: Every day | ORAL | 2 refills | Status: DC

## 2022-05-04 NOTE — Patient Instructions (Signed)
Take over the counter D-Mannose daily for UTI prevention

## 2022-05-04 NOTE — Telephone Encounter (Signed)
Notified patient as instructed, patient pleased. Discussed follow-up appointments, patient agrees  

## 2022-05-04 NOTE — Telephone Encounter (Signed)
-----   Message from Abbie Sons, MD sent at 05/04/2022  3:44 PM EDT ----- Regarding: Results Can let patient know her UA showed no evidence of infection.  I sent in low-dose antibiotic nitrofurantoin to [facility].  Once daily x3 months  I also sent the vaginal estrogen cream to [facility]

## 2022-05-04 NOTE — Progress Notes (Unsigned)
05/04/2022 3:29 PM   [place] Mar 30, 1951 [id number]  Referring provider: Michela Pitcher, NP [place],  [place] [address]  Chief Complaint  Patient presents with   Recurrent UTI    HPI: Brittney Pierce is a 71 y.o. female referred for evaluation of recurrent UTIs.  Since menopause estimates she has had 3-4 UTIs per year Complains of recent episode of recurrent/persistent UTI.  Seen 10/2021 with dysuria and mild right flank pain.  Urinalysis with small leukocytes on dipstick.  Urine culture with insignificant growth. Seen at [facility] urgent care 03/21/2022 complaining of dysuria, frequency and urgency.  UA showed 3+ leukocytes and nitrite +.  Treated with Cipro 500 mg twice daily for 3 days.  Urine culture grew Klebsiella oxytoca.  She was subsequently changed to [place] Follow-up visit with PCP 04/01/2022 complaining of persistent pelvic pressure/discomfort after completing her antibiotics.  Dipstick UA and culture were negative Follow-up visit 04/21/2022 complaining of pressure and throbbing sensation pelvis with urinary frequency and dysuria.  Dipstick UA 3+ blood/3+ leukocytes and urine culture grew Klebsiella oxytocin.  She was treated with Macrobid She thinks she may have persistent infection with complaints of pelvic pressure, frequency and urgency She does take cranberry tablets   PMH: Past Medical History:  Diagnosis Date   Disorder of bone and cartilage, unspecified    Dysuria    Esophageal reflux    Lumbago    Osteoarthrosis, unspecified whether generalized or localized, unspecified site    Ulcerative colitis, unspecified    Unspecified essential hypertension     Surgical History: Past Surgical History:  Procedure Laterality Date   CATARACT EXTRACTION, BILATERAL     08/2019 and 09/2019   CESAREAN SECTION     x2   [place] SURGERY  2008    Home Medications:  Allergies as of 05/04/2022        Reactions   Amoxicillin Diarrhea   Cephalexin    Prednisone Other (See Comments)   Sx's of heart attack - elevated BP, HR and chest tightness/pressure.   Bactrim Ds [sulfamethoxazole-trimethoprim] Other (See Comments)   Rash,itching,whelps, throat swelling and feels like lump in throat.   Codeine    REACTION: stomach upset   Doxycycline    GI side effects   Erythromycin    REACTION: stomach upset   Fosamax [alendronate] Other (See Comments)   Throat felt raw.   Sulfamethizole Hives        Medication List        Accurate as of May 04, 2022  3:29 PM. If you have any questions, ask your nurse or doctor.          BIOTIN PO Take 1 capsule by mouth daily.   Calcium 1200-1000 MG-UNIT Chew Chew 1 tablet by mouth daily.   cetirizine 10 MG tablet Commonly known as: ZYRTEC Take 10 mg by mouth daily.   Cranberry-Vitamin C-Vitamin E 4200-20-3 MG-MG-UNIT Caps Take by mouth. 1 daily   Crestor 5 MG tablet Generic drug: rosuvastatin TAKE ONE TABLET BY MOUTH EVERY DAY   diclofenac sodium 1 % Gel Commonly known as: VOLTAREN Apply topically as needed.   fluticasone 50 MCG/ACT nasal spray Commonly known as: FLONASE 2 sprays each nostril as directed.   meloxicam 15 MG tablet Commonly known as: MOBIC TAKE ONE TABLET BY MOUTH EVERY DAY WITH FOOD   mesalamine 1000 MG suppository Commonly known as: CANASA PLACE 1 SUPPOSITORY (1,000 MG TOTAL) RECTALLY AT BEDTIME  AS NEEDED.   metoprolol tartrate 25 MG tablet Commonly known as: LOPRESSOR Take 0.5 tablets (12.5 mg total) by mouth 2 (two) times daily.   MULTIVITAMIN PO Take 1 tablet by mouth daily.   NexIUM 40 MG capsule Generic drug: esomeprazole TAKE ONE CAPSULE BY MOUTH EVERY DAY - TAKE AT LEAST 1 HOUR BEFORE A MEAL   POTASSIUM GLUCONATE PO Take 1 capsule by mouth daily.   pregabalin 75 MG capsule Commonly known as: LYRICA Take 75 mg by mouth 2 (two) times daily.   PROBIOTIC DAILY PO Take by mouth.         Allergies:  Allergies  Allergen Reactions   Amoxicillin Diarrhea   Cephalexin    Prednisone Other (See Comments)    Sx's of heart attack - elevated BP, HR and chest tightness/pressure.   Bactrim Ds [Sulfamethoxazole-Trimethoprim] Other (See Comments)    Rash,itching,whelps, throat swelling and feels like lump in throat.   Codeine     REACTION: stomach upset   Doxycycline     GI side effects   Erythromycin     REACTION: stomach upset   Fosamax [Alendronate] Other (See Comments)    Throat felt raw.   Sulfamethizole Hives    Family History: Family History  Problem Relation Age of Onset   Heart attack Father 63   Diabetes Father    Lung cancer Mother     Social History:  reports that she quit smoking about 41 years ago. Her smoking use included cigarettes. She has a 13.00 pack-year smoking history. She has never used smokeless tobacco. She reports current alcohol use. She reports that she does not use drugs.   Physical Exam: BP (!) 148/85   Pulse 87   Ht 5' 4"  (1.626 m)   Wt 191 lb (86.6 kg)   BMI 32.79 kg/m   Constitutional:  Alert and oriented, No acute distress. HEENT: [place] AT Respiratory: Normal respiratory effort, no increased work of breathing. Psychiatric: Normal mood and affect.  Laboratory Data:  Urinalysis Dipstick/microscopy negative    Assessment & Plan:    1. Recurrent UTI UA today clear Possible etiologies of recurrent infection include periurethral tissue atrophy in postmenopausal woman, constipation, sexual activity, incomplete emptying, anatomic abnormalities, and even genetic predisposition.  Finally, we discussed the role of perineal hygiene, timed voiding, adequate hydration, topical vaginal estrogen, cranberry prophylaxis, and low-dose antibiotic prophylaxis. Symptoms have worsened postmenopausal.  Recommend starting low-dose vaginal estrogen by fingertip method twice weekly.  We discussed this low-dose as an of 2 achieve local effect in the  vaginal area but not enough to be absorbed systemically Low-dose antibiotic prophylaxis x3 months with nitrofurantoin 50 mg daily We discussed d-mannose may be helpful in preventing bacterial adherence Return in as needed for recurrent UTI symptoms   Abbie Sons, [place] [address] [place], [place] [address] [phone]

## 2022-05-05 LAB — URINALYSIS, COMPLETE
Bilirubin, UA: NEGATIVE
Glucose, UA: NEGATIVE
Ketones, UA: NEGATIVE
Leukocytes,UA: NEGATIVE
Nitrite, UA: NEGATIVE
Protein,UA: NEGATIVE
RBC, UA: NEGATIVE
Specific Gravity, UA: <1.005 — ABNORMAL LOW (ref 1.005–1.030)
Urobilinogen, Ur: 0.2 mg/dL (ref 0.2–1.0)
pH, UA: 5.5 (ref 5.0–7.5)

## 2022-05-05 LAB — MICROSCOPIC EXAMINATION: Bacteria, UA: NONE SEEN

## 2022-05-06 ENCOUNTER — Encounter: Payer: Self-pay | Admitting: Urology

## 2022-05-21 DIAGNOSIS — Z961 Presence of intraocular lens: Secondary | ICD-10-CM | POA: Diagnosis not present

## 2022-05-21 DIAGNOSIS — H26493 Other secondary cataract, bilateral: Secondary | ICD-10-CM | POA: Diagnosis not present

## 2022-05-21 DIAGNOSIS — H26491 Other secondary cataract, right eye: Secondary | ICD-10-CM | POA: Diagnosis not present

## 2022-05-21 DIAGNOSIS — E119 Type 2 diabetes mellitus without complications: Secondary | ICD-10-CM | POA: Diagnosis not present

## 2022-05-21 DIAGNOSIS — H18413 Arcus senilis, bilateral: Secondary | ICD-10-CM | POA: Diagnosis not present

## 2022-06-08 ENCOUNTER — Ambulatory Visit: Payer: Medicare PPO | Admitting: Internal Medicine

## 2022-06-10 DIAGNOSIS — M419 Scoliosis, unspecified: Secondary | ICD-10-CM | POA: Diagnosis not present

## 2022-06-10 DIAGNOSIS — M5136 Other intervertebral disc degeneration, lumbar region: Secondary | ICD-10-CM | POA: Diagnosis not present

## 2022-06-16 ENCOUNTER — Telehealth: Payer: Self-pay | Admitting: Nurse Practitioner

## 2022-06-16 DIAGNOSIS — Z760 Encounter for issue of repeat prescription: Secondary | ICD-10-CM

## 2022-06-16 MED ORDER — MELOXICAM 15 MG PO TABS: 15.0000 mg | ORAL_TABLET | Freq: Every day | ORAL | 0 refills | Status: DC

## 2022-06-16 MED ORDER — METOPROLOL TARTRATE 25 MG PO TABS: 12.5000 mg | ORAL_TABLET | Freq: Two times a day (BID) | ORAL | 1 refills | Status: DC

## 2022-06-16 NOTE — Telephone Encounter (Signed)
RX re sent to [facility] and patient advised

## 2022-06-16 NOTE — Telephone Encounter (Signed)
  Encourage patient to contact the pharmacy for refills or they can request refills through [place]:  Please schedule appointment if longer than 1 year  NEXT APPOINTMENT DATE:  MEDICATION:metoprolol tartrate (LOPRESSOR) 25 MG tablet      meloxicam (MOBIC) 15 MG tablet  Is the patient out of medication?   [facility] [place], [place] - [address]   Let patient know to contact pharmacy at the end of the day to make sure medication is ready.  Please notify patient to allow 48-72 hours to process  CLINICAL FILLS OUT ALL BELOW:   LAST REFILL:  QTY:  REFILL DATE:    OTHER COMMENTS:    Okay for refill?  Please advise

## 2022-06-16 NOTE — Addendum Note (Signed)
Addended by: Kris Mouton on: 06/16/2022 03:28 PM   Modules accepted: Orders

## 2022-06-16 NOTE — Telephone Encounter (Signed)
Pt called in would like her RX to be sent to [facility] . Not [facility] . Please advise

## 2022-07-08 ENCOUNTER — Encounter: Payer: Self-pay | Admitting: Nurse Practitioner

## 2022-07-08 ENCOUNTER — Ambulatory Visit (INDEPENDENT_AMBULATORY_CARE_PROVIDER_SITE_OTHER): Payer: Medicare PPO | Admitting: Nurse Practitioner

## 2022-07-08 VITALS — BP 126/80 | HR 97 | Temp 96.8°F | Ht 64.0 in | Wt 187.0 lb

## 2022-07-08 DIAGNOSIS — E1165 Type 2 diabetes mellitus with hyperglycemia: Secondary | ICD-10-CM

## 2022-07-08 DIAGNOSIS — Z780 Asymptomatic menopausal state: Secondary | ICD-10-CM

## 2022-07-08 DIAGNOSIS — M8580 Other specified disorders of bone density and structure, unspecified site: Secondary | ICD-10-CM | POA: Diagnosis not present

## 2022-07-08 DIAGNOSIS — I10 Essential (primary) hypertension: Secondary | ICD-10-CM | POA: Diagnosis not present

## 2022-07-08 DIAGNOSIS — J0140 Acute pansinusitis, unspecified: Secondary | ICD-10-CM | POA: Diagnosis not present

## 2022-07-08 DIAGNOSIS — N390 Urinary tract infection, site not specified: Secondary | ICD-10-CM | POA: Diagnosis not present

## 2022-07-08 LAB — POCT GLYCOSYLATED HEMOGLOBIN (HGB A1C): Hemoglobin A1C: 6.3 % — AB (ref 4.0–5.6)

## 2022-07-08 MED ORDER — DOXYCYCLINE HYCLATE 100 MG PO TABS: 100.0000 mg | ORAL_TABLET | Freq: Two times a day (BID) | ORAL | 0 refills | Status: AC

## 2022-07-08 NOTE — Assessment & Plan Note (Signed)
Well-controlled on metoprolol 25 mg twice daily.  Continue medication as prescribed

## 2022-07-08 NOTE — Assessment & Plan Note (Signed)
Has been established and seen by urology.  Currently on Estrace vaginal cream along with preventative Macrobid 50 mg daily.  Continue taking medication as prescribed follow-up with urology as recommended.

## 2022-07-08 NOTE — Assessment & Plan Note (Signed)
Patient has many antibiotic allergies.  Will go with doxycycline as it does cause GI upset.  Patient has a history of ulcerative proctitis with antibiotic use.  Do not think a fluoroquinolone or clindamycin would be appropriate at this juncture

## 2022-07-08 NOTE — Assessment & Plan Note (Signed)
Patient no longer following with Dr. Kelton Pillar.  A1c 6.3% today.  Patient currently maintained on diet alone continue

## 2022-07-08 NOTE — Progress Notes (Signed)
Established Patient Office Visit  Subjective   Patient ID: Brittney Pierce, female    DOB: Aug 13, 1950  Age: 71 y.o. MRN: [id number]  Chief Complaint  Patient presents with   Follow-up    HPI  Recurrent UTI: States thaa she was seen by Dr. Bernardo Heater and was placed on macrobid once day. D mannose and estrace cream.  She has a follow-up with urology after the first of the year.  HTN: States that BP is doing well and she is tolerating the meteoprolol well. Bp wnl  Ulcerative proctitis: no recent flares in several years. States that she will take mesalamine as needed. She is also on probiotics   Sinus: States that she thinks she has an sinus infection. States that she has allergies and is usin flonase and antihistamine. States that her sinues hurt and some headaches. States that her eyes get goopy when she wakes up. States they are not matted and she is using systane. States that she had a cold early November and went away and has been lingering  States that she has dentures so her teeth do not bother her  DM2: Patient is currently maintained on diet alone.  A1c 6.3% today.  Patient has lost approximately 4 pounds since last office visit.     Review of Systems  Constitutional:  Positive for malaise/fatigue. Negative for chills and fever.  HENT:  Positive for sinus pain. Negative for ear discharge, ear pain and sore throat.   Respiratory:  Negative for cough and shortness of breath.   Neurological:  Positive for headaches.      Objective:     BP 126/80 (BP Location: Left Arm, Patient Position: Sitting, Cuff Size: Normal)   Pulse 97   Temp (!) 96.8 F (36 C) (Temporal)   Ht 5' 4"  (1.626 m)   Wt 187 lb (84.8 kg)   SpO2 99%   BMI 32.10 kg/m  BP Readings from Last 3 Encounters:  07/08/22 126/80  05/04/22 (!) 148/85  04/21/22 136/70   Wt Readings from Last 3 Encounters:  07/08/22 187 lb (84.8 kg)  05/04/22 191 lb (86.6 kg)  04/21/22 191 lb 8 oz (86.9 kg)       Physical Exam Vitals and nursing note reviewed.  Constitutional:      Appearance: Normal appearance.  HENT:     Right Ear: Tympanic membrane, ear canal and external ear normal.     Left Ear: Tympanic membrane, ear canal and external ear normal.     Nose:     Right Sinus: Maxillary sinus tenderness and frontal sinus tenderness present.     Left Sinus: Maxillary sinus tenderness and frontal sinus tenderness present.     Mouth/Throat:     Mouth: Mucous membranes are moist.     Pharynx: Oropharynx is clear.  Cardiovascular:     Rate and Rhythm: Normal rate and regular rhythm.     Heart sounds: Normal heart sounds.  Pulmonary:     Effort: Pulmonary effort is normal.     Breath sounds: Normal breath sounds.  Neurological:     Mental Status: She is alert.      Results for orders placed or performed in visit on 07/08/22  POCT glycosylated hemoglobin (Hb A1C)  Result Value Ref Range   Hemoglobin A1C 6.3 (A) 4.0 - 5.6 %   HbA1c POC (<> result, manual entry)     HbA1c, POC (prediabetic range)     HbA1c, POC (controlled diabetic range)  The 10-year ASCVD risk score (Arnett DK, et al., 2019) is: 28%    Assessment & Plan:   Problem List Items Addressed This Visit       Cardiovascular and Mediastinum   Essential hypertension    Well-controlled on metoprolol 25 mg twice daily.  Continue medication as prescribed        Respiratory   Acute non-recurrent pansinusitis - Primary    Patient has many antibiotic allergies.  Will go with doxycycline as it does cause GI upset.  Patient has a history of ulcerative proctitis with antibiotic use.  Do not think a fluoroquinolone or clindamycin would be appropriate at this juncture      Relevant Medications   doxycycline (VIBRA-TABS) 100 MG tablet     Endocrine   Type 2 diabetes mellitus with hyperglycemia, without long-term current use of insulin ([place])    Patient no longer following with Dr. Kelton Pillar.  A1c 6.3% today.   Patient currently maintained on diet alone continue      Relevant Orders   POCT glycosylated hemoglobin (Hb A1C) (Completed)     Musculoskeletal and Integument   Osteopenia after menopause    Historical diagnosis she was on Forteo in the past and was switched to alendronate which caused the esophagitis.  Patient is concerned that she needs to be on other medications did review last DEXA scan in 2022 her T-score is osteopenia but she does have a high FRAX fracture score.  We did discuss about using Prolia in office information printed out on the medication the patient will let me know.  Encouraged vitamin D and calcium intake        Genitourinary   Recurrent UTI    Has been established and seen by urology.  Currently on Estrace vaginal cream along with preventative Macrobid 50 mg daily.  Continue taking medication as prescribed follow-up with urology as recommended.       Return in about 6 months (around 01/07/2023) for CPE and Labs/ DM recheck .    Romilda Garret, NP

## 2022-07-08 NOTE — Assessment & Plan Note (Signed)
Historical diagnosis she was on Forteo in the past and was switched to alendronate which caused the esophagitis.  Patient is concerned that she needs to be on other medications did review last DEXA scan in 2022 her T-score is osteopenia but she does have a high FRAX fracture score.  We did discuss about using Prolia in office information printed out on the medication the patient will let me know.  Encouraged vitamin D and calcium intake

## 2022-07-08 NOTE — Patient Instructions (Addendum)
Nice to see you today Think about Prolia I have sent an antibiotic to the pharmacy, let me know if you do not get better Follow up with me in 6 months, sooner if you need me  A1C was 6.3%

## 2022-07-24 ENCOUNTER — Other Ambulatory Visit: Payer: Self-pay | Admitting: Urology

## 2022-07-29 ENCOUNTER — Encounter: Payer: Self-pay | Admitting: Internal Medicine

## 2022-07-30 DIAGNOSIS — L578 Other skin changes due to chronic exposure to nonionizing radiation: Secondary | ICD-10-CM | POA: Diagnosis not present

## 2022-07-30 DIAGNOSIS — L821 Other seborrheic keratosis: Secondary | ICD-10-CM | POA: Diagnosis not present

## 2022-07-30 DIAGNOSIS — B372 Candidiasis of skin and nail: Secondary | ICD-10-CM | POA: Diagnosis not present

## 2022-07-30 DIAGNOSIS — Z85828 Personal history of other malignant neoplasm of skin: Secondary | ICD-10-CM | POA: Diagnosis not present

## 2022-07-30 DIAGNOSIS — Z872 Personal history of diseases of the skin and subcutaneous tissue: Secondary | ICD-10-CM | POA: Diagnosis not present

## 2022-07-30 DIAGNOSIS — D485 Neoplasm of uncertain behavior of skin: Secondary | ICD-10-CM | POA: Diagnosis not present

## 2022-07-30 DIAGNOSIS — L570 Actinic keratosis: Secondary | ICD-10-CM | POA: Diagnosis not present

## 2022-08-05 ENCOUNTER — Ambulatory Visit: Payer: BLUE CROSS/BLUE SHIELD | Admitting: Urology

## 2022-08-05 ENCOUNTER — Encounter: Payer: Self-pay | Admitting: Urology

## 2022-08-05 VITALS — BP 135/79 | HR 97 | Ht 64.0 in | Wt 189.0 lb

## 2022-08-05 DIAGNOSIS — Z87440 Personal history of urinary (tract) infections: Secondary | ICD-10-CM | POA: Diagnosis not present

## 2022-08-05 DIAGNOSIS — N390 Urinary tract infection, site not specified: Secondary | ICD-10-CM

## 2022-08-05 LAB — URINALYSIS, COMPLETE
Bilirubin, UA: NEGATIVE
Glucose, UA: NEGATIVE
Nitrite, UA: NEGATIVE
RBC, UA: NEGATIVE
Specific Gravity, UA: 1.025 (ref 1.005–1.030)
Urobilinogen, Ur: 0.2 mg/dL (ref 0.2–1.0)
pH, UA: 5.5 (ref 5.0–7.5)

## 2022-08-05 LAB — MICROSCOPIC EXAMINATION

## 2022-08-05 NOTE — Progress Notes (Signed)
08/05/2022 1:49 PM   Brittney Pierce 1950/10/22 [id number]  Referring provider: Michela Pitcher, NP [place],  [place] [address]  Chief Complaint  Patient presents with   Recurrent UTI    HPI: 72 y.o. female presents for a 3 month follow-up.  Initially seen 05/04/2022 for recurrent UTIs Elected to start low-dose vaginal estrogen, d-mannose and was placed on 90-day low-dose antibiotic suppression Denies recurrent UTI or UTI symptoms since her last visit Completed her antibiotic course 1 week ago No complaints today   PMH: Past Medical History:  Diagnosis Date   Disorder of bone and cartilage, unspecified    Dysuria    Esophageal reflux    Lumbago    Osteoarthrosis, unspecified whether generalized or localized, unspecified site    Ulcerative colitis, unspecified    Unspecified essential hypertension     Surgical History: Past Surgical History:  Procedure Laterality Date   CATARACT EXTRACTION, BILATERAL     08/2019 and 09/2019   CESAREAN SECTION     x2   [place] SURGERY  2008    Home Medications:  Allergies as of 08/05/2022       Reactions   Amoxicillin Diarrhea   Cephalexin    Prednisone Other (See Comments)   Sx's of heart attack - elevated BP, HR and chest tightness/pressure.   Bactrim Ds [sulfamethoxazole-trimethoprim] Other (See Comments)   Rash,itching,whelps, throat swelling and feels like lump in throat.   Codeine    REACTION: stomach upset   Doxycycline    GI side effects   Erythromycin    REACTION: stomach upset   Fosamax [alendronate] Other (See Comments)   Throat felt raw.   Sulfamethizole Hives        Medication List        Accurate as of August 05, 2022  1:49 PM. If you have any questions, ask your nurse or doctor.          BIOTIN PO Take 1 capsule by mouth daily.   Calcium 1200-1000 MG-UNIT Chew Chew 1 tablet by mouth daily.   cetirizine 10 MG tablet Commonly known as:  ZYRTEC Take 10 mg by mouth daily.   Cranberry-Vitamin C-Vitamin E 4200-20-3 MG-MG-UNIT Caps Take by mouth. 1 daily   Crestor 5 MG tablet Generic drug: rosuvastatin TAKE ONE TABLET BY MOUTH EVERY DAY   D-MANNOSE PO Take 1,000 mg by mouth daily.   diclofenac sodium 1 % Gel Commonly known as: VOLTAREN Apply topically as needed.   estradiol 0.1 MG/GM vaginal cream Commonly known as: ESTRACE Apply a pea-sized amount to fingertip and wipe in vaginal introitus twice weekly   fluticasone 50 MCG/ACT nasal spray Commonly known as: FLONASE 2 sprays each nostril as directed.   meloxicam 15 MG tablet Commonly known as: MOBIC Take 1 tablet (15 mg total) by mouth daily. with food   mesalamine 1000 MG suppository Commonly known as: CANASA PLACE 1 SUPPOSITORY (1,000 MG TOTAL) RECTALLY AT BEDTIME AS NEEDED.   metoprolol tartrate 25 MG tablet Commonly known as: LOPRESSOR Take 0.5 tablets (12.5 mg total) by mouth 2 (two) times daily.   MULTIVITAMIN PO Take 1 tablet by mouth daily.   NexIUM 40 MG capsule Generic drug: esomeprazole TAKE ONE CAPSULE BY MOUTH EVERY DAY - TAKE AT LEAST 1 HOUR BEFORE A MEAL   nitrofurantoin 50 MG capsule Commonly known as: MACRODANTIN Take 1 capsule (50 mg total) by mouth daily.   POTASSIUM GLUCONATE PO Take 1  capsule by mouth daily.   pregabalin 75 MG capsule Commonly known as: LYRICA Take 75 mg by mouth 2 (two) times daily.   PROBIOTIC DAILY PO Take by mouth.        Allergies:  Allergies  Allergen Reactions   Amoxicillin Diarrhea   Cephalexin    Prednisone Other (See Comments)    Sx's of heart attack - elevated BP, HR and chest tightness/pressure.   Bactrim Ds [Sulfamethoxazole-Trimethoprim] Other (See Comments)    Rash,itching,whelps, throat swelling and feels like lump in throat.   Codeine     REACTION: stomach upset   Doxycycline     GI side effects   Erythromycin     REACTION: stomach upset   Fosamax [Alendronate] Other (See  Comments)    Throat felt raw.   Sulfamethizole Hives    Family History: Family History  Problem Relation Age of Onset   Heart attack Father 41   Diabetes Father    Lung cancer Mother     Social History:  reports that she quit smoking about 42 years ago. Her smoking use included cigarettes. She has a 13.00 pack-year smoking history. She has never used smokeless tobacco. She reports current alcohol use. She reports that she does not use drugs.   Physical Exam: BP 135/79   Pulse 97   Ht '5\' 4"'$  (1.626 m)   Wt 189 lb (85.7 kg)   BMI 32.44 kg/m   Constitutional:  Alert and oriented, No acute distress. HEENT: [place] AT Respiratory: Normal respiratory effort, no increased work of breathing. Psychiatric: Normal mood and affect.  Laboratory Data:  Urinalysis Pending   Assessment & Plan:    1. Recurrent UTI Has been on d-mannose and low-dose vaginal estrogen x 3 months and will discontinue low-dose antibiotic suppression Follow-up 1 year and instructed to call as needed for recurrent UTI symptoms   Abbie Sons, MD  [place] [address] [place], [place] [address] [phone]

## 2022-08-07 ENCOUNTER — Encounter: Payer: Self-pay | Admitting: Urology

## 2022-09-08 DIAGNOSIS — M419 Scoliosis, unspecified: Secondary | ICD-10-CM | POA: Diagnosis not present

## 2022-09-08 DIAGNOSIS — M5136 Other intervertebral disc degeneration, lumbar region: Secondary | ICD-10-CM | POA: Diagnosis not present

## 2022-09-13 ENCOUNTER — Encounter: Payer: Self-pay | Admitting: Nurse Practitioner

## 2022-09-16 NOTE — Telephone Encounter (Signed)
History of osteoporsis. Like to start prolia

## 2022-09-17 ENCOUNTER — Telehealth: Payer: Self-pay

## 2022-09-17 NOTE — Telephone Encounter (Signed)
Initiate Prolia

## 2022-09-18 NOTE — Telephone Encounter (Signed)
Prolia VOB initiated via [email or web address] 

## 2022-09-27 ENCOUNTER — Other Ambulatory Visit: Payer: Self-pay | Admitting: Nurse Practitioner

## 2022-09-27 ENCOUNTER — Encounter: Payer: Self-pay | Admitting: Nurse Practitioner

## 2022-09-27 DIAGNOSIS — Z760 Encounter for issue of repeat prescription: Secondary | ICD-10-CM

## 2022-09-28 ENCOUNTER — Other Ambulatory Visit: Payer: Self-pay | Admitting: Nurse Practitioner

## 2022-09-28 DIAGNOSIS — Z760 Encounter for issue of repeat prescription: Secondary | ICD-10-CM

## 2022-09-28 DIAGNOSIS — E1165 Type 2 diabetes mellitus with hyperglycemia: Secondary | ICD-10-CM

## 2022-09-28 MED ORDER — ROSUVASTATIN CALCIUM 5 MG PO TABS: 5.0000 mg | ORAL_TABLET | Freq: Every day | ORAL | 1 refills | Status: DC

## 2022-09-28 MED ORDER — MELOXICAM 15 MG PO TABS: 15.0000 mg | ORAL_TABLET | Freq: Every day | ORAL | 0 refills | Status: DC

## 2022-10-02 ENCOUNTER — Other Ambulatory Visit (HOSPITAL_COMMUNITY): Payer: Self-pay

## 2022-10-02 NOTE — Telephone Encounter (Signed)
Pt called returning [facility] call. Told pt Natalie's response. Pt has no questions/concerns. Call back # [phone]

## 2022-10-02 NOTE — Telephone Encounter (Signed)
Placed a call to [facility] for Prolia verification of benefits. Spoke with Nira Conn and she stated [employer] [place] will cover 100% or the patient's responsibility after the primary pays.   Resubmitted in [facility] portal with [facility] information from chart.   Reference # [id number]

## 2022-10-02 NOTE — Telephone Encounter (Addendum)
Left message to return call to our office will update pt on what is going on.

## 2022-10-06 NOTE — Telephone Encounter (Signed)
Following up on this. Do we have auth?

## 2022-10-15 NOTE — Telephone Encounter (Signed)
PA submitted via fax. Status pending

## 2022-10-20 ENCOUNTER — Other Ambulatory Visit (HOSPITAL_COMMUNITY): Payer: Self-pay

## 2022-10-20 NOTE — Telephone Encounter (Signed)
Pt ready for scheduling for Prolia on or after : 10/20/22  Out-of-pocket cost due at time of visit: $0  Primary: [employer] [place] - Medicare Prolia co-insurance: 20% Admin fee co-insurance: 20%  Secondary: [employer] Prolia co-insurance: 100% Admin fee co-insurance: 100%  Medical Benefit Details: Date Benefits were checked: 10/05/22 Deductible: no/ Coinsurance: 100%/ Admin Fee: 100%  Prior Auth: yes PA# [id number] Expiration Date: 10/14/2023   Pharmacy benefit: Copay BC[employer] medical only/unable to price for Champ[employer] If patient wants fill through the pharmacy benefit please send prescription to:  [facility] , and include estimated need by date in rx notes. Pharmacy will ship medication directly to the office.  Patient NOT eligible for Prolia Copay Card. Copay Card can make patient's cost as little as $25. Link to apply: [email or web address]://[email or web address]  ** This summary of benefits is an estimation of the patient's out-of-pocket cost. Exact cost may very based on individual plan coverage.

## 2022-10-27 NOTE — Telephone Encounter (Signed)
Patient has called back would like to have labs done at [facility] on 6/14 and Prolia after. Have scheduled nurse visit for 01/13/23. Updated spread sheet with information.

## 2022-10-27 NOTE — Telephone Encounter (Signed)
Left message to return call to our office.  09/2622

## 2022-11-18 ENCOUNTER — Encounter: Payer: Self-pay | Admitting: Internal Medicine

## 2022-11-19 ENCOUNTER — Encounter: Payer: Self-pay | Admitting: Family Medicine

## 2022-11-19 ENCOUNTER — Encounter: Payer: Self-pay | Admitting: Internal Medicine

## 2022-11-19 ENCOUNTER — Ambulatory Visit (INDEPENDENT_AMBULATORY_CARE_PROVIDER_SITE_OTHER): Payer: Medicare Other | Admitting: Family Medicine

## 2022-11-19 VITALS — BP 130/82 | HR 90 | Temp 97.7°F | Ht 64.0 in | Wt 188.2 lb

## 2022-11-19 DIAGNOSIS — J0140 Acute pansinusitis, unspecified: Secondary | ICD-10-CM

## 2022-11-19 MED ORDER — DOXYCYCLINE HYCLATE 100 MG PO TABS: 100.0000 mg | ORAL_TABLET | Freq: Two times a day (BID) | ORAL | 0 refills | Status: DC

## 2022-11-19 NOTE — Patient Instructions (Addendum)
Can try Cori    cidin HBP instead of decongestant  given history of hypertension.  Mucinex DM twice daily as needed.  Continue flonase 2 sprays per nostril and can add nasal saline.  Complete antibiotics x 7 days.

## 2022-11-19 NOTE — Progress Notes (Signed)
Patient ID: Brittney Pierce, female    DOB: 12/26/1950, 72 y.o.   MRN: [id number]  This visit was conducted in person.  BP 130/82   Pulse 90   Temp 97.7 F (36.5 C) (Temporal)   Ht  (1.626 m)   Wt 188 lb 4 oz (85.4 kg)   SpO2 100%   BMI 32.31 kg/m    CC:  Chief Complaint  Patient presents with   Red Eyes   Facial Tenderness   Nasal Congestion        Decrease Appetite   Buzzing in ears    Subjective:   HPI: Brittney Pierce is a 72 y.o. female patient of Matt Cable's with history of hypertension, type 2 diabetes  presenting on 11/19/2022 for Red Eyes, Facial Tenderness, Nasal Congestion (/), Decrease Appetite, and Buzzing in ears   Symptoms started with  runny nose and congestion for months Symptoms progressed to  facial pain  started in last week.  Redness under left lower eyelid.   She is feeling like her head is shaky and buzzing in ears ( chronic issue but worse lately)  No ear pain, no ST.  No cough  Decreased appetite in last week. No fever, no flu-like symptoms  Mild  SOB, no wheeze.   Feeling more fatigued.   Treated for pansinusitis July 08, 2022 with doxycycline.      On zyrtec and flonase longterm.  Using phenylephrine.  BP Readings from Last 3 Encounters:  11/19/22 130/82  08/05/22 135/79  07/08/22 126/80   Lab Results  Component Value Date   HGBA1C 6.3 (A) 07/08/2022    She feels bad with prednisone taper.  Relevant past medical, surgical, family and social history reviewed and updated as indicated. Interim medical history since our last visit reviewed. Allergies and medications reviewed and updated. Outpatient Medications Prior to Visit  Medication Sig Dispense Refill   BIOTIN PO Take 1 capsule by mouth daily.      Calcium 1200-1000 MG-UNIT CHEW Chew 1 tablet by mouth daily.     cetirizine (ZYRTEC) 10 MG tablet Take 10 mg by mouth daily.     Cranberry-Vitamin C-Vitamin E 4200-20-3 MG-MG-UNIT CAPS Take by  mouth. 1 daily     D-MANNOSE PO Take 1,000 mg by mouth daily.     diclofenac sodium (VOLTAREN) 1 % GEL Apply topically as needed.      estradiol (ESTRACE) 0.1 MG/GM vaginal cream Apply a pea-sized amount to fingertip and wipe in vaginal introitus twice weekly 42.5 g 1   fluticasone (FLONASE) 50 MCG/ACT nasal spray 2 sprays each nostril as directed. 48 g 1   hydrocortisone 2.5 % cream Apply 1 Application topically 2 (two) times daily.     meloxicam (MOBIC) 15 MG tablet Take 1 tablet (15 mg total) by mouth daily. with food 90 tablet 0   mesalamine (CANASA) 1000 MG suppository PLACE 1 SUPPOSITORY (1,000 MG TOTAL) RECTALLY AT BEDTIME AS NEEDED. 30 suppository 0   metoprolol tartrate (LOPRESSOR) 25 MG tablet Take 0.5 tablets (12.5 mg total) by mouth 2 (two) times daily. 90 tablet 1   Multiple Vitamins-Minerals (MULTIVITAMIN PO) Take 1 tablet by mouth daily.     NEXIUM 40 MG capsule TAKE ONE CAPSULE BY MOUTH EVERY DAY - TAKE AT LEAST 1 HOUR BEFORE A MEAL 90 capsule 1   nitrofurantoin (MACRODANTIN) 50 MG capsule Take 1 capsule (50 mg total) by mouth daily. 30 capsule 2   POTASSIUM GLUCONATE PO Take 1 capsule  by mouth daily.     pregabalin (LYRICA) 75 MG capsule Take 75 mg by mouth 2 (two) times daily.     Probiotic Product (PROBIOTIC DAILY PO) Take by mouth.     rosuvastatin (CRESTOR) 5 MG tablet Take 1 tablet (5 mg total) by mouth daily. 90 tablet 1   No facility-administered medications prior to visit.     Per HPI unless specifically indicated in ROS section below Review of Systems  Constitutional:  Negative for fatigue and fever.  HENT:  Positive for congestion, sinus pressure and sinus pain. Negative for ear pain and sore throat.   Eyes:  Negative for pain.  Respiratory:  Negative for cough and shortness of breath.   Cardiovascular:  Negative for chest pain, palpitations and leg swelling.  Gastrointestinal:  Negative for abdominal pain.  Genitourinary:  Negative for dysuria and vaginal  bleeding.  Musculoskeletal:  Negative for back pain.  Neurological:  Negative for syncope, light-headedness and headaches.  Psychiatric/Behavioral:  Negative for dysphoric mood.    Objective:  BP 130/82   Pulse 90   Temp 97.7 F (36.5 C) (Temporal)   Ht  (1.626 m)   Wt 188 lb 4 oz (85.4 kg)   SpO2 100%   BMI 32.31 kg/m   Wt Readings from Last 3 Encounters:  11/19/22 188 lb 4 oz (85.4 kg)  08/05/22 189 lb (85.7 kg)  07/08/22 187 lb (84.8 kg)      Physical Exam Constitutional:      General: She is not in acute distress.    Appearance: She is well-developed. She is not ill-appearing or toxic-appearing.  HENT:     Head: Normocephalic.     Right Ear: Hearing, tympanic membrane, ear canal and external ear normal. Tympanic membrane is not erythematous, retracted or bulging.     Left Ear: Hearing, tympanic membrane, ear canal and external ear normal. Tympanic membrane is not erythematous, retracted or bulging.     Nose: Mucosal edema present. No rhinorrhea.     Right Sinus: Maxillary sinus tenderness present. No frontal sinus tenderness.     Left Sinus: Maxillary sinus tenderness present. No frontal sinus tenderness.     Mouth/Throat:     Pharynx: Uvula midline.  Eyes:     General: Lids are normal. Lids are everted, no foreign bodies appreciated.     Conjunctiva/sclera: Conjunctivae normal.     Pupils: Pupils are equal, round, and reactive to light.  Neck:     Thyroid: No thyroid mass or thyromegaly.     Vascular: No carotid bruit.     Trachea: Trachea normal.  Cardiovascular:     Rate and Rhythm: Normal rate and regular rhythm.     Pulses: Normal pulses.     Heart sounds: Normal heart sounds, S1 normal and S2 normal. No murmur heard.    No friction rub. No gallop.  Pulmonary:     Effort: Pulmonary effort is normal. No tachypnea or respiratory distress.     Breath sounds: Normal breath sounds. No decreased breath sounds, wheezing, rhonchi or rales.  Musculoskeletal:      Cervical back: Normal range of motion and neck supple.  Skin:    General: Skin is warm and dry.     Findings: No rash.  Neurological:     Mental Status: She is alert.  Psychiatric:        Mood and Affect: Mood is not anxious or depressed.        Speech: Speech normal.  Behavior: Behavior normal. Behavior is cooperative.        Judgment: Judgment normal.       Results for orders placed or performed in visit on 08/05/22  Microscopic Examination   Urine  Result Value Ref Range   WBC, UA 6-10 (A) 0 - 5 /hpf   RBC, Urine 0-2 0 - 2 /hpf   Epithelial Cells (non renal) 0-10 0 - 10 /hpf   Bacteria, UA Moderate (A) None seen/Few  Urinalysis, Complete  Result Value Ref Range   Specific Gravity, UA 1.025 1.005 - 1.030   pH, UA 5.5 5.0 - 7.5   Color, UA Yellow Yellow   Appearance Ur Hazy (A) Clear   Leukocytes,UA 1+ (A) Negative   Protein,UA Trace (A) Negative/Trace   Glucose, UA Negative Negative   Ketones, UA Trace (A) Negative   RBC, UA Negative Negative   Bilirubin, UA Negative Negative   Urobilinogen, Ur 0.2 0.2 - 1.0 mg/dL   Nitrite, UA Negative Negative   Microscopic Examination See below:     Assessment and Plan  Acute non-recurrent pansinusitis Assessment & Plan: Acute, initial allergy symptoms now likely with bacterial superinfection. Continue Flonase 2 sprays per nostril daily and allergy medication. Will treat with doxycycline 100 mg twice daily x 7 days.  Looks like she tolerated this the last time so I will remove it from her intolerance list. Encouraged her to avoid decongestant given hypertension and instead use Coricidin, Mucinex DM. She will also start nasal saline spray 2-3 times daily to irrigate sinuses. Return and ER precautions provided.   Other orders -     Doxycycline Hyclate; Take 1 tablet (100 mg total) by mouth 2 (two) times daily.  Dispense: 14 tablet; Refill: 0    No follow-ups on file.   Kerby Nora, MD

## 2022-11-19 NOTE — Assessment & Plan Note (Signed)
Acute, initial allergy symptoms now likely with bacterial superinfection. Continue Flonase 2 sprays per nostril daily and allergy medication. Will treat with doxycycline 100 mg twice daily x 7 days.  Looks like she tolerated this the last time so I will remove it from her intolerance list. Encouraged her to avoid decongestant given hypertension and instead use Coricidin, Mucinex DM. She will also start nasal saline spray 2-3 times daily to irrigate sinuses. Return and ER precautions provided.

## 2022-12-01 ENCOUNTER — Encounter: Payer: Self-pay | Admitting: Nurse Practitioner

## 2022-12-01 ENCOUNTER — Ambulatory Visit (INDEPENDENT_AMBULATORY_CARE_PROVIDER_SITE_OTHER): Payer: Medicare Other | Admitting: Nurse Practitioner

## 2022-12-01 VITALS — BP 134/74 | HR 76 | Temp 97.7°F | Resp 16 | Ht 64.0 in | Wt 186.1 lb

## 2022-12-01 DIAGNOSIS — R4189 Other symptoms and signs involving cognitive functions and awareness: Secondary | ICD-10-CM | POA: Insufficient documentation

## 2022-12-01 DIAGNOSIS — R002 Palpitations: Secondary | ICD-10-CM | POA: Diagnosis not present

## 2022-12-01 DIAGNOSIS — R5383 Other fatigue: Secondary | ICD-10-CM | POA: Diagnosis not present

## 2022-12-01 LAB — COMPREHENSIVE METABOLIC PANEL
ALT: 15 U/L (ref 0–35)
AST: 22 U/L (ref 0–37)
Albumin: 4.1 g/dL (ref 3.5–5.2)
Alkaline Phosphatase: 68 U/L (ref 39–117)
BUN: 13 mg/dL (ref 6–23)
CO2: 29 mEq/L (ref 19–32)
Calcium: 10 mg/dL (ref 8.4–10.5)
Chloride: 97 mEq/L (ref 96–112)
Creatinine, Ser: 1.05 mg/dL (ref 0.40–1.20)
GFR: 52.58 mL/min — ABNORMAL LOW (ref >60.00)
Glucose, Bld: 108 mg/dL — ABNORMAL HIGH (ref 70–99)
Potassium: 4.4 mEq/L (ref 3.5–5.1)
Sodium: 134 mEq/L — ABNORMAL LOW (ref 135–145)
Total Bilirubin: 0.5 mg/dL (ref 0.2–1.2)
Total Protein: 6.7 g/dL (ref 6.0–8.3)

## 2022-12-01 LAB — VITAMIN D 25 HYDROXY (VIT D DEFICIENCY, FRACTURES): VITD: 84.65 ng/mL (ref 30.00–100.00)

## 2022-12-01 LAB — CBC
HCT: 40.5 % (ref 36.0–46.0)
Hemoglobin: 13.8 g/dL (ref 12.0–15.0)
MCHC: 34.1 g/dL (ref 30.0–36.0)
MCV: 88.3 fl (ref 78.0–100.0)
Platelets: 241 10*3/uL (ref 150.0–400.0)
RBC: 4.58 Mil/uL (ref 3.87–5.11)
RDW: 13.5 % (ref 11.5–15.5)
WBC: 8.9 10*3/uL (ref 4.0–10.5)

## 2022-12-01 LAB — VITAMIN B12: Vitamin B-12: 529 pg/mL (ref 211–911)

## 2022-12-01 LAB — TSH: TSH: 2.36 u[IU]/mL (ref 0.35–5.50)

## 2022-12-01 NOTE — Patient Instructions (Signed)
Nice to see you today Brittney Pierce is the pain pill Spinal cord stimulator is the device. I will be in touch with the labs once I reviewed them Keep your appointment with me as scheduled

## 2022-12-01 NOTE — Assessment & Plan Note (Signed)
EKG within normal limits.  Will check basic labs including CBC CMP TSH they compared to previous EKG which did show PACs and PVCs this could be what. This could be what the patient is experiencing she is not followed by cardiology

## 2022-12-01 NOTE — Assessment & Plan Note (Signed)
Unclear in etiology.  Could be post residual from sinus infection.  Patient did finish her doxycycline she does have adverse drug events to lots of medications with talk about the possibility of Augmentin patient does have a history of diarrhea she states she no longer works and if needed she would be willing to try it since it was not anaphylaxis

## 2022-12-01 NOTE — Progress Notes (Signed)
Acute Office Visit  Subjective:     Patient ID: Brittney Pierce, female    DOB: Nov 08, 1950, 72 y.o.   MRN: [id number]  Chief Complaint  Patient presents with   Fatigue    Feeling woozy    Palpitations    Felt like her heart was beating fast last night     Patient is in today for multiple complaints with a history of  htn, UC, DM2,  Patient was seen on 11/19/2022 by Kerby Nora and dx with sinusiits and placed on doxycyline  States that the sinus feels better. States that she never stopped having wooziness. States that she is having fatigue that was presistent. States that her back pain has increased a lot and that is causing her to not sleep well. States that she can use a lidocaine patch that does help. States on Sunday she decided to half the lyrica dose and took 1 tab of lyrica vs the 2 tablets. States that when she woke up she felt better and less wozey   Palpitations: states that last night she could not sleep. States that she felt that her heart was racing and pounding. When things like this happens it also makes her anxious   States that she has done a life screenigng that showed moderate build up in her carotids and that has also been on her mind   Review of Systems  Constitutional:  Positive for malaise/fatigue. Negative for chills and fever.  Respiratory:  Negative for cough and shortness of breath.   Cardiovascular:  Positive for palpitations.        Objective:    BP 134/74   Pulse 76   Temp 97.7 F (36.5 C)   Resp 16   Ht 5\' 4"  (1.626 m)   Wt 186 lb 2 oz (84.4 kg)   SpO2 99%   BMI 31.95 kg/m    Physical Exam Vitals and nursing note reviewed.  Constitutional:      Appearance: Normal appearance.  HENT:     Right Ear: Tympanic membrane, ear canal and external ear normal.     Left Ear: Tympanic membrane, ear canal and external ear normal.     Nose:     Right Sinus: Maxillary sinus tenderness and frontal sinus tenderness present.     Left  Sinus: Maxillary sinus tenderness and frontal sinus tenderness present.     Mouth/Throat:     Mouth: Mucous membranes are dry.     Pharynx: No posterior oropharyngeal erythema.  Neck:     Vascular: No carotid bruit.  Cardiovascular:     Rate and Rhythm: Normal rate. Rhythm irregular.     Heart sounds: Normal heart sounds.  Pulmonary:     Effort: Pulmonary effort is normal.     Breath sounds: Normal breath sounds.  Lymphadenopathy:     Cervical: No cervical adenopathy.  Neurological:     Mental Status: She is alert.     No results found for any visits on 12/01/22.      Assessment & Plan:   Problem List Items Addressed This Visit       Other   Palpitations - Primary    EKG within normal limits.  Will check basic labs including CBC CMP TSH they compared to previous EKG which did show PACs and PVCs this could be what. This could be what the patient is experiencing she is not followed by cardiology      Relevant Orders   EKG 12-Lead (Completed)  CBC   Comprehensive metabolic panel   TSH   Brain fog    Unclear in etiology.  Could be post residual from sinus infection.  Patient did finish her doxycycline she does have adverse drug events to lots of medications with talk about the possibility of Augmentin patient does have a history of diarrhea she states she no longer works and if needed she would be willing to try it since it was not anaphylaxis      Fatigue    Ambiguous in nature will check basic labs inclusive of CBC CMP TSH B12 vitamin D.  Some of the fatigue could be coming from poor sleep she does have a follow-up with her spine clinic coming up.      Relevant Orders   CBC   Vitamin B12   Comprehensive metabolic panel   TSH   VITAMIN D 25 Hydroxy (Vit-D Deficiency, Fractures)    No orders of the defined types were placed in this encounter.   Return if symptoms worsen or fail to improve, for As scheduled .  Audria Nine, NP

## 2022-12-01 NOTE — Assessment & Plan Note (Signed)
Ambiguous in nature will check basic labs inclusive of CBC CMP TSH B12 vitamin D.  Some of the fatigue could be coming from poor sleep she does have a follow-up with her spine clinic coming up.

## 2022-12-04 ENCOUNTER — Encounter: Payer: Self-pay | Admitting: Nurse Practitioner

## 2022-12-04 DIAGNOSIS — Z760 Encounter for issue of repeat prescription: Secondary | ICD-10-CM

## 2022-12-04 MED ORDER — MELOXICAM 15 MG PO TABS: 15.0000 mg | ORAL_TABLET | Freq: Every day | ORAL | 0 refills | Status: DC

## 2022-12-04 MED ORDER — ESOMEPRAZOLE MAGNESIUM 40 MG PO CPDR: DELAYED_RELEASE_CAPSULE | ORAL | 1 refills | Status: DC

## 2022-12-04 MED ORDER — METOPROLOL TARTRATE 25 MG PO TABS
12.5000 mg | ORAL_TABLET | Freq: Two times a day (BID) | ORAL | 1 refills | Status: DC
Start: 2022-12-04 — End: 2022-12-07

## 2022-12-07 ENCOUNTER — Other Ambulatory Visit: Payer: Self-pay

## 2022-12-07 DIAGNOSIS — Z760 Encounter for issue of repeat prescription: Secondary | ICD-10-CM

## 2022-12-07 MED ORDER — MELOXICAM 15 MG PO TABS
15.0000 mg | ORAL_TABLET | Freq: Every day | ORAL | 0 refills | Status: DC
Start: 2022-12-07 — End: 2023-04-02

## 2022-12-07 MED ORDER — ESOMEPRAZOLE MAGNESIUM 40 MG PO CPDR
DELAYED_RELEASE_CAPSULE | ORAL | 1 refills | Status: DC
Start: 2022-12-07 — End: 2023-06-03

## 2022-12-07 MED ORDER — METOPROLOL TARTRATE 25 MG PO TABS
12.5000 mg | ORAL_TABLET | Freq: Two times a day (BID) | ORAL | 1 refills | Status: DC
Start: 2022-12-07 — End: 2023-05-26

## 2022-12-07 NOTE — Telephone Encounter (Signed)
Resent to the correct pharmacy. Rx sent to the wrong pharmacy will call and cancel.

## 2022-12-10 ENCOUNTER — Encounter: Payer: Self-pay | Admitting: Urology

## 2022-12-11 ENCOUNTER — Other Ambulatory Visit: Payer: Self-pay | Admitting: Family Medicine

## 2022-12-11 MED ORDER — PREMARIN 0.625 MG/GM VA CREA: TOPICAL_CREAM | VAGINAL | 12 refills | Status: DC

## 2022-12-11 NOTE — Telephone Encounter (Signed)
Riki Altes, MD  You19 minutes ago (3:32 PM)    Please verify with that she should be applying 2 times per week and not 2 times per day.  If she is applying daily then have her changed to 2 times weekly.  If she is applying 2 times weekly can send in an Rx of Premarin cream (same dose of 2 times weekly) however this medication may be more expensive.  Spoke to patient and she was applying cream 2 times per week.The rx was changed to premarin.

## 2022-12-14 DIAGNOSIS — M5136 Other intervertebral disc degeneration, lumbar region: Secondary | ICD-10-CM | POA: Diagnosis not present

## 2022-12-14 DIAGNOSIS — M419 Scoliosis, unspecified: Secondary | ICD-10-CM | POA: Diagnosis not present

## 2023-01-08 ENCOUNTER — Telehealth: Payer: Self-pay

## 2023-01-08 ENCOUNTER — Encounter: Payer: Self-pay | Admitting: Nurse Practitioner

## 2023-01-08 ENCOUNTER — Ambulatory Visit (INDEPENDENT_AMBULATORY_CARE_PROVIDER_SITE_OTHER): Payer: Medicare Other | Admitting: Nurse Practitioner

## 2023-01-08 VITALS — BP 128/74 | HR 82 | Temp 97.6°F | Resp 16 | Ht 63.0 in | Wt 181.5 lb

## 2023-01-08 DIAGNOSIS — M810 Age-related osteoporosis without current pathological fracture: Secondary | ICD-10-CM

## 2023-01-08 DIAGNOSIS — M545 Low back pain, unspecified: Secondary | ICD-10-CM

## 2023-01-08 DIAGNOSIS — I10 Essential (primary) hypertension: Secondary | ICD-10-CM

## 2023-01-08 DIAGNOSIS — G8929 Other chronic pain: Secondary | ICD-10-CM

## 2023-01-08 DIAGNOSIS — E1165 Type 2 diabetes mellitus with hyperglycemia: Secondary | ICD-10-CM | POA: Diagnosis not present

## 2023-01-08 DIAGNOSIS — K219 Gastro-esophageal reflux disease without esophagitis: Secondary | ICD-10-CM

## 2023-01-08 DIAGNOSIS — Z7984 Long term (current) use of oral hypoglycemic drugs: Secondary | ICD-10-CM

## 2023-01-08 DIAGNOSIS — K5100 Ulcerative (chronic) pancolitis without complications: Secondary | ICD-10-CM

## 2023-01-08 DIAGNOSIS — F418 Other specified anxiety disorders: Secondary | ICD-10-CM

## 2023-01-08 DIAGNOSIS — Z0000 Encounter for general adult medical examination without abnormal findings: Secondary | ICD-10-CM | POA: Diagnosis not present

## 2023-01-08 LAB — MICROALBUMIN / CREATININE URINE RATIO
Creatinine,U: 171.9 mg/dL
Microalb Creat Ratio: 0.5 mg/g (ref 0.0–30.0)
Microalb, Ur: 0.8 mg/dL (ref 0.0–1.9)

## 2023-01-08 LAB — BASIC METABOLIC PANEL
BUN: 11 mg/dL (ref 6–23)
CO2: 27 mEq/L (ref 19–32)
Calcium: 9.8 mg/dL (ref 8.4–10.5)
Chloride: 98 mEq/L (ref 96–112)
Creatinine, Ser: 1 mg/dL (ref 0.40–1.20)
GFR: 55.71 mL/min — ABNORMAL LOW (ref >60.00)
Glucose, Bld: 113 mg/dL — ABNORMAL HIGH (ref 70–99)
Potassium: 4.1 mEq/L (ref 3.5–5.1)
Sodium: 133 mEq/L — ABNORMAL LOW (ref 135–145)

## 2023-01-08 LAB — LIPID PANEL
Cholesterol: 126 mg/dL (ref 0–200)
HDL: 54.7 mg/dL (ref >39.00)
LDL Cholesterol: 40 mg/dL (ref 0–99)
NonHDL: 71.4
Total CHOL/HDL Ratio: 2
Triglycerides: 157 mg/dL — ABNORMAL HIGH (ref 0.0–149.0)
VLDL: 31.4 mg/dL (ref 0.0–40.0)

## 2023-01-08 LAB — VITAMIN D 25 HYDROXY (VIT D DEFICIENCY, FRACTURES): VITD: 111.39 ng/mL (ref 30.00–100.00)

## 2023-01-08 MED ORDER — HYDROXYZINE HCL 10 MG PO TABS
10.0000 mg | ORAL_TABLET | Freq: Three times a day (TID) | ORAL | 0 refills | Status: AC | PRN
Start: 2023-01-08 — End: ?

## 2023-01-08 NOTE — Telephone Encounter (Signed)
Hope with [facility] called critical lab for vitamin D 111.39. sending note to Audria Nine NP and [place] pool.

## 2023-01-08 NOTE — Assessment & Plan Note (Signed)
History of the same can use mesalamine suppositories on an as-needed basis.  Stable

## 2023-01-08 NOTE — Assessment & Plan Note (Signed)
Patient currently maintained on Nexium 20 mg.  Well-controlled her symptoms continue the same

## 2023-01-08 NOTE — Assessment & Plan Note (Signed)
Discussed age-appropriate immunizations and screening exams.  Reviewed patient's personal, surgical, social, family histories.  Patient up-to-date on all age-appropriate vaccinations.  Patient declined CRC screening is aged out of cervical cancer screening and declines breast cancer screening.  Patient was given information at discharge about preventative healthcare maintenance with anticipatory guidance

## 2023-01-08 NOTE — Telephone Encounter (Signed)
Called and pt stated that she would cut it out until she seen you at her 6 month follow up.

## 2023-01-08 NOTE — Assessment & Plan Note (Signed)
Patient currently on metoprolol.  Blood pressure normal limits.  Continue medication as prescribed

## 2023-01-08 NOTE — Assessment & Plan Note (Signed)
History of the same.  Patient used to be on medication unsure what was called.  Patient currently on tramadol and Lyrica will avoid benzodiazepines.  We can try hydroxyzine 10 mg 3 times daily as needed sedation precautions reviewed

## 2023-01-08 NOTE — Progress Notes (Signed)
Established Patient Office Visit  Subjective   Patient ID: Brittney Pierce, female    DOB: [birth year]  Age: 72 y.o. MRN: [id number]  Chief Complaint  Patient presents with   Annual Exam    HPI  HTN: she is on metprolol and tolerating it well   GERD: states that she does not eat anything after 8 and does well with her heartburn    DM2: patient is controlled on lifestyle modifications  for complete physical and follow up of chronic conditions.  Immunizations: -Tetanus: Completed in 2021 -Influenza: Completed this season -Shingles: Completed Shingrix series -Pneumonia: Completed 2023  Diet: Fair diet. States that she is doing 2 meals a day. Some snacking but little per her report. Mostly water and coke zero  Exercise: No regular exercise.  Eye exam: Completes annually. Reading glasses   Dental exam: full dentures. Dentist prn     Colonoscopy: Completed in  2010. Does not want ifob nor colonoscopy.  Lung Cancer Screening: na   Pap smear:  Aged out   Mammogram: patient declines  DEXA: 04/2021, needs updating in 05/2023. Order placed today   Sleep: states she goes to bed late and has trounle sleeping at night due to her back. she is doing lidocaine 5% patches at night for her back    Review of Systems  Constitutional:  Negative for chills and fever.  Respiratory:  Negative for shortness of breath.   Cardiovascular:  Negative for chest pain and leg swelling.  Gastrointestinal:  Negative for abdominal pain, blood in stool, constipation, diarrhea, nausea and vomiting.       Bm daily   Genitourinary:  Negative for dysuria and hematuria.  Musculoskeletal:  Positive for back pain.  Neurological:  Positive for tingling. Negative for headaches.  Psychiatric/Behavioral:  Negative for hallucinations and suicidal ideas. The patient is nervous/anxious.       Objective:     BP 128/74   Pulse 82   Temp 97.6 F (36.4 C)   Resp 16   Ht 5\' 3"  (1.6 m)   Wt 181  lb 8 oz (82.3 kg)   SpO2 97%   BMI 32.15 kg/m  BP Readings from Last 3 Encounters:  01/08/23 128/74  12/01/22 134/74  11/19/22 130/82   Wt Readings from Last 3 Encounters:  01/08/23 181 lb 8 oz (82.3 kg)  12/01/22 186 lb 2 oz (84.4 kg)  11/19/22 188 lb 4 oz (85.4 kg)      Physical Exam Vitals and nursing note reviewed.  Constitutional:      Appearance: Normal appearance.  HENT:     Right Ear: Tympanic membrane, ear canal and external ear normal.     Left Ear: Tympanic membrane, ear canal and external ear normal.     Mouth/Throat:     Mouth: Mucous membranes are moist.     Pharynx: Oropharynx is clear.  Eyes:     Extraocular Movements: Extraocular movements intact.     Pupils: Pupils are equal, round, and reactive to light.  Cardiovascular:     Rate and Rhythm: Normal rate and regular rhythm.     Pulses: Normal pulses.     Heart sounds: Normal heart sounds.  Pulmonary:     Effort: Pulmonary effort is normal.     Breath sounds: Normal breath sounds.  Abdominal:     General: Bowel sounds are normal. There is no distension.     Palpations: There is no mass.     Tenderness: There is no abdominal  tenderness.     Hernia: No hernia is present.  Musculoskeletal:     Right lower leg: No edema.     Left lower leg: No edema.  Lymphadenopathy:     Cervical: No cervical adenopathy.  Skin:    General: Skin is warm.  Neurological:     General: No focal deficit present.     Mental Status: She is alert.     Deep Tendon Reflexes:     Reflex Scores:      Bicep reflexes are 2+ on the right side and 2+ on the left side.      Patellar reflexes are 2+ on the right side and 2+ on the left side.    Comments: Bilateral upper and lower extremity strength 5/5  Psychiatric:        Mood and Affect: Mood normal.        Behavior: Behavior normal.        Thought Content: Thought content normal.        Judgment: Judgment normal.      No results found for any visits on  01/08/23.    The 10-year ASCVD risk score (Arnett DK, et al., 2019) is: 28.7%    Assessment & Plan:   Problem List Items Addressed This Visit       Cardiovascular and Mediastinum   Essential hypertension    Patient currently on metoprolol.  Blood pressure normal limits.  Continue medication as prescribed      Relevant Orders   Microalbumin / creatinine urine ratio   Basic metabolic panel     Digestive   GERD    Patient currently maintained on Nexium 20 mg.  Well-controlled her symptoms continue the same      Ulcerative colitis (HCC)    History of the same can use mesalamine suppositories on an as-needed basis.  Stable        Endocrine   Type 2 diabetes mellitus with hyperglycemia, without long-term current use of insulin (HCC)    Patient currently maintained on diet and lifestyle modifications only.  Pending A1c today      Relevant Orders   Microalbumin / creatinine urine ratio   Lipid panel     Musculoskeletal and Integument   Age-related osteoporosis without current pathological fracture    History of the same.  Patient currently on Prolia every 6 months.  DEXA scan up-to-date will be due within the year order placed today      Relevant Orders   DG Bone Density   VITAMIN D 25 Hydroxy (Vit-D Deficiency, Fractures)     Other   BACK PAIN, LUMBAR, CHRONIC    Patient is followed by spine specialist in [place].  Manage Lyrica and tramadol.  Continue medication as prescribed patient also using lidocaine 5% patches at bedtime      Preventative health care - Primary    Discussed age-appropriate immunizations and screening exams.  Reviewed patient's personal, surgical, social, family histories.  Patient up-to-date on all age-appropriate vaccinations.  Patient declined CRC screening is aged out of cervical cancer screening and declines breast cancer screening.  Patient was given information at discharge about preventative healthcare maintenance with anticipatory  guidance      Relevant Orders   Basic metabolic panel   Situational anxiety    History of the same.  Patient used to be on medication unsure what was called.  Patient currently on tramadol and Lyrica will avoid benzodiazepines.  We can try hydroxyzine 10 mg 3 times daily  as needed sedation precautions reviewed      Relevant Medications   hydrOXYzine (ATARAX) 10 MG tablet    Return in about 6 months (around 07/10/2023) for DM recheck.    Audria Nine, NP

## 2023-01-08 NOTE — Assessment & Plan Note (Signed)
Patient is followed by spine specialist in [place].  Manage Lyrica and tramadol.  Continue medication as prescribed patient also using lidocaine 5% patches at bedtime

## 2023-01-08 NOTE — Telephone Encounter (Signed)
Can we call and see what vitamin D she is on and tell her to discontinue it for the next month

## 2023-01-08 NOTE — Patient Instructions (Signed)
Call and schedule the DEXA scan for November I will be in touch with the labs once I have them Follow up with me in 1 month, sooner if you need me

## 2023-01-08 NOTE — Assessment & Plan Note (Signed)
Patient currently maintained on diet and lifestyle modifications only.  Pending A1c today

## 2023-01-08 NOTE — Assessment & Plan Note (Signed)
History of the same.  Patient currently on Prolia every 6 months.  DEXA scan up-to-date will be due within the year order placed today

## 2023-01-13 ENCOUNTER — Ambulatory Visit (INDEPENDENT_AMBULATORY_CARE_PROVIDER_SITE_OTHER): Payer: Medicare Other

## 2023-01-13 DIAGNOSIS — M810 Age-related osteoporosis without current pathological fracture: Secondary | ICD-10-CM | POA: Diagnosis not present

## 2023-01-13 MED ORDER — DENOSUMAB 60 MG/ML ~~LOC~~ SOSY
60.0000 mg | PREFILLED_SYRINGE | Freq: Once | SUBCUTANEOUS | Status: AC
Start: 2023-01-13 — End: 2023-01-13
  Administered 2023-01-13: 60 mg via SUBCUTANEOUS

## 2023-01-13 NOTE — Progress Notes (Signed)
Per orders of Mordecai Maes, NP , injection of prolia 60 mg given by Lewanda Rife in left arm Patient tolerated injection well. Patient will make appointment for 6 month.this was pts first prolia shot and pt waited without problem

## 2023-01-23 ENCOUNTER — Encounter: Payer: Self-pay | Admitting: Nurse Practitioner

## 2023-01-24 ENCOUNTER — Encounter: Payer: Self-pay | Admitting: Nurse Practitioner

## 2023-03-08 DIAGNOSIS — Z9841 Cataract extraction status, right eye: Secondary | ICD-10-CM | POA: Diagnosis not present

## 2023-03-08 DIAGNOSIS — Z9842 Cataract extraction status, left eye: Secondary | ICD-10-CM | POA: Diagnosis not present

## 2023-03-08 DIAGNOSIS — E119 Type 2 diabetes mellitus without complications: Secondary | ICD-10-CM | POA: Diagnosis not present

## 2023-03-08 DIAGNOSIS — H52223 Regular astigmatism, bilateral: Secondary | ICD-10-CM | POA: Diagnosis not present

## 2023-03-08 LAB — HM DIABETES EYE EXAM

## 2023-03-16 DIAGNOSIS — Z79899 Other long term (current) drug therapy: Secondary | ICD-10-CM | POA: Diagnosis not present

## 2023-03-16 DIAGNOSIS — Z79891 Long term (current) use of opiate analgesic: Secondary | ICD-10-CM | POA: Diagnosis not present

## 2023-03-16 DIAGNOSIS — M5136 Other intervertebral disc degeneration, lumbar region: Secondary | ICD-10-CM | POA: Diagnosis not present

## 2023-03-16 DIAGNOSIS — M419 Scoliosis, unspecified: Secondary | ICD-10-CM | POA: Diagnosis not present

## 2023-03-16 DIAGNOSIS — G894 Chronic pain syndrome: Secondary | ICD-10-CM | POA: Diagnosis not present

## 2023-03-24 ENCOUNTER — Other Ambulatory Visit: Payer: Self-pay | Admitting: Nurse Practitioner

## 2023-03-24 DIAGNOSIS — Z760 Encounter for issue of repeat prescription: Secondary | ICD-10-CM

## 2023-03-29 ENCOUNTER — Other Ambulatory Visit: Payer: Self-pay | Admitting: Nurse Practitioner

## 2023-03-29 DIAGNOSIS — Z760 Encounter for issue of repeat prescription: Secondary | ICD-10-CM

## 2023-03-30 ENCOUNTER — Telehealth: Payer: Self-pay | Admitting: Nurse Practitioner

## 2023-03-30 ENCOUNTER — Other Ambulatory Visit: Payer: Self-pay | Admitting: Nurse Practitioner

## 2023-03-30 DIAGNOSIS — Z760 Encounter for issue of repeat prescription: Secondary | ICD-10-CM

## 2023-03-30 DIAGNOSIS — E1165 Type 2 diabetes mellitus with hyperglycemia: Secondary | ICD-10-CM

## 2023-03-30 MED ORDER — ROSUVASTATIN CALCIUM 5 MG PO TABS
5.0000 mg | ORAL_TABLET | Freq: Every day | ORAL | 1 refills | Status: DC
Start: 2023-03-30 — End: 2023-09-20

## 2023-03-30 NOTE — Telephone Encounter (Signed)
Prescription Request  03/30/2023  LOV: 01/08/2023  What is the name of the medication or equipment? meloxicam (MOBIC) 15 MG tablet   Have you contacted your pharmacy to request a refill? No   Which pharmacy would you like this sent to?  [facility] - [place], [place] - [address] [facility] [address] [place] [address] Phone: [phone] Fax: [phone]    Patient notified that their request is being sent to the clinical staff for review and that they should receive a response within 2 business days.   Please advise at Mobile [phone] (mobile)  Patient says this was previously denied as a refill and wanted to know why, patient asked to be notified if this is refilled.

## 2023-04-02 ENCOUNTER — Other Ambulatory Visit: Payer: Self-pay | Admitting: Nurse Practitioner

## 2023-04-02 DIAGNOSIS — Z760 Encounter for issue of repeat prescription: Secondary | ICD-10-CM

## 2023-04-02 MED ORDER — MELOXICAM 15 MG PO TABS
15.0000 mg | ORAL_TABLET | Freq: Every day | ORAL | 0 refills | Status: DC
Start: 2023-04-02 — End: 2023-04-02

## 2023-04-02 NOTE — Telephone Encounter (Signed)
Patient would like to know if medication meloxicam (MOBIC) 15 MG tablet could be resent to  [facility] - [place], [place] - [address] [facility] Phone: [phone]  Fax: [phone]    ,she said that it was sent to [facility].

## 2023-04-02 NOTE — Telephone Encounter (Signed)
Pt requests to send prescription of Meloxicam to [facility] by mail instead of [facility] . Called [facility] pharmacy to cancel the prescription.

## 2023-04-02 NOTE — Telephone Encounter (Signed)
LAST APPOINTMENT DATE: 01/08/2023   NEXT APPOINTMENT DATE: 08/18/2023   Meloxicam Mobic 15mg  LAST REFILL: 12/07/2022  QTY: #[phone]

## 2023-04-06 ENCOUNTER — Other Ambulatory Visit: Payer: Self-pay | Admitting: Nurse Practitioner

## 2023-04-06 DIAGNOSIS — Z760 Encounter for issue of repeat prescription: Secondary | ICD-10-CM

## 2023-04-06 MED ORDER — MELOXICAM 15 MG PO TABS
15.0000 mg | ORAL_TABLET | Freq: Every day | ORAL | 0 refills | Status: DC
Start: 2023-04-06 — End: 2023-06-22

## 2023-04-09 ENCOUNTER — Encounter: Payer: Self-pay | Admitting: Nurse Practitioner

## 2023-04-12 ENCOUNTER — Ambulatory Visit: Payer: Medicare Other | Attending: Cardiology | Admitting: Cardiology

## 2023-04-12 ENCOUNTER — Encounter: Payer: Self-pay | Admitting: Cardiology

## 2023-04-12 VITALS — BP 144/90 | HR 79 | Ht 64.0 in | Wt 182.4 lb

## 2023-04-12 DIAGNOSIS — R002 Palpitations: Secondary | ICD-10-CM

## 2023-04-12 DIAGNOSIS — R072 Precordial pain: Secondary | ICD-10-CM | POA: Diagnosis not present

## 2023-04-12 DIAGNOSIS — I10 Essential (primary) hypertension: Secondary | ICD-10-CM | POA: Diagnosis not present

## 2023-04-12 MED ORDER — METOPROLOL TARTRATE 100 MG PO TABS: 100.0000 mg | ORAL_TABLET | Freq: Once | ORAL | 0 refills | Status: DC

## 2023-04-12 NOTE — Progress Notes (Signed)
Cardiology Office Note:    Date:  04/12/2023   ID:  Brittney Pierce, DOB [birth year], MRN [id number]  PCP:  Eden Emms, NP   [facility] Providers Cardiologist:  Debbe Odea, MD     Referring MD: Eden Emms, NP   Chief Complaint  Patient presents with   New Patient (Initial Visit)    Self referred for cardiac evaluation of Palpitations with no cardiac history.  Concerned with palpitations.  Reports an episode that lasted 4 hours on 02/05/23 with mid chest pain that radiated to back.  Intermittent sharp upper left side back pain.    History of Present Illness:    Brittney Pierce is a 72 y.o. female with a hx of hypertension, remote smoking history x 12 years, degenerative disc disease, chronic back pain presenting with chest pain and palpitations.  Had an episode of chest pain 2 months ago while sitting at home.  Was with her cats and did not want to leave them, did not go to the ED.  Had 2 prior episodes of chest pain not related with exertion, 2018, 2021 when she presented to the ED, was told her symptoms were likely due to anxiety after workup in the ED was unrevealing.  Notes having occasional palpitations which she attributes to stress.  Last episode was about 5 months ago.  Hypertension adequately managed with metoprolol 12.5 mg twice daily.  Past Medical History:  Diagnosis Date   DDD (degenerative disc disease), lumbar    Disorder of bone and cartilage, unspecified    Dysuria    Esophageal reflux    Lumbago    Osteoarthrosis, unspecified whether generalized or localized, unspecified site    Scoliosis    Ulcerative colitis, unspecified    Unspecified essential hypertension     Past Surgical History:  Procedure Laterality Date   CATARACT EXTRACTION, BILATERAL     08/2019 and 09/2019   CESAREAN SECTION     x2   CHOLECYSTECTOMY     LUMBAR DISC SURGERY  2008    Current Medications: Current Meds  Medication Sig   BIOTIN PO  Take 1 capsule by mouth daily.    Calcium 1200-1000 MG-UNIT CHEW Chew 1 tablet by mouth daily.   cetirizine (ZYRTEC) 10 MG tablet Take 10 mg by mouth daily.   conjugated estrogens (PREMARIN) vaginal cream Use a pea sized amount on the urethra 2 times per week.   Cranberry-Vitamin C-Vitamin E 4200-20-3 MG-MG-UNIT CAPS Take by mouth. 1 daily   D-MANNOSE PO Take 1,000 mg by mouth daily.   diclofenac sodium (VOLTAREN) 1 % GEL Apply topically as needed.    esomeprazole (NEXIUM) 40 MG capsule TAKE ONE CAPSULE BY MOUTH EVERY DAY - TAKE AT LEAST 1 HOUR BEFORE A MEAL   fluticasone (FLONASE) 50 MCG/ACT nasal spray 2 sprays each nostril as directed.   hydrocortisone 2.5 % cream Apply 1 Application topically 2 (two) times daily.   hydrOXYzine (ATARAX) 10 MG tablet Take 1 tablet (10 mg total) by mouth 3 (three) times daily as needed.   meloxicam (MOBIC) 15 MG tablet Take 1 tablet (15 mg total) by mouth daily. with food   mesalamine (CANASA) 1000 MG suppository PLACE 1 SUPPOSITORY (1,000 MG TOTAL) RECTALLY AT BEDTIME AS NEEDED.   metoprolol tartrate (LOPRESSOR) 100 MG tablet Take 1 tablet (100 mg total) by mouth once for 1 dose. TWO HOURS PRIOR TO CARDIAC CTA   metoprolol tartrate (LOPRESSOR) 25 MG tablet Take 0.5 tablets (12.5 mg total)  by mouth 2 (two) times daily.   Multiple Vitamins-Minerals (MULTIVITAMIN PO) Take 1 tablet by mouth daily.   POTASSIUM GLUCONATE PO Take 1 capsule by mouth daily.   pregabalin (LYRICA) 75 MG capsule Take 75 mg by mouth at bedtime.   Probiotic Product (PROBIOTIC DAILY PO) Take by mouth.   rosuvastatin (CRESTOR) 5 MG tablet Take 1 tablet (5 mg total) by mouth daily.     Allergies:   Amoxicillin, Cephalexin, Prednisone, Bactrim ds [sulfamethoxazole-trimethoprim], Codeine, Erythromycin, Fosamax [alendronate], and Sulfamethizole   Social History   Socioeconomic History   Marital status: Widowed    Spouse name: Not on file   Number of children: Not on file   Years of  education: Not on file   Highest education level: Associate degree: academic program  Occupational History   Occupation: Retired  Tobacco Use   Smoking status: Former    Current packs/day: 0.00    Average packs/day: 1 pack/day for 13.0 years (13.0 ttl pk-yrs)    Types: Cigarettes    Start date: 07/28/1967    Quit date: 07/27/1980    Years since quitting: 42.7   Smokeless tobacco: Never  Vaping Use   Vaping status: Never Used  Substance and Sexual Activity   Alcohol use: Not Currently    Comment: Once a month (maybe)   Drug use: No   Sexual activity: Not Currently  Other Topics Concern   Not on file  Social History Narrative   Regular exercise-yes      [place] [facility] to be closer to grandchildrenLikes to garden   Does have a living will-but will need to update   Orvil Feil (daughter) or sons, are health care POA   .Would desire life support, no prolonged life support if futile.   Social Determinants of Health   Financial Resource Strain: Patient Declined (11/30/2022)   Overall Financial Resource Strain (CARDIA)    Difficulty of Paying Living Expenses: Patient declined  Food Insecurity: No Food Insecurity (11/30/2022)   Hunger Vital Sign    Worried About Running Out of Food in the Last Year: Never true    Ran Out of Food in the Last Year: Never true  Transportation Needs: No Transportation Needs (11/30/2022)   PRAPARE - Administrator, Civil Service (Medical): No    Lack of Transportation (Non-Medical): No  Physical Activity: Unknown (11/30/2022)   Exercise Vital Sign    Days of Exercise per Week: 0 days    Minutes of Exercise per Session: Not on file  Stress: Stress Concern Present (11/30/2022)   [employer] of Occupational Health - Occupational Stress Questionnaire    Feeling of Stress : To some extent  Social Connections: Moderately Integrated (11/30/2022)   Social Connection and Isolation Panel [NHANES]    Frequency of Communication with Friends and  Family: Three times a week    Frequency of Social Gatherings with Friends and Family: Once a week    Attends Religious Services: More than 4 times per year    Active Member of [employer] or Organizations: Yes    Attends Banker Meetings: More than 4 times per year    Marital Status: Widowed     Family History: The patient's family history includes Diabetes in her father; Heart attack (age of onset: 15) in her father; Lung cancer in her mother.  ROS:   Please see the history of present illness.     All other systems reviewed and are negative.  EKGs/Labs/Other Studies  Reviewed:    The following studies were reviewed today:  EKG Interpretation Date/Time:  Monday April 12 2023 14:07:15 EDT Ventricular Rate:  79 PR Interval:  150 QRS Duration:  66 QT Interval:  354 QTC Calculation: 405 R Axis:   12  Text Interpretation: Normal sinus rhythm Nonspecific ST and T wave abnormality Confirmed by Debbe Odea ([id number]) on 04/12/2023 2:13:15 PM    Recent Labs: 12/01/2022: ALT 15; Hemoglobin 13.8; Platelets 241.0; TSH 2.36 01/08/2023: BUN 11; Creatinine, Ser 1.00; Potassium 4.1; Sodium 133  Recent Lipid Panel    Component Value Date/Time   CHOL 126 01/08/2023 1144   TRIG 157.0 (H) 01/08/2023 1144   HDL 54.70 01/08/2023 1144   CHOLHDL 2 01/08/2023 1144   VLDL 31.4 01/08/2023 1144   LDLCALC 40 01/08/2023 1144     Risk Assessment/Calculations:     HYPERTENSION CONTROL Vitals:   04/12/23 1401 04/12/23 1409  BP: (!) 142/82 (!) 144/90    The patient's blood pressure is elevated above target today.  In order to address the patient's elevated BP: Blood pressure will be monitored at home to determine if medication changes need to be made.            Physical Exam:    VS:  BP (!) 144/90 (BP Location: Right Arm, Patient Position: Sitting, Cuff Size: Normal)   Pulse 79   Ht 5\' 4"  (1.626 m)   Wt 182 lb 6.4 oz (82.7 kg)   SpO2 98%   BMI 31.31 kg/m     Wt  Readings from Last 3 Encounters:  04/12/23 182 lb 6.4 oz (82.7 kg)  01/08/23 181 lb 8 oz (82.3 kg)  12/01/22 186 lb 2 oz (84.4 kg)     GEN:  Well nourished, well developed in no acute distress HEENT: Normal NECK: No JVD; No carotid bruits CARDIAC: RRR, no murmurs, rubs, gallops RESPIRATORY:  Clear to auscultation without rales, wheezing or rhonchi  ABDOMEN: Soft, non-tender, non-distended MUSCULOSKELETAL:  No edema; No deformity  SKIN: Warm and dry NEUROLOGIC:  Alert and oriented x 3 PSYCHIATRIC:  Normal affect   ASSESSMENT:    1. Precordial pain   2. Palpitations   3. Primary hypertension    PLAN:    In order of problems listed above:  Chest pain, risk factors hypertension, remote smoking.  Get echo, get coronary CT. Palpitations, attributed to anxiety, very rare, last episode over 5 months ago.  Continue to monitor. Hypertension, BP elevated today, usually controlled.  Continue Lopressor 12.5 mg twice daily.  Follow-up after echo and coronary CT.     Medication Adjustments/Labs and Tests Ordered: Current medicines are reviewed at length with the patient today.  Concerns regarding medicines are outlined above.  Orders Placed This Encounter  Procedures   CT CORONARY MORPH W/CTA COR W/SCORE W/CA W/CM &/OR WO/CM   Basic Metabolic Panel (BMET)   EKG 12-Lead   ECHOCARDIOGRAM COMPLETE   Meds ordered this encounter  Medications   metoprolol tartrate (LOPRESSOR) 100 MG tablet    Sig: Take 1 tablet (100 mg total) by mouth once for 1 dose. TWO HOURS PRIOR TO CARDIAC CTA    Dispense:  1 tablet    Refill:  0    Patient Instructions  Medication Instructions:   Your physician recommends that you continue on your current medications as directed. Please refer to the Current Medication list given to you today.  *If you need a refill on your cardiac medications before your next appointment, please call your  pharmacy*   Lab Work:  Your physician recommends you have labs.    If you have labs (blood work) drawn today and your tests are completely normal, you will receive your results only by: MyChart Message (if you have MyChart) OR A paper copy in the mail If you have any lab test that is abnormal or we need to change your treatment, we will call you to review the results.   Testing/Procedures:  Your physician has requested that you have an echocardiogram. Echocardiography is a painless test that uses sound waves to create images of your heart. It provides your doctor with information about the size and shape of your heart and how well your heart's chambers and valves are working. This procedure takes approximately one hour. There are no restrictions for this procedure. Please do NOT wear cologne, perfume, aftershave, or lotions (deodorant is allowed). Please arrive 15 minutes prior to your appointment time.    Your cardiac CT will be scheduled at one of the below locations:   [facility] [address] [place], [place] [address] [phone]  OR   [facility] [address] [place], [place] [address] [phone]  Please follow these instructions carefully (unless otherwise directed):  An IV will be required for this test and Nitroglycerin will be given.  Hold all erectile dysfunction medications at least 3 days (72 hrs) prior to test. (Ie viagra, cialis, sildenafil, tadalafil, etc)   On the Night Before the Test: Be sure to Drink plenty of water. Do not consume any caffeinated/decaffeinated beverages or chocolate 12 hours prior to your test. Do not take any antihistamines 12 hours prior to your test.  On the Day of the Test: Drink plenty of water until 1 hour prior to the test. Do not eat any food 1 hour prior to test. You may take your regular medications prior to the test.  Take metoprolol (Lopressor) two hours prior to test. If you take  Furosemide/Hydrochlorothiazide/Spironolactone, please HOLD on the morning of the test. FEMALES- please wear underwire-free bra if available, avoid dresses & tight clothing      After the Test: Drink plenty of water. After receiving IV contrast, you may experience a mild flushed feeling. This is normal. On occasion, you may experience a mild rash up to 24 hours after the test. This is not dangerous. If this occurs, you can take Benadryl 25 mg and increase your fluid intake. If you experience trouble breathing, this can be serious. If it is severe call 911 IMMEDIATELY. If it is mild, please call our office. If you take any of these medications: Glipizide/Metformin, Avandament, Glucavance, please do not take 48 hours after completing test unless otherwise instructed.  We will call to schedule your test 2-4 weeks out understanding that some insurance companies will need an authorization prior to the service being performed.   For more information and frequently asked questions, please visit our website : [email or web address]  For non-scheduling related questions, please contact the cardiac imaging nurse navigator should you have any questions/concerns: Cardiac Imaging Nurse Navigators Direct Office Dial: [phone]   For scheduling needs, including cancellations and rescheduling, please call [place], [phone].   Follow-Up: At [facility], you and your health needs are our priority.  As part of our continuing mission to provide you with exceptional heart care, we have created designated Provider Care Teams.  These Care Teams include your primary Cardiologist (physician) and Advanced Practice Providers (APPs -  Physician Assistants and Nurse Practitioners) who all work together to provide you with the care you need, when you need it.  We recommend signing up for the patient portal called "MyChart".  Sign up information is provided on this After Visit Summary.  MyChart  is used to connect with patients for Virtual Visits (Telemedicine).  Patients are able to view lab/test results, encounter notes, upcoming appointments, etc.  Non-urgent messages can be sent to your provider as well.   To learn more about what you can do with MyChart, go to [email or web address].    Your next appointment:    After Cardiac Testing  Provider:   You may see Debbe Odea, MD or one of the following Advanced Practice Providers on your designated Care Team:   Nicolasa Ducking, NP Eula Listen, PA-C Cadence Fransico Michael, PA-C Charlsie Quest, NP   Signed, Debbe Odea, MD  04/12/2023 3:15 PM    [facility]

## 2023-04-12 NOTE — Patient Instructions (Signed)
Medication Instructions:   Your physician recommends that you continue on your current medications as directed. Please refer to the Current Medication list given to you today.  *If you need a refill on your cardiac medications before your next appointment, please call your pharmacy*   Lab Work:  Your physician recommends you have labs.   If you have labs (blood work) drawn today and your tests are completely normal, you will receive your results only by: MyChart Message (if you have MyChart) OR A paper copy in the mail If you have any lab test that is abnormal or we need to change your treatment, we will call you to review the results.   Testing/Procedures:  Your physician has requested that you have an echocardiogram. Echocardiography is a painless test that uses sound waves to create images of your heart. It provides your doctor with information about the size and shape of your heart and how well your heart's chambers and valves are working. This procedure takes approximately one hour. There are no restrictions for this procedure. Please do NOT wear cologne, perfume, aftershave, or lotions (deodorant is allowed). Please arrive 15 minutes prior to your appointment time.    Your cardiac CT will be scheduled at one of the below locations:   [facility] [address] [place], [place] [address] [phone]  OR   [facility] [address] [place], [place] [address] [phone]  Please follow these instructions carefully (unless otherwise directed):  An IV will be required for this test and Nitroglycerin will be given.  Hold all erectile dysfunction medications at least 3 days (72 hrs) prior to test. (Ie viagra, cialis, sildenafil, tadalafil, etc)   On the Night Before the Test: Be sure to Drink plenty of water. Do not consume any caffeinated/decaffeinated beverages or chocolate 12 hours prior to your  test. Do not take any antihistamines 12 hours prior to your test.  On the Day of the Test: Drink plenty of water until 1 hour prior to the test. Do not eat any food 1 hour prior to test. You may take your regular medications prior to the test.  Take metoprolol (Lopressor) two hours prior to test. If you take Furosemide/Hydrochlorothiazide/Spironolactone, please HOLD on the morning of the test. FEMALES- please wear underwire-free bra if available, avoid dresses & tight clothing      After the Test: Drink plenty of water. After receiving IV contrast, you may experience a mild flushed feeling. This is normal. On occasion, you may experience a mild rash up to 24 hours after the test. This is not dangerous. If this occurs, you can take Benadryl 25 mg and increase your fluid intake. If you experience trouble breathing, this can be serious. If it is severe call 911 IMMEDIATELY. If it is mild, please call our office. If you take any of these medications: Glipizide/Metformin, Avandament, Glucavance, please do not take 48 hours after completing test unless otherwise instructed.  We will call to schedule your test 2-4 weeks out understanding that some insurance companies will need an authorization prior to the service being performed.   For more information and frequently asked questions, please visit our website : [email or web address]  For non-scheduling related questions, please contact the cardiac imaging nurse navigator should you have any questions/concerns: Cardiac Imaging Nurse Navigators Direct Office Dial: [phone]   For scheduling needs, including cancellations and rescheduling, please call [place], [phone].   Follow-Up: At [facility], you and  your health needs are our priority.  As part of our continuing mission to provide you with exceptional heart care, we have created designated Provider Care Teams.  These Care Teams include your primary Cardiologist  (physician) and Advanced Practice Providers (APPs -  Physician Assistants and Nurse Practitioners) who all work together to provide you with the care you need, when you need it.  We recommend signing up for the patient portal called "MyChart".  Sign up information is provided on this After Visit Summary.  MyChart is used to connect with patients for Virtual Visits (Telemedicine).  Patients are able to view lab/test results, encounter notes, upcoming appointments, etc.  Non-urgent messages can be sent to your provider as well.   To learn more about what you can do with MyChart, go to [email or web address].    Your next appointment:    After Cardiac Testing  Provider:   You may see Debbe Odea, MD or one of the following Advanced Practice Providers on your designated Care Team:   Nicolasa Ducking, NP Eula Listen, PA-C Cadence Fransico Michael, PA-C Charlsie Quest, NP

## 2023-04-13 LAB — BASIC METABOLIC PANEL
BUN/Creatinine Ratio: 10 — ABNORMAL LOW (ref 12–28)
BUN: 10 mg/dL (ref 8–27)
CO2: 20 mmol/L (ref 20–29)
Calcium: 9.7 mg/dL (ref 8.7–10.3)
Chloride: 99 mmol/L (ref 96–106)
Creatinine, Ser: 1.01 mg/dL — ABNORMAL HIGH (ref 0.57–1.00)
Glucose: 114 mg/dL — ABNORMAL HIGH (ref 70–99)
Potassium: 4.7 mmol/L (ref 3.5–5.2)
Sodium: 136 mmol/L (ref 134–144)
eGFR: 58 mL/min/{1.73_m2} — ABNORMAL LOW (ref >59)

## 2023-04-20 ENCOUNTER — Ambulatory Visit (INDEPENDENT_AMBULATORY_CARE_PROVIDER_SITE_OTHER): Payer: Medicare Other

## 2023-04-20 VITALS — Ht 64.0 in | Wt 182.0 lb

## 2023-04-20 DIAGNOSIS — Z0000 Encounter for general adult medical examination without abnormal findings: Secondary | ICD-10-CM

## 2023-04-20 NOTE — Patient Instructions (Addendum)
[name] , Thank you for taking time to come for your Medicare Wellness Visit. I appreciate your ongoing commitment to your health goals. Please review the following plan we discussed and let me know if I can assist you in the future.   Referrals/Orders/Follow-Ups/Clinician Recommendations:   This is a list of the screening recommended for you and due dates:  Health Maintenance  Topic Date Due   Hemoglobin A1C  01/07/2023   COVID-19 Vaccine (6 - 2023-24 season) 03/28/2023   Mammogram  11/07/2023   Yearly kidney health urinalysis for diabetes  01/08/2024   Complete foot exam   01/08/2024   Stool Blood Test  01/08/2024   Eye exam for diabetics  03/07/2024   Yearly kidney function blood test for diabetes  04/11/2024   Medicare Annual Wellness Visit  04/19/2024   DTaP/Tdap/Td vaccine (2 - Td or Tdap) 02/20/2030   Pneumonia Vaccine  Completed   Flu Shot  Completed   DEXA scan (bone density measurement)  Completed   Hepatitis C Screening  Completed   Zoster (Shingles) Vaccine  Completed   HPV Vaccine  Aged Out   Colon Cancer Screening  Discontinued    Advanced directives: (Copy Requested) Please bring a copy of your health care power of attorney and living will to the office to be added to your chart at your convenience.  Next Medicare Annual Wellness Visit scheduled for next year: Yes

## 2023-04-20 NOTE — Progress Notes (Signed)
Subjective:   Brittney Pierce is a 72 y.o. female who presents for Medicare Annual (Subsequent) preventive examination.  Visit Complete: Virtual  I connected with  Brittney Pierce on 04/20/23 by a audio enabled telemedicine application and verified that I am speaking with the correct person using two identifiers.  Patient Location: Home  Provider Location: Home Office  I discussed the limitations of evaluation and management by telemedicine. The patient expressed understanding and agreed to proceed.  Patient Medicare AWV questionnaire was completed by the patient on 04/17/23; I have confirmed that all information answered by patient is correct and no changes since this date.  Cardiac Risk Factors include: advanced age (>51men, >79 women);diabetes mellitus;hypertension     Objective:    Today's Vitals   04/20/23 1421  Weight: 182 lb (82.6 kg)  Height: 5\' 4"  (1.626 m)   Body mass index is 31.24 kg/m.     04/20/2023    2:27 PM 02/09/2022   11:06 AM 05/29/2020   11:13 AM 03/22/2019    9:20 AM 03/17/2019   12:41 PM 05/10/2018   10:42 AM 05/04/2017   10:10 AM  Advanced Directives  Does Patient Have a Medical Advance Directive? Yes No Yes No No Yes Yes  Type of Estate agent of [place];Living will  Healthcare Power of [place];Living will   Healthcare Power of [place];Living will Healthcare Power of [place];Living will  Copy of Healthcare Power of Attorney in Chart? No - copy requested  No - copy requested   No - copy requested No - copy requested  Would patient like information on creating a medical advance directive?  No - Patient declined  No - Patient declined       Current Medications (verified) Outpatient Encounter Medications as of 04/20/2023  Medication Sig   BIOTIN PO Take 1 capsule by mouth daily.    Calcium 1200-1000 MG-UNIT CHEW Chew 1 tablet by mouth daily.   cetirizine (ZYRTEC) 10 MG tablet Take 10 mg by mouth daily.    conjugated estrogens (PREMARIN) vaginal cream Use a pea sized amount on the urethra 2 times per week.   Cranberry-Vitamin C-Vitamin E 4200-20-3 MG-MG-UNIT CAPS Take by mouth. 1 daily   D-MANNOSE PO Take 1,000 mg by mouth daily.   diclofenac sodium (VOLTAREN) 1 % GEL Apply topically as needed.    esomeprazole (NEXIUM) 40 MG capsule TAKE ONE CAPSULE BY MOUTH EVERY DAY - TAKE AT LEAST 1 HOUR BEFORE A MEAL   fluticasone (FLONASE) 50 MCG/ACT nasal spray 2 sprays each nostril as directed.   hydrocortisone 2.5 % cream Apply 1 Application topically 2 (two) times daily.   hydrOXYzine (ATARAX) 10 MG tablet Take 1 tablet (10 mg total) by mouth 3 (three) times daily as needed.   meloxicam (MOBIC) 15 MG tablet Take 1 tablet (15 mg total) by mouth daily. with food   mesalamine (CANASA) 1000 MG suppository PLACE 1 SUPPOSITORY (1,000 MG TOTAL) RECTALLY AT BEDTIME AS NEEDED.   metoprolol tartrate (LOPRESSOR) 100 MG tablet Take 1 tablet (100 mg total) by mouth once for 1 dose. TWO HOURS PRIOR TO CARDIAC CTA   metoprolol tartrate (LOPRESSOR) 25 MG tablet Take 0.5 tablets (12.5 mg total) by mouth 2 (two) times daily.   Multiple Vitamins-Minerals (MULTIVITAMIN PO) Take 1 tablet by mouth daily.   POTASSIUM GLUCONATE PO Take 1 capsule by mouth daily.   pregabalin (LYRICA) 75 MG capsule Take 75 mg by mouth at bedtime.   Probiotic Product (PROBIOTIC DAILY PO) Take  by mouth.   rosuvastatin (CRESTOR) 5 MG tablet Take 1 tablet (5 mg total) by mouth daily.   No facility-administered encounter medications on file as of 04/20/2023.    Allergies (verified) Amoxicillin, Cephalexin, Prednisone, Bactrim ds [sulfamethoxazole-trimethoprim], Codeine, Erythromycin, Fosamax [alendronate], and Sulfamethizole   History: Past Medical History:  Diagnosis Date   DDD (degenerative disc disease), lumbar    Disorder of bone and cartilage, unspecified    Dysuria    Esophageal reflux    Lumbago    Osteoarthrosis, unspecified whether  generalized or localized, unspecified site    Scoliosis    Ulcerative colitis, unspecified    Unspecified essential hypertension    Past Surgical History:  Procedure Laterality Date   CATARACT EXTRACTION, BILATERAL     08/2019 and 09/2019   CESAREAN SECTION     x2   CHOLECYSTECTOMY     LUMBAR DISC SURGERY  2008   Family History  Problem Relation Age of Onset   Heart attack Father 36   Diabetes Father    Lung cancer Mother    Social History   Socioeconomic History   Marital status: Widowed    Spouse name: Not on file   Number of children: Not on file   Years of education: Not on file   Highest education level: Associate degree: academic program  Occupational History   Occupation: Retired  Tobacco Use   Smoking status: Former    Current packs/day: 0.00    Average packs/day: 1 pack/day for 13.0 years (13.0 ttl pk-yrs)    Types: Cigarettes    Start date: 07/28/1967    Quit date: 07/27/1980    Years since quitting: 42.7   Smokeless tobacco: Never  Vaping Use   Vaping status: Never Used  Substance and Sexual Activity   Alcohol use: Not Currently    Comment: Once a month (maybe)   Drug use: No   Sexual activity: Not Currently  Other Topics Concern   Not on file  Social History Narrative   Regular exercise-yes      Moved here from [facility] to be closer to grandchildrenLikes to garden   Does have a living will-but will need to update   Orvil Feil (daughter) or sons, are health care POA   .Would desire life support, no prolonged life support if futile.   Social Determinants of Health   Financial Resource Strain: Low Risk  (04/17/2023)   Overall Financial Resource Strain (CARDIA)    Difficulty of Paying Living Expenses: Not very hard  Food Insecurity: No Food Insecurity (04/17/2023)   Hunger Vital Sign    Worried About Running Out of Food in the Last Year: Never true    Ran Out of Food in the Last Year: Never true  Transportation Needs: No Transportation Needs  (04/17/2023)   PRAPARE - Administrator, Civil Service (Medical): No    Lack of Transportation (Non-Medical): No  Physical Activity: Inactive (04/17/2023)   Exercise Vital Sign    Days of Exercise per Week: 0 days    Minutes of Exercise per Session: 0 min  Stress: No Stress Concern Present (04/17/2023)   [employer] of Occupational Health - Occupational Stress Questionnaire    Feeling of Stress : Only a little  Social Connections: Moderately Integrated (04/17/2023)   Social Connection and Isolation Panel [NHANES]    Frequency of Communication with Friends and Family: More than three times a week    Frequency of Social Gatherings with Friends and Family: Twice a  week    Attends Religious Services: More than 4 times per year    Active Member of Clubs or Organizations: Yes    Attends Banker Meetings: More than 4 times per year    Marital Status: Widowed    Tobacco Counseling Counseling given: Not Answered   Clinical Intake:  Pre-visit preparation completed: Yes  Pain : No/denies pain     BMI - recorded: 31.24 Nutritional Status: BMI > 30  Obese Nutritional Risks: None Diabetes: Yes CBG done?: No Did pt. bring in CBG monitor from home?: No  How often do you need to have someone help you when you read instructions, pamphlets, or other written materials from your doctor or pharmacy?: 1 - Never  Interpreter Needed?: No  Information entered by :: Theresa Mulligan LPN   Activities of Daily Living    04/17/2023    7:33 PM  In your present state of health, do you have any difficulty performing the following activities:  Hearing? 0  Vision? 0  Difficulty concentrating or making decisions? 0  Walking or climbing stairs? 1  Dressing or bathing? 0  Doing errands, shopping? 0  Preparing Food and eating ? N  Using the Toilet? N  In the past six months, have you accidently leaked urine? Y  Comment Followed by PCP  Do you have problems with loss of  bowel control? N  Managing your Medications? N  Managing your Finances? N  Housekeeping or managing your Housekeeping? N    Patient Care Team: Eden Emms, NP as PCP - General (Nurse Practitioner) Debbe Odea, MD as PCP - Cardiology (Cardiology) Brantley Stage, PA-C (Inactive) as Consulting Physician (Gastroenterology) Jesusita Oka, MD as Consulting Physician (Dermatology) Jarvis Newcomer., OD as Consulting Physician (Optometry) Vilinda Flake, [facility] (Inactive) as Pharmacist (Pharmacist)  Indicate any recent Medical Services you may have received from other than [facility] providers in the past year (date may be approximate).     Assessment:   This is a routine wellness examination for [place].  Hearing/Vision screen Hearing Screening - Comments:: Denies hearing difficulties   Vision Screening - Comments:: Wears rx glasses - up to date with routine eye exams with  [facility]   Goals Addressed               This Visit's Progress     Eat more protein (pt-stated)         Depression Screen    04/20/2023    2:36 PM 01/08/2023   10:56 AM 12/01/2022   10:45 AM 02/09/2022   11:04 AM 12/08/2021   11:49 AM 09/10/2021    2:36 PM 05/29/2020   11:14 AM  PHQ 2/9 Scores  PHQ - 2 Score 0 0 3 0 0 0 0  PHQ- 9 Score  2 12 1 3   0    Fall Risk    04/17/2023    7:33 PM 01/08/2023   10:55 AM 12/01/2022   10:45 AM 11/19/2022   12:02 PM 02/09/2022   11:06 AM  Fall Risk   Falls in the past year? 0 0 0 0 0  Number falls in past yr: 0 0 0 0 0  Injury with Fall? 0 0 0 0 0  Risk for fall due to : No Fall Risks No Fall Risks No Fall Risks  No Fall Risks  Follow up Falls prevention discussed Falls evaluation completed Falls evaluation completed Falls evaluation completed Falls evaluation completed  Vital Signs: Unable to obtain new  vitals due to this being a telehealth visit.   MEDICARE RISK AT HOME: Medicare Risk at Home Any stairs in or around the home?: Yes If so, are there  any without handrails?: No Home free of loose throw rugs in walkways, pet beds, electrical cords, etc?: Yes Adequate lighting in your home to reduce risk of falls?: Yes Life alert?: Yes (Apple Watch) Use of a cane, walker or w/c?: No Grab bars in the bathroom?: Yes Shower chair or bench in shower?: Yes Elevated toilet seat or a handicapped toilet?: Yes  TIMED UP AND GO:  Was the test performed?  No    Cognitive Function:    05/29/2020   11:16 AM 05/10/2018   10:43 AM 05/04/2017   10:10 AM  MMSE - Mini Mental State Exam  Orientation to time 5 5 5   Orientation to Place 5 5 5   Registration 3 3 3   Attention/ Calculation 5 0 0  Recall 3 3 3   Language- name 2 objects  0 0  Language- repeat 1 1 1   Language- follow 3 step command  3 3  Language- read & follow direction  0 0  Write a sentence  0 0  Copy design  0 0  Total score  20 20        04/20/2023    2:28 PM 02/09/2022   11:07 AM  6CIT Screen  What Year? 0 points 0 points  What month? 0 points 0 points  What time? 0 points 0 points  Count back from 20 0 points 0 points  Months in reverse 0 points 0 points  Repeat phrase 0 points 0 points  Total Score 0 points 0 points    Immunizations Immunization History  Administered Date(s) Administered   Fluad Quad(high Dose 65+) 06/01/2019, 04/24/2020, 04/13/2023   Influenza Split 05/12/2011   Influenza Whole 04/29/2010   Influenza, High Dose Seasonal PF 04/30/2017, 04/08/2018   Influenza,inj,Quad PF,6+ Mos 04/25/2013, 05/07/2014, 03/20/2015   PFIZER(Purple Top)SARS-COV-2 Vaccination 09/17/2019, 10/10/2019, 05/01/2020, 11/12/2020   PNEUMOCOCCAL CONJUGATE-20 12/08/2021   Pfizer Covid-19 Vaccine Bivalent Booster 53yrs & up 02/23/2021   Pneumococcal Conjugate-13 03/20/2015   Pneumococcal Polysaccharide-23 02/13/2014   Tdap 02/21/2020   Zoster Recombinant(Shingrix) 06/05/2020, 08/22/2020   Zoster, Live 04/07/2011    TDAP status: Up to date  Flu Vaccine status: Up to  date  Pneumococcal vaccine status: Up to date  Covid-19 vaccine status: Declined, Education has been provided regarding the importance of this vaccine but patient still declined. Advised may receive this vaccine at local pharmacy or Health Dept.or vaccine clinic. Aware to provide a copy of the vaccination record if obtained from local pharmacy or Health Dept. Verbalized acceptance and understanding.  Qualifies for Shingles Vaccine? Yes   Zostavax completed Yes   Shingrix Completed?: Yes  Screening Tests Health Maintenance  Topic Date Due   HEMOGLOBIN A1C  01/07/2023   COVID-19 Vaccine (6 - 2023-24 season) 03/28/2023   MAMMOGRAM  11/07/2023   Diabetic kidney evaluation - Urine ACR  01/08/2024   FOOT EXAM  01/08/2024   COLON CANCER SCREENING ANNUAL FOBT  01/08/2024   OPHTHALMOLOGY EXAM  03/07/2024   Diabetic kidney evaluation - eGFR measurement  04/11/2024   Medicare Annual Wellness (AWV)  04/19/2024   DTaP/Tdap/Td (2 - Td or Tdap) 02/20/2030   Pneumonia Vaccine 65+ Years old  Completed   INFLUENZA VACCINE  Completed   DEXA SCAN  Completed   Hepatitis C Screening  Completed   Zoster Vaccines- Shingrix  Completed  HPV VACCINES  Aged Out   Colonoscopy  Discontinued    Health Maintenance  Health Maintenance Due  Topic Date Due   HEMOGLOBIN A1C  01/07/2023   COVID-19 Vaccine (6 - 2023-24 season) 03/28/2023    Colorectal cancer screening: Type of screening: FOBT/FIT. Completed 08/09/21. Repeat every 01/08/24 years  Mammogram status: Completed 11/06/21. Repeat every year  Bone Density status: Ordered scheduled for 08/12/23. Pt provided with contact info and advised to call to schedule appt.     Additional Screening:  Hepatitis C Screening: does qualify; Completed 04/28/16  Vision Screening: Recommended annual ophthalmology exams for early detection of glaucoma and other disorders of the eye. Is the patient up to date with their annual eye exam?  Yes  Who is the provider or  what is the name of the office in which the patient attends annual eye exams? [facility] If pt is not established with a provider, would they like to be referred to a provider to establish care? No .   Dental Screening: Recommended annual dental exams for proper oral hygiene  Diabetic Foot Exam: Diabetic Foot Exam: Completed 01/08/23  Community Resource Referral / Chronic Care Management:  CRR required this visit?  No   CCM required this visit?  No     Plan:     I have personally reviewed and noted the following in the patient's chart:   Medical and social history Use of alcohol, tobacco or illicit drugs  Current medications and supplements including opioid prescriptions. Patient is not currently taking opioid prescriptions. Functional ability and status Nutritional status Physical activity Advanced directives List of other physicians Hospitalizations, surgeries, and ER visits in previous 12 months Vitals Screenings to include cognitive, depression, and falls Referrals and appointments  In addition, I have reviewed and discussed with patient certain preventive protocols, quality metrics, and best practice recommendations. A written personalized care plan for preventive services as well as general preventive health recommendations were provided to patient.     Tillie Rung, LPN   [id number]   After Visit Summary: (MyChart) Due to this being a telephonic visit, the after visit summary with patients personalized plan was offered to patient via MyChart   Nurse Notes: None

## 2023-04-28 ENCOUNTER — Encounter (HOSPITAL_COMMUNITY): Payer: Self-pay

## 2023-04-29 ENCOUNTER — Ambulatory Visit
Admission: RE | Admit: 2023-04-29 | Discharge: 2023-04-29 | Disposition: A | Discharge status: Home / Self Care | Payer: Medicare Other | Source: Ambulatory Visit | Attending: Cardiology | Admitting: Cardiology

## 2023-04-29 DIAGNOSIS — R072 Precordial pain: Secondary | ICD-10-CM | POA: Insufficient documentation

## 2023-04-29 MED ORDER — SODIUM CHLORIDE 0.9 % IV SOLN: INTRAVENOUS | Status: DC

## 2023-04-29 MED ORDER — DILTIAZEM HCL 25 MG/5ML IV SOLN: 10.0000 mg | INTRAVENOUS | Status: DC | PRN

## 2023-04-29 MED ORDER — NITROGLYCERIN 0.4 MG SL SUBL
0.8000 mg | SUBLINGUAL_TABLET | Freq: Once | SUBLINGUAL | Status: AC
  Administered 2023-04-29: 0.8 mg via SUBLINGUAL

## 2023-04-29 MED ORDER — IOHEXOL 350 MG/ML SOLN
100.0000 mL | Freq: Once | INTRAVENOUS | Status: AC | PRN
  Administered 2023-04-29: 100 mL via INTRAVENOUS

## 2023-04-29 MED ORDER — METOPROLOL TARTRATE 5 MG/5ML IV SOLN: 10.0000 mg | Freq: Once | INTRAVENOUS | Status: DC | PRN

## 2023-04-29 NOTE — Progress Notes (Signed)
Patient tolerated procedure well. Ambulate w/o difficulty. Denies light headedness or being dizzy. Drinking water provided. Encouraged to drink extra water today and reasoning explained. Verbalized understanding. All questions answered. ABC intact. No further needs. Discharge from procedure area w/o issues.

## 2023-05-04 ENCOUNTER — Ambulatory Visit: Payer: Medicare Other | Attending: Cardiology

## 2023-05-04 DIAGNOSIS — R072 Precordial pain: Secondary | ICD-10-CM

## 2023-05-04 LAB — ECHOCARDIOGRAM COMPLETE: Area-P 1/2: 2.91 cm2

## 2023-05-13 ENCOUNTER — Encounter: Payer: Self-pay | Admitting: Nurse Practitioner

## 2023-05-26 ENCOUNTER — Encounter: Payer: Self-pay | Admitting: Cardiology

## 2023-05-26 ENCOUNTER — Ambulatory Visit: Payer: Medicare Other | Attending: Cardiology | Admitting: Cardiology

## 2023-05-26 VITALS — BP 144/94 | HR 91 | Ht 64.0 in | Wt 176.8 lb

## 2023-05-26 DIAGNOSIS — R072 Precordial pain: Secondary | ICD-10-CM | POA: Diagnosis not present

## 2023-05-26 DIAGNOSIS — Z760 Encounter for issue of repeat prescription: Secondary | ICD-10-CM | POA: Diagnosis not present

## 2023-05-26 DIAGNOSIS — I10 Essential (primary) hypertension: Secondary | ICD-10-CM | POA: Diagnosis not present

## 2023-05-26 MED ORDER — METOPROLOL TARTRATE 25 MG PO TABS: 25.0000 mg | ORAL_TABLET | Freq: Two times a day (BID) | ORAL | Status: DC

## 2023-05-26 NOTE — Progress Notes (Signed)
Cardiology Office Note:    Date:  05/26/2023   ID:  Brittney Pierce, DOB 17-Apr-1951, MRN [id number]  PCP:  Eden Emms, NP   [facility] Providers Cardiologist:  Debbe Odea, MD     Referring MD: Eden Emms, NP   Chief Complaint  Patient presents with   Follow-up    Discuss cardiac testing results.  Patient denies new or acute cardiac problems/concerns today.      History of Present Illness:    [name] is a 72 y.o. female with a hx of hypertension, remote smoking history x 12 years, degenerative disc disease, chronic back pain presenting for follow-up.  Previously seen with chest pain and palpitations.  Palpitations deemed secondary to stress/anxiety.  Echo and coronary CT obtained to evaluate any significant cardiac etiology for chest pain.  States feeling anxious at times, endorses scoliosis and degenerative disease of her back causing significant discomfort.  Presents for cardiac testing results.   Past Medical History:  Diagnosis Date   DDD (degenerative disc disease), lumbar    Disorder of bone and cartilage, unspecified    Dysuria    Esophageal reflux    Lumbago    Osteoarthrosis, unspecified whether generalized or localized, unspecified site    Scoliosis    Ulcerative colitis, unspecified    Unspecified essential hypertension     Past Surgical History:  Procedure Laterality Date   CATARACT EXTRACTION, BILATERAL     08/2019 and 09/2019   CESAREAN SECTION     x2   CHOLECYSTECTOMY     LUMBAR DISC SURGERY  2008    Current Medications: Current Meds  Medication Sig   BIOTIN PO Take 1 capsule by mouth daily.    Calcium 1200-1000 MG-UNIT CHEW Chew 1 tablet by mouth daily.   cetirizine (ZYRTEC) 10 MG tablet Take 10 mg by mouth daily.   conjugated estrogens (PREMARIN) vaginal cream Use a pea sized amount on the urethra 2 times per week.   Cranberry-Vitamin C-Vitamin E 4200-20-3 MG-MG-UNIT CAPS Take by mouth. 1 daily    D-MANNOSE PO Take 1,000 mg by mouth daily.   diclofenac sodium (VOLTAREN) 1 % GEL Apply topically as needed.    esomeprazole (NEXIUM) 40 MG capsule TAKE ONE CAPSULE BY MOUTH EVERY DAY - TAKE AT LEAST 1 HOUR BEFORE A MEAL   fluticasone (FLONASE) 50 MCG/ACT nasal spray 2 sprays each nostril as directed.   hydrocortisone 2.5 % cream Apply 1 Application topically 2 (two) times daily.   hydrOXYzine (ATARAX) 10 MG tablet Take 1 tablet (10 mg total) by mouth 3 (three) times daily as needed.   meloxicam (MOBIC) 15 MG tablet Take 1 tablet (15 mg total) by mouth daily. with food   mesalamine (CANASA) 1000 MG suppository PLACE 1 SUPPOSITORY (1,000 MG TOTAL) RECTALLY AT BEDTIME AS NEEDED.   Multiple Vitamins-Minerals (MULTIVITAMIN PO) Take 1 tablet by mouth daily.   POTASSIUM GLUCONATE PO Take 1 capsule by mouth daily.   pregabalin (LYRICA) 75 MG capsule Take 75 mg by mouth at bedtime.   Probiotic Product (PROBIOTIC DAILY PO) Take by mouth.   rosuvastatin (CRESTOR) 5 MG tablet Take 1 tablet (5 mg total) by mouth daily.   [DISCONTINUED] metoprolol tartrate (LOPRESSOR) 25 MG tablet Take 0.5 tablets (12.5 mg total) by mouth 2 (two) times daily.     Allergies:   Amoxicillin, Cephalexin, Prednisone, Bactrim ds [sulfamethoxazole-trimethoprim], Codeine, Erythromycin, Fosamax [alendronate], and Sulfamethizole   Social History   Socioeconomic History   Marital status:  Widowed    Spouse name: Not on file   Number of children: Not on file   Years of education: Not on file   Highest education level: Associate degree: academic program  Occupational History   Occupation: Retired  Tobacco Use   Smoking status: Former    Current packs/day: 0.00    Average packs/day: 1 pack/day for 13.0 years (13.0 ttl pk-yrs)    Types: Cigarettes    Start date: 07/28/1967    Quit date: 07/27/1980    Years since quitting: 42.8   Smokeless tobacco: Never  Vaping Use   Vaping status: Never Used  Substance and Sexual Activity    Alcohol use: Not Currently    Comment: Once a month (maybe)   Drug use: No   Sexual activity: Not Currently  Other Topics Concern   Not on file  Social History Narrative   Regular exercise-yes      M[place] [facility] to be closer to grandchildrenLikes to garden   Does have a living will-but will need to update   Brittney Pierce (daughter) or sons, are health care POA   .Would desire life support, no prolonged life support if futile.   Social Determinants of Health   Financial Resource Strain: Low Risk  (04/17/2023)   Overall Financial Resource Strain (CARDIA)    Difficulty of Paying Living Expenses: Not very hard  Food Insecurity: No Food Insecurity (04/17/2023)   Hunger Vital Sign    Worried About Running Out of Food in the Last Year: Never true    Ran Out of Food in the Last Year: Never true  Transportation Needs: No Transportation Needs (04/17/2023)   PRAPARE - Administrator, Civil Service (Medical): No    Lack of Transportation (Non-Medical): No  Physical Activity: Inactive (04/17/2023)   Exercise Vital Sign    Days of Exercise per Week: 0 days    Minutes of Exercise per Session: 0 min  Stress: No Stress Concern Present (04/17/2023)   [employer] of Occupational Health - Occupational Stress Questionnaire    Feeling of Stress : Only a little  Social Connections: Moderately Integrated (04/17/2023)   Social Connection and Isolation Panel [NHANES]    Frequency of Communication with Friends and Family: More than three times a week    Frequency of Social Gatherings with Friends and Family: Twice a week    Attends Religious Services: More than 4 times per year    Active Member of [employer] or Organizations: Yes    Attends Banker Meetings: More than 4 times per year    Marital Status: Widowed     Family History: The patient's family history includes Diabetes in her father; Heart attack (age of onset: 33) in her father; Lung cancer in her  mother.  ROS:   Please see the history of present illness.     All other systems reviewed and are negative.  EKGs/Labs/Other Studies Reviewed:    The following studies were reviewed today:       Recent Labs: 12/01/2022: ALT 15; Hemoglobin 13.8; Platelets 241.0; TSH 2.36 04/12/2023: BUN 10; Creatinine, Ser 1.01; Potassium 4.7; Sodium 136  Recent Lipid Panel    Component Value Date/Time   CHOL 126 01/08/2023 1144   TRIG 157.0 (H) 01/08/2023 1144   HDL 54.70 01/08/2023 1144   CHOLHDL 2 01/08/2023 1144   VLDL 31.4 01/08/2023 1144   LDLCALC 40 01/08/2023 1144     Risk Assessment/Calculations:     HYPERTENSION CONTROL  Vitals:   05/26/23 1115 05/26/23 1121  BP: (!) 146/96 (!) 144/94    The patient's blood pressure is elevated above target today.  In order to address the patient's elevated BP:             Physical Exam:    VS:  BP (!) 144/94 (BP Location: Left Arm, Patient Position: Sitting, Cuff Size: Normal)   Pulse 91   Ht 5\' 4"  (1.626 m)   Wt 176 lb 12.8 oz (80.2 kg)   SpO2 98%   BMI 30.35 kg/m     Wt Readings from Last 3 Encounters:  05/26/23 176 lb 12.8 oz (80.2 kg)  04/20/23 182 lb (82.6 kg)  04/12/23 182 lb 6.4 oz (82.7 kg)     GEN:  Well nourished, well developed in no acute distress HEENT: Normal NECK: No JVD; No carotid bruits CARDIAC: RRR, no murmurs, rubs, gallops RESPIRATORY:  Clear to auscultation without rales, wheezing or rhonchi  ABDOMEN: Soft, non-tender, non-distended MUSCULOSKELETAL:  No edema; No deformity  SKIN: Warm and dry NEUROLOGIC:  Alert and oriented x 3 PSYCHIATRIC:  Normal affect   ASSESSMENT:    1. Precordial pain   2. Primary hypertension   3. Medication refill    PLAN:    In order of problems listed above:  Chest pain, coronary CT with no CAD.  Calcium score 0.  Echo with normal EF 60 to 65%. Hypertension, BP elevated today, usually controlled.  Increase Lopressor to 25 mg twice daily.  Follow-up in 6 months or  as needed after.     Medication Adjustments/Labs and Tests Ordered: Current medicines are reviewed at length with the patient today.  Concerns regarding medicines are outlined above.  No orders of the defined types were placed in this encounter.  Meds ordered this encounter  Medications   metoprolol tartrate (LOPRESSOR) 25 MG tablet    Sig: Take 1 tablet (25 mg total) by mouth 2 (two) times daily.    Patient Instructions  Medication Instructions:   Metoprolol - Take one tablet ( 25mg ) by mouth twice a day. .   *If you need a refill on your cardiac medications before your next appointment, please call your pharmacy*   Lab Work:  None Ordered  If you have labs (blood work) drawn today and your tests are completely normal, you will receive your results only by: MyChart Message (if you have MyChart) OR A paper copy in the mail If you have any lab test that is abnormal or we need to change your treatment, we will call you to review the results.   Testing/Procedures:  None Ordered   Follow-Up: At [facility], you and your health needs are our priority.  As part of our continuing mission to provide you with exceptional heart care, we have created designated Provider Care Teams.  These Care Teams include your primary Cardiologist (physician) and Advanced Practice Providers (APPs -  Physician Assistants and Nurse Practitioners) who all work together to provide you with the care you need, when you need it.  We recommend signing up for the patient portal called "MyChart".  Sign up information is provided on this After Visit Summary.  MyChart is used to connect with patients for Virtual Visits (Telemedicine).  Patients are able to view lab/test results, encounter notes, upcoming appointments, etc.  Non-urgent messages can be sent to your provider as well.   To learn more about what you can do with MyChart, go to [email or web address].  Your next appointment:   6  month(s)  Provider:   You may see Debbe Odea, MD or one of the following Advanced Practice Providers on your designated Care Team:   Nicolasa Ducking, NP Eula Listen, PA-C Cadence Fransico Michael, PA-C Charlsie Quest, NP   Signed, Debbe Odea, MD  05/26/2023 12:42 PM    [facility]

## 2023-05-26 NOTE — Patient Instructions (Signed)
Medication Instructions:   Metoprolol - Take one tablet ( 25mg ) by mouth twice a day. .   *If you need a refill on your cardiac medications before your next appointment, please call your pharmacy*   Lab Work:  None Ordered  If you have labs (blood work) drawn today and your tests are completely normal, you will receive your results only by: MyChart Message (if you have MyChart) OR A paper copy in the mail If you have any lab test that is abnormal or we need to change your treatment, we will call you to review the results.   Testing/Procedures:  None Ordered   Follow-Up: At [facility], you and your health needs are our priority.  As part of our continuing mission to provide you with exceptional heart care, we have created designated Provider Care Teams.  These Care Teams include your primary Cardiologist (physician) and Advanced Practice Providers (APPs -  Physician Assistants and Nurse Practitioners) who all work together to provide you with the care you need, when you need it.  We recommend signing up for the patient portal called "MyChart".  Sign up information is provided on this After Visit Summary.  MyChart is used to connect with patients for Virtual Visits (Telemedicine).  Patients are able to view lab/test results, encounter notes, upcoming appointments, etc.  Non-urgent messages can be sent to your provider as well.   To learn more about what you can do with MyChart, go to [email or web address].    Your next appointment:   6 month(s)  Provider:   You may see Debbe Odea, MD or one of the following Advanced Practice Providers on your designated Care Team:   Nicolasa Ducking, NP Eula Listen, PA-C Cadence Fransico Michael, PA-C Charlsie Quest, NP

## 2023-06-02 ENCOUNTER — Encounter: Payer: Self-pay | Admitting: Nurse Practitioner

## 2023-06-02 DIAGNOSIS — Z760 Encounter for issue of repeat prescription: Secondary | ICD-10-CM

## 2023-06-03 MED ORDER — ESOMEPRAZOLE MAGNESIUM 40 MG PO CPDR: DELAYED_RELEASE_CAPSULE | ORAL | 1 refills | Status: DC

## 2023-06-07 ENCOUNTER — Encounter: Payer: Self-pay | Admitting: Internal Medicine

## 2023-06-08 ENCOUNTER — Encounter: Payer: Self-pay | Admitting: Physician Assistant

## 2023-06-08 ENCOUNTER — Encounter: Payer: Self-pay | Admitting: Cardiology

## 2023-06-08 ENCOUNTER — Ambulatory Visit: Admitting: Physician Assistant

## 2023-06-08 VITALS — BP 138/82 | HR 69

## 2023-06-08 DIAGNOSIS — Z87440 Personal history of urinary (tract) infections: Secondary | ICD-10-CM | POA: Diagnosis not present

## 2023-06-08 DIAGNOSIS — N390 Urinary tract infection, site not specified: Secondary | ICD-10-CM | POA: Diagnosis not present

## 2023-06-08 DIAGNOSIS — R82998 Other abnormal findings in urine: Secondary | ICD-10-CM | POA: Diagnosis not present

## 2023-06-08 LAB — URINALYSIS, COMPLETE
Bilirubin, UA: NEGATIVE
Glucose, UA: NEGATIVE
Ketones, UA: NEGATIVE
Nitrite, UA: POSITIVE — AB
Protein,UA: NEGATIVE
RBC, UA: NEGATIVE
Specific Gravity, UA: 1.01 (ref 1.005–1.030)
Urobilinogen, Ur: 0.2 mg/dL (ref 0.2–1.0)
pH, UA: 7 (ref 5.0–7.5)

## 2023-06-08 LAB — MICROSCOPIC EXAMINATION: WBC, UA: >30 /[HPF] — AB (ref 0–5)

## 2023-06-08 MED ORDER — NITROFURANTOIN MONOHYD MACRO 100 MG PO CAPS
100.0000 mg | ORAL_CAPSULE | Freq: Two times a day (BID) | ORAL | 0 refills | Status: AC
Start: 2023-06-08 — End: 2023-06-13

## 2023-06-08 NOTE — Progress Notes (Unsigned)
06/08/2023 1:14 PM   Brittney Pierce 11/01/1950 [id number]  CC: Chief Complaint  Patient presents with   Follow-up   HPI: Brittney Pierce is a 72 y.o. female with PMH recurrent UTI previously on prophylactic antibiotics who presents today for evaluation of possible UTI.   Today she reports 3 days of dysuria and urgency.  She denies fever, chills, nausea, or vomiting.  This represents her first UTI since stopping suppressive antibiotics about a year ago.  She is on cranberry supplements and estrogen cream, but admits that the estrogen cream can be very irritating with application.  She stopped d-mannose last month because the pills were very large and difficult to swallow.  In-office UA today positive for nitrites and trace leukocytes; urine microscopy with >30 WBCs/HPF and moderate bacteria.  PMH: Past Medical History:  Diagnosis Date   DDD (degenerative disc disease), lumbar    Disorder of bone and cartilage, unspecified    Dysuria    Esophageal reflux    Lumbago    Osteoarthrosis, unspecified whether generalized or localized, unspecified site    Scoliosis    Ulcerative colitis, unspecified    Unspecified essential hypertension     Surgical History: Past Surgical History:  Procedure Laterality Date   CATARACT EXTRACTION, BILATERAL     08/2019 and 09/2019   CESAREAN SECTION     x2   CHOLECYSTECTOMY     LUMBAR DISC SURGERY  2008    Home Medications:  Allergies as of 06/08/2023       Reactions   Amoxicillin Diarrhea   Cephalexin    Prednisone Other (See Comments)   Sx's of heart attack - elevated BP, HR and chest tightness/pressure.   Bactrim Ds [sulfamethoxazole-trimethoprim] Other (See Comments)   Rash,itching,whelps, throat swelling and feels like lump in throat.   Codeine    REACTION: stomach upset   Erythromycin    REACTION: stomach upset   Fosamax [alendronate] Other (See Comments)   Throat felt raw.   Sulfamethizole Hives         Medication List        Accurate as of June 08, 2023  1:14 PM. If you have any questions, ask your nurse or doctor.          BIOTIN PO Take 1 capsule by mouth daily.   Calcium 1200-1000 MG-UNIT Chew Chew 1 tablet by mouth daily.   cetirizine 10 MG tablet Commonly known as: ZYRTEC Take 10 mg by mouth daily.   Cranberry-Vitamin C-Vitamin E 4200-20-3 MG-MG-UNIT Caps Take by mouth. 1 daily   D-MANNOSE PO Take 1,000 mg by mouth daily.   diclofenac sodium 1 % Gel Commonly known as: VOLTAREN Apply topically as needed.   esomeprazole 40 MG capsule Commonly known as: NexIUM TAKE ONE CAPSULE BY MOUTH EVERY DAY - TAKE AT LEAST 1 HOUR BEFORE A MEAL   fluticasone 50 MCG/ACT nasal spray Commonly known as: FLONASE 2 sprays each nostril as directed.   hydrocortisone 2.5 % cream Apply 1 Application topically 2 (two) times daily.   hydrOXYzine 10 MG tablet Commonly known as: ATARAX Take 1 tablet (10 mg total) by mouth 3 (three) times daily as needed.   meloxicam 15 MG tablet Commonly known as: MOBIC Take 1 tablet (15 mg total) by mouth daily. with food   mesalamine 1000 MG suppository Commonly known as: CANASA PLACE 1 SUPPOSITORY (1,000 MG TOTAL) RECTALLY AT BEDTIME AS NEEDED.   metoprolol tartrate 25 MG tablet Commonly known as: LOPRESSOR Take 1 tablet (  25 mg total) by mouth 2 (two) times daily.   MULTIVITAMIN PO Take 1 tablet by mouth daily.   POTASSIUM GLUCONATE PO Take 1 capsule by mouth daily.   pregabalin 75 MG capsule Commonly known as: LYRICA Take 75 mg by mouth at bedtime.   Premarin vaginal cream Generic drug: conjugated estrogens Use a pea sized amount on the urethra 2 times per week.   PROBIOTIC DAILY PO Take by mouth.   rosuvastatin 5 MG tablet Commonly known as: Crestor Take 1 tablet (5 mg total) by mouth daily.        Allergies:  Allergies  Allergen Reactions   Amoxicillin Diarrhea   Cephalexin    Prednisone Other (See  Comments)    Sx's of heart attack - elevated BP, HR and chest tightness/pressure.   Bactrim Ds [Sulfamethoxazole-Trimethoprim] Other (See Comments)    Rash,itching,whelps, throat swelling and feels like lump in throat.   Codeine     REACTION: stomach upset   Erythromycin     REACTION: stomach upset   Fosamax [Alendronate] Other (See Comments)    Throat felt raw.   Sulfamethizole Hives    Family History: Family History  Problem Relation Age of Onset   Heart attack Father 7   Diabetes Father    Lung cancer Mother     Social History:   reports that she quit smoking about 42 years ago. Her smoking use included cigarettes. She started smoking about 55 years ago. She has a 13 pack-year smoking history. She has never used smokeless tobacco. She reports that she does not currently use alcohol. She reports that she does not use drugs.  Physical Exam: BP 138/82   Pulse 69   Constitutional:  Alert and oriented, no acute distress, nontoxic appearing HEENT: [place], AT Cardiovascular: No clubbing, cyanosis, or edema Respiratory: Normal respiratory effort, no increased work of breathing Skin: No rashes, bruises or suspicious lesions Neurologic: Grossly intact, no focal deficits, moving all 4 extremities Psychiatric: Normal mood and affect  Laboratory Data: Results for orders placed or performed in visit on 06/08/23  Microscopic Examination   Urine  Result Value Ref Range   WBC, UA >30 (A) 0 - 5 /hpf   RBC, Urine 0-2 0 - 2 /hpf   Epithelial Cells (non renal) 0-10 0 - 10 /hpf   Bacteria, UA Moderate (A) None seen/Few  Urinalysis, Complete  Result Value Ref Range   Specific Gravity, UA 1.010 1.005 - 1.030   pH, UA 7.0 5.0 - 7.5   Color, UA Yellow Yellow   Appearance Ur Clear Clear   Leukocytes,UA Trace (A) Negative   Protein,UA Negative Negative/Trace   Glucose, UA Negative Negative   Ketones, UA Negative Negative   RBC, UA Negative Negative   Bilirubin, UA Negative Negative    Urobilinogen, Ur 0.2 0.2 - 1.0 mg/dL   Nitrite, UA Positive (A) Negative   Microscopic Examination See below:    Assessment & Plan:   1. Recurrent UTI UA appears grossly infected today, will start empiric Macrobid and send for culture for further evaluation.  Encouraged her to continue cranberry, consider a combination cranberry/d-mannose product to see if it is more palatable, and continue vaginal estrogen cream.  We also discussed nonhormonal vaginal moisturizers to help with vulvovaginal irritation due to GSM. - Urinalysis, Complete - CULTURE, URINE COMPREHENSIVE - nitrofurantoin, macrocrystal-monohydrate, (MACROBID) 100 MG capsule; Take 1 capsule (100 mg total) by mouth 2 (two) times daily for 5 days.  Dispense: 10 capsule; Refill: 0  Return if symptoms worsen or fail to improve.  Carman Ching, PA-C  [facility] [place] [address], [address] [place], [place] [address] [phone]

## 2023-06-08 NOTE — Patient Instructions (Signed)
Recurrent UTI Prevention Strategies  Continue taking an over-the-counter cranberry supplement for urinary tract health. Take this once or twice daily on an empty stomach, e.g. right before bed. Continue taking an over-the-counter d-mannose supplement. Take this daily per packaging instructions. Start taking an over-the-counter probiotic containing the bacterial species called Lactobacillus. Take this daily. Continue vaginal estrogen cream. Apply a pea-sized amount around the opening of the urethra every day for 2 weeks, then three times weekly forever.  Start an over-the-counter nonhormonal vaginal moisturizer, e.g. Replens, to help with dryness/irritation.

## 2023-06-09 ENCOUNTER — Other Ambulatory Visit: Payer: Self-pay

## 2023-06-09 DIAGNOSIS — Z760 Encounter for issue of repeat prescription: Secondary | ICD-10-CM

## 2023-06-09 MED ORDER — METOPROLOL TARTRATE 25 MG PO TABS: 25.0000 mg | ORAL_TABLET | Freq: Two times a day (BID) | ORAL | 3 refills | Status: DC

## 2023-06-11 LAB — CULTURE, URINE COMPREHENSIVE

## 2023-06-14 DIAGNOSIS — M47896 Other spondylosis, lumbar region: Secondary | ICD-10-CM | POA: Diagnosis not present

## 2023-06-16 ENCOUNTER — Telehealth: Payer: Self-pay | Admitting: Physician Assistant

## 2023-06-16 NOTE — Telephone Encounter (Signed)
Patient called and stated that she finished antibiotic on Saturday 11/16, and she is now having urinary symptoms again. She is experiencing burning with urination. Does she need to be seen again, or another medication prescribed? Please advise patient.

## 2023-06-17 DIAGNOSIS — R300 Dysuria: Secondary | ICD-10-CM | POA: Diagnosis not present

## 2023-06-17 DIAGNOSIS — N3000 Acute cystitis without hematuria: Secondary | ICD-10-CM | POA: Diagnosis not present

## 2023-06-17 NOTE — Telephone Encounter (Signed)
Pt was advise to contact her PCP or go to urgent care, due to provider being out and no opening this week. Pt voiced understanding.

## 2023-06-19 ENCOUNTER — Encounter: Payer: Self-pay | Admitting: Cardiology

## 2023-06-21 ENCOUNTER — Telehealth: Payer: Self-pay | Admitting: Cardiology

## 2023-06-21 ENCOUNTER — Other Ambulatory Visit: Payer: Self-pay | Admitting: *Deleted

## 2023-06-21 ENCOUNTER — Telehealth: Payer: Self-pay | Admitting: Nurse Practitioner

## 2023-06-21 DIAGNOSIS — Z760 Encounter for issue of repeat prescription: Secondary | ICD-10-CM

## 2023-06-21 MED ORDER — METOPROLOL TARTRATE 25 MG PO TABS: 25.0000 mg | ORAL_TABLET | Freq: Two times a day (BID) | ORAL | 3 refills | Status: DC

## 2023-06-21 NOTE — Telephone Encounter (Signed)
Called patient states that she worked it out. The medication from [place] didn't come in right. They had the prescription all messed from her cardiology. She was able to reach out to them and get a new script sent in to local pharmacy.  She is feeling much better and declined to set up appointment at this time. If any changes or further questions she will reach out to our office.

## 2023-06-21 NOTE — Telephone Encounter (Signed)
Pt called returning Brittney Pierce's call. Pt states she'd like to speak with a nurse before scheduling any appts. Call back # [phone]

## 2023-06-21 NOTE — Telephone Encounter (Signed)
Left message to return call to our office. When she calls back pls let me know.

## 2023-06-21 NOTE — Telephone Encounter (Signed)
*  STAT* If patient is at the pharmacy, call can be transferred to refill team.   1. Which medications need to be refilled? (please list name of each medication and dose if known) metoprolol tartrate (LOPRESSOR) 25 MG tablet    2. Would you like to learn more about the convenience, safety, & potential cost savings by using the [facility]? No     3. Are you open to using the [facility] (Type [facility]. No                                                                                                                        ).   4. Which pharmacy/location (including street and city if local pharmacy) is medication to be sent to? [id number] - [place], [place] - [address] OF [address]    5. Do they need a 30 day or 90 day supply? 90

## 2023-06-21 NOTE — Telephone Encounter (Signed)
Spoke with patient and advised prescription sent into her pharmacy. She verbalized understanding with no further questions at this time.

## 2023-06-21 NOTE — Telephone Encounter (Signed)
Refill has been sent to pharmacy.  

## 2023-06-21 NOTE — Telephone Encounter (Signed)
Called patient left message to call office to set up visit.

## 2023-06-21 NOTE — Telephone Encounter (Signed)
noted 

## 2023-06-22 ENCOUNTER — Other Ambulatory Visit: Payer: Self-pay | Admitting: Nurse Practitioner

## 2023-06-22 DIAGNOSIS — Z760 Encounter for issue of repeat prescription: Secondary | ICD-10-CM

## 2023-06-23 MED ORDER — MELOXICAM 15 MG PO TABS: 15.0000 mg | ORAL_TABLET | Freq: Every day | ORAL | 0 refills | Status: DC

## 2023-07-05 DIAGNOSIS — R300 Dysuria: Secondary | ICD-10-CM | POA: Diagnosis not present

## 2023-07-19 DIAGNOSIS — R300 Dysuria: Secondary | ICD-10-CM | POA: Diagnosis not present

## 2023-07-19 DIAGNOSIS — N390 Urinary tract infection, site not specified: Secondary | ICD-10-CM | POA: Diagnosis not present

## 2023-08-06 ENCOUNTER — Ambulatory Visit: Payer: Medicare Other | Admitting: Urology

## 2023-08-06 ENCOUNTER — Encounter: Payer: Self-pay | Admitting: Urology

## 2023-08-06 VITALS — BP 130/80 | HR 76 | Ht 66.0 in | Wt 176.0 lb

## 2023-08-06 DIAGNOSIS — R82998 Other abnormal findings in urine: Secondary | ICD-10-CM | POA: Diagnosis not present

## 2023-08-06 DIAGNOSIS — Z87440 Personal history of urinary (tract) infections: Secondary | ICD-10-CM | POA: Diagnosis not present

## 2023-08-06 DIAGNOSIS — R8281 Pyuria: Secondary | ICD-10-CM | POA: Diagnosis not present

## 2023-08-06 DIAGNOSIS — N390 Urinary tract infection, site not specified: Secondary | ICD-10-CM | POA: Diagnosis not present

## 2023-08-06 LAB — URINALYSIS, COMPLETE
Bilirubin, UA: NEGATIVE
Glucose, UA: NEGATIVE
Ketones, UA: NEGATIVE
Nitrite, UA: NEGATIVE
Protein,UA: NEGATIVE
RBC, UA: NEGATIVE
Specific Gravity, UA: 1.025 (ref 1.005–1.030)
Urobilinogen, Ur: 0.2 mg/dL (ref 0.2–1.0)
pH, UA: 6 (ref 5.0–7.5)

## 2023-08-06 LAB — MICROSCOPIC EXAMINATION: Epithelial Cells (non renal): >10 /[HPF] — AB (ref 0–10)

## 2023-08-06 NOTE — Progress Notes (Signed)
 I, Maysun LITTIE Griffiths, acting as a scribe for Glendia JAYSON Barba, MD., have documented all relevant documentation on the behalf of Glendia JAYSON Barba, MD, as directed by Glendia JAYSON Barba, MD while in the presence of Glendia JAYSON Barba, MD.  08/06/2023 4:58 PM   Heron Sheriff Kobel May 18, 1951 [id number]  Referring provider: Wendee Lynwood HERO, NP [address] [place],  [place] [address]  Chief Complaint  Patient presents with   Recurrent UTI   Urologic history 1. Recurrent UTI Low dose vaginal estrogen, D-mannose and cranberry.  HPI: Brittney Pierce is a 73 y.o. female presents for annual follow-up.  She saw Sam Vaillancourt 06/08/2023 with a UTI, which was her first UTI since discontinuing low-dose antibiotic suppression January 2024. She was treated with a 5 day course of Macrobid , which improved her symptoms. However, had recurrent symptoms and was subsequently seen at urgent care and treated with a 7 day course of Keflex  twice daily. She had recurrent symptoms in late December and was treated with Cipro  with a resolution of her symptoms, which have not recurred. She was primarily complaining of frequency urgency and dysuria.   PMH: Past Medical History:  Diagnosis Date   DDD (degenerative disc disease), lumbar    Disorder of bone and cartilage, unspecified    Dysuria    Esophageal reflux    Lumbago    Osteoarthrosis, unspecified whether generalized or localized, unspecified site    Scoliosis    Ulcerative colitis, unspecified    Unspecified essential hypertension     Surgical History: Past Surgical History:  Procedure Laterality Date   CATARACT EXTRACTION, BILATERAL     08/2019 and 09/2019   CESAREAN SECTION     x2   CHOLECYSTECTOMY     LUMBAR DISC SURGERY  2008    Home Medications:  Allergies as of 08/06/2023       Reactions   Amoxicillin  Diarrhea   Cephalexin     Prednisone  Other (See Comments)   Sx's of heart attack - elevated BP, HR and chest  tightness/pressure.   Bactrim  Ds [sulfamethoxazole -trimethoprim ] Other (See Comments)   Rash,itching,whelps, throat swelling and feels like lump in throat.   Codeine    REACTION: stomach upset   Erythromycin    REACTION: stomach upset   Fosamax  [alendronate ] Other (See Comments)   Throat felt raw.   Sulfamethizole Hives        Medication List        Accurate as of August 06, 2023  4:58 PM. If you have any questions, ask your nurse or doctor.          BIOTIN PO Take 1 capsule by mouth daily.   Calcium  1200-1000 MG-UNIT Chew Chew 1 tablet by mouth daily.   cetirizine 10 MG tablet Commonly known as: ZYRTEC Take 10 mg by mouth daily.   Cranberry-Vitamin C-Vitamin E 4200-20-3 MG-MG-UNIT Caps Take by mouth. 1 daily   D-MANNOSE PO Take 1,000 mg by mouth daily.   diclofenac sodium 1 % Gel Commonly known as: VOLTAREN Apply topically as needed.   esomeprazole  40 MG capsule Commonly known as: NexIUM  TAKE ONE CAPSULE BY MOUTH EVERY DAY - TAKE AT LEAST 1 HOUR BEFORE A MEAL   fluticasone  50 MCG/ACT nasal spray Commonly known as: FLONASE  2 sprays each nostril as directed.   hydrocortisone  2.5 % cream Apply 1 Application topically 2 (two) times daily.   hydrOXYzine  10 MG tablet Commonly known as: ATARAX  Take 1 tablet (10 mg total) by mouth  3 (three) times daily as needed.   meloxicam  15 MG tablet Commonly known as: MOBIC  Take 1 tablet (15 mg total) by mouth daily. with food   mesalamine  1000 MG suppository Commonly known as: CANASA  PLACE 1 SUPPOSITORY (1,000 MG TOTAL) RECTALLY AT BEDTIME AS NEEDED.   metoprolol  tartrate 25 MG tablet Commonly known as: LOPRESSOR  Take 1 tablet (25 mg total) by mouth 2 (two) times daily.   MULTIVITAMIN PO Take 1 tablet by mouth daily.   POTASSIUM GLUCONATE PO Take 1 capsule by mouth daily.   pregabalin  75 MG capsule Commonly known as: LYRICA  Take 75 mg by mouth at bedtime.   Premarin  vaginal cream Generic drug:  conjugated estrogens  Use a pea sized amount on the urethra 2 times per week.   PROBIOTIC DAILY PO Take by mouth.   rosuvastatin  5 MG tablet Commonly known as: Crestor  Take 1 tablet (5 mg total) by mouth daily.        Allergies:  Allergies  Allergen Reactions   Amoxicillin  Diarrhea   Cephalexin     Prednisone  Other (See Comments)    Sx's of heart attack - elevated BP, HR and chest tightness/pressure.   Bactrim  Ds [Sulfamethoxazole -Trimethoprim ] Other (See Comments)    Rash,itching,whelps, throat swelling and feels like lump in throat.   Codeine     REACTION: stomach upset   Erythromycin     REACTION: stomach upset   Fosamax  [Alendronate ] Other (See Comments)    Throat felt raw.   Sulfamethizole Hives    Family History: Family History  Problem Relation Age of Onset   Heart attack Father 62   Diabetes Father    Lung cancer Mother     Social History:  reports that she quit smoking about 43 years ago. Her smoking use included cigarettes. She started smoking about 56 years ago. She has a 13 pack-year smoking history. She has never used smokeless tobacco. She reports that she does not currently use alcohol. She reports that she does not use drugs.   Physical Exam: BP 130/80   Pulse 76   Ht 5' 6 (1.676 m)   Wt 176 lb (79.8 kg)   BMI 28.41 kg/m   Constitutional:  Alert and oriented, No acute distress. HEENT: [place] AT Respiratory: Normal respiratory effort, no increased work of breathing. Psychiatric: Normal mood and affect.   Urinalysis Dipstick 1+ leukocytes, microscopy 11-30 WBC/> 10 epis.    Assessment & Plan:    1. Recurrent UTI UA today with pyuria, however significant epithelial cells indicating vaginal contamination.  Since she is asymptomatic, no treatment needed.  If she does have a recurrent infection within the next few months, would start back on low-dose antibiotic prophylaxis.  Continue annual follow-up.   I have reviewed the above documentation  for accuracy and completeness, and I agree with the above.   Glendia JAYSON Barba, MD  [facility] [address] [place], [place] [address] [phone]

## 2023-08-12 ENCOUNTER — Ambulatory Visit
Admission: RE | Admit: 2023-08-12 | Discharge: 2023-08-12 | Disposition: A | Discharge status: Home / Self Care | Payer: Medicare Other | Source: Ambulatory Visit | Attending: Nurse Practitioner | Admitting: Nurse Practitioner

## 2023-08-12 DIAGNOSIS — M810 Age-related osteoporosis without current pathological fracture: Secondary | ICD-10-CM

## 2023-08-12 DIAGNOSIS — N958 Other specified menopausal and perimenopausal disorders: Secondary | ICD-10-CM | POA: Diagnosis not present

## 2023-08-12 DIAGNOSIS — M8588 Other specified disorders of bone density and structure, other site: Secondary | ICD-10-CM | POA: Diagnosis not present

## 2023-08-12 DIAGNOSIS — E2839 Other primary ovarian failure: Secondary | ICD-10-CM | POA: Diagnosis not present

## 2023-08-13 ENCOUNTER — Encounter: Payer: Self-pay | Admitting: Nurse Practitioner

## 2023-08-18 ENCOUNTER — Ambulatory Visit: Payer: Medicare Other | Admitting: Nurse Practitioner

## 2023-08-18 VITALS — BP 118/86 | HR 64 | Temp 98.0°F | Ht 66.0 in | Wt 176.0 lb

## 2023-08-18 DIAGNOSIS — R944 Abnormal results of kidney function studies: Secondary | ICD-10-CM

## 2023-08-18 DIAGNOSIS — E1165 Type 2 diabetes mellitus with hyperglycemia: Secondary | ICD-10-CM | POA: Diagnosis not present

## 2023-08-18 DIAGNOSIS — K5180 Other ulcerative colitis without complications: Secondary | ICD-10-CM | POA: Diagnosis not present

## 2023-08-18 DIAGNOSIS — I10 Essential (primary) hypertension: Secondary | ICD-10-CM | POA: Diagnosis not present

## 2023-08-18 DIAGNOSIS — M810 Age-related osteoporosis without current pathological fracture: Secondary | ICD-10-CM | POA: Diagnosis not present

## 2023-08-18 DIAGNOSIS — G8929 Other chronic pain: Secondary | ICD-10-CM

## 2023-08-18 DIAGNOSIS — M545 Low back pain, unspecified: Secondary | ICD-10-CM | POA: Diagnosis not present

## 2023-08-18 DIAGNOSIS — Z760 Encounter for issue of repeat prescription: Secondary | ICD-10-CM

## 2023-08-18 LAB — BASIC METABOLIC PANEL
BUN: 19 mg/dL (ref 6–23)
CO2: 29 meq/L (ref 19–32)
Calcium: 10.1 mg/dL (ref 8.4–10.5)
Chloride: 99 meq/L (ref 96–112)
Creatinine, Ser: 1.08 mg/dL (ref 0.40–1.20)
GFR: 50.57 mL/min — ABNORMAL LOW (ref >60.00)
Glucose, Bld: 100 mg/dL — ABNORMAL HIGH (ref 70–99)
Potassium: 5.1 meq/L (ref 3.5–5.1)
Sodium: 135 meq/L (ref 135–145)

## 2023-08-18 LAB — VITAMIN D 25 HYDROXY (VIT D DEFICIENCY, FRACTURES): VITD: 70.39 ng/mL (ref 30.00–100.00)

## 2023-08-18 LAB — POCT GLYCOSYLATED HEMOGLOBIN (HGB A1C): Hemoglobin A1C: 6 % — AB (ref 4.0–5.6)

## 2023-08-18 MED ORDER — MELOXICAM 15 MG PO TABS
15.0000 mg | ORAL_TABLET | Freq: Every day | ORAL | 1 refills | Status: DC
Start: 2023-08-18 — End: 2024-02-16

## 2023-08-18 MED ORDER — LIDOCAINE 5 % EX PTCH: 2.0000 | MEDICATED_PATCH | CUTANEOUS | 3 refills | Status: DC

## 2023-08-18 MED ORDER — TRAMADOL HCL 50 MG PO TABS
50.0000 mg | ORAL_TABLET | Freq: Two times a day (BID) | ORAL | 0 refills | Status: DC | PRN
Start: 2023-08-18 — End: 2024-02-16

## 2023-08-18 MED ORDER — ESOMEPRAZOLE MAGNESIUM 40 MG PO CPDR
DELAYED_RELEASE_CAPSULE | ORAL | 1 refills | Status: DC
Start: 2023-08-18 — End: 2024-02-16

## 2023-08-18 NOTE — Assessment & Plan Note (Signed)
Well-controlled on lifestyle modifications only.  A1c is 6.0% today.  Continue lifestyle modifications no need to check glucose at this juncture unless patient wants to

## 2023-08-18 NOTE — Progress Notes (Signed)
Established Patient Office Visit  Subjective   Patient ID: Brittney Pierce, female    DOB: Mar 22, 1951  Age: 73 y.o. MRN: [id number]  Chief Complaint  Patient presents with   Follow-up    Nexium, meloxicam, metoprolol (increased dosage by cardio), Crestor,     HPI  DM2: patient is currenlty maintained on lifestyle modicifacation only her last A1C was 6.3.  Does not currently check her sugars at home  UC: states that she uses a probiotic and has some older mesalamine if she needs it. Has not had to use it for an extended period of time   HTN: patient is currenlty on metoprolol 25mg  BID. She is followed by cardioloyg. State that he did double her medication. States that she has had some chest pain in July last year. States that since the increeasd she has not been having the chest pain. States that she is doing more of her community service   Chronic back pain: Patient was seen by Ortho clinic she is nonoperable.  They do use lidocaine 5% patches along with tramadol 50 mg twice daily as needed.  States the provider is leaving the clinic, suggested she talk to her primary care to see if I will take over her medications.    Review of Systems  Constitutional:  Negative for chills and fever.  Respiratory:  Negative for shortness of breath.   Cardiovascular:  Negative for chest pain.  Gastrointestinal:  Negative for abdominal pain and constipation.  Musculoskeletal:  Positive for back pain. Negative for joint pain.  Neurological:  Negative for headaches.      Objective:     BP 118/86   Pulse 64   Temp 98 F (36.7 C) (Oral)   Ht 5\' 6"  (1.676 m)   Wt 176 lb (79.8 kg)   SpO2 98%   BMI 28.41 kg/m  BP Readings from Last 3 Encounters:  08/18/23 118/86  08/06/23 130/80  06/08/23 138/82   Wt Readings from Last 3 Encounters:  08/18/23 176 lb (79.8 kg)  08/06/23 176 lb (79.8 kg)  05/26/23 176 lb 12.8 oz (80.2 kg)   SpO2 Readings from Last 3 Encounters:  08/18/23 98%   05/26/23 98%  04/29/23 95%      Physical Exam Vitals and nursing note reviewed.  Constitutional:      Appearance: Normal appearance.  Cardiovascular:     Rate and Rhythm: Normal rate and regular rhythm.     Heart sounds: Normal heart sounds.  Pulmonary:     Effort: Pulmonary effort is normal.     Breath sounds: Normal breath sounds.  Abdominal:     General: Bowel sounds are normal.  Neurological:     Mental Status: She is alert.      Results for orders placed or performed in visit on 08/18/23  POCT glycosylated hemoglobin (Hb A1C)  Result Value Ref Range   Hemoglobin A1C 6.0 (A) 4.0 - 5.6 %   HbA1c POC (<> result, manual entry)     HbA1c, POC (prediabetic range)     HbA1c, POC (controlled diabetic range)        The ASCVD Risk score (Arnett DK, et al., 2019) failed to calculate for the following reasons:   The valid total cholesterol range is 130 to 320 mg/dL    Assessment & Plan:   Problem List Items Addressed This Visit       Cardiovascular and Mediastinum   Essential hypertension - Primary   Patient currently maintained on metoprolol  25 mg twice daily.  Blood pressure well-controlled.  She is followed by cardiology.  Continue taking medication prescribed follow-up with specialist as recommended      Relevant Orders   Basic metabolic panel     Digestive   Ulcerative colitis (HCC)   Currently in remission.  Patient uses mesalamine suppositories as needed        Endocrine   Type 2 diabetes mellitus with hyperglycemia, without long-term current use of insulin (HCC)   Well-controlled on lifestyle modifications only.  A1c is 6.0% today.  Continue lifestyle modifications no need to check glucose at this juncture unless patient wants to      Relevant Orders   Basic metabolic panel   POCT glycosylated hemoglobin (Hb A1C) (Completed)     Musculoskeletal and Integument   Age-related osteoporosis without current pathological fracture   Pending vitamin D  level DEXA scan up-to-date      Relevant Orders   VITAMIN D 25 Hydroxy (Vit-D Deficiency, Fractures)     Other   BACK PAIN, LUMBAR, CHRONIC   History of same was being followed by Ortho clinic.  Nonoperable.  I did tell her I would take over her Lidoderm patches and tramadol As needed use.  Refill sent in today      Relevant Medications   lidocaine (LIDODERM) 5 %   traMADol (ULTRAM) 50 MG tablet   meloxicam (MOBIC) 15 MG tablet   Medication refill   Relevant Medications   esomeprazole (NEXIUM) 40 MG capsule   meloxicam (MOBIC) 15 MG tablet   Other Visit Diagnoses       Decreased GFR       Relevant Orders   Basic metabolic panel       Return in about 3 months (around 11/16/2023) for chornic pain/med recheck .    Audria Nine, NP

## 2023-08-18 NOTE — Patient Instructions (Signed)
Nice to see you today. I will be in touch with the labs once I have reviewed them Follow up with me in 3 months

## 2023-08-18 NOTE — Assessment & Plan Note (Signed)
Pending vitamin D level DEXA scan up-to-date

## 2023-08-18 NOTE — Assessment & Plan Note (Signed)
Patient currently maintained on metoprolol 25 mg twice daily.  Blood pressure well-controlled.  She is followed by cardiology.  Continue taking medication prescribed follow-up with specialist as recommended

## 2023-08-18 NOTE — Assessment & Plan Note (Signed)
History of same was being followed by Ortho clinic.  Nonoperable.  I did tell her I would take over her Lidoderm patches and tramadol As needed use.  Refill sent in today

## 2023-08-18 NOTE — Assessment & Plan Note (Signed)
Currently in remission.  Patient uses mesalamine suppositories as needed

## 2023-08-19 ENCOUNTER — Encounter: Payer: Self-pay | Admitting: Nurse Practitioner

## 2023-08-23 ENCOUNTER — Encounter: Payer: Self-pay | Admitting: Nurse Practitioner

## 2023-08-25 ENCOUNTER — Ambulatory Visit: Payer: Medicare Other | Admitting: Urology

## 2023-08-25 ENCOUNTER — Encounter: Payer: Self-pay | Admitting: Urology

## 2023-08-25 VITALS — BP 144/84 | HR 71 | Ht 63.5 in | Wt 176.6 lb

## 2023-08-25 DIAGNOSIS — R300 Dysuria: Secondary | ICD-10-CM | POA: Diagnosis not present

## 2023-08-25 DIAGNOSIS — Z87440 Personal history of urinary (tract) infections: Secondary | ICD-10-CM | POA: Diagnosis not present

## 2023-08-25 DIAGNOSIS — N952 Postmenopausal atrophic vaginitis: Secondary | ICD-10-CM | POA: Diagnosis not present

## 2023-08-25 DIAGNOSIS — R82998 Other abnormal findings in urine: Secondary | ICD-10-CM

## 2023-08-25 LAB — MICROSCOPIC EXAMINATION: WBC, UA: >30 /[HPF] — AB (ref 0–5)

## 2023-08-25 LAB — URINALYSIS, COMPLETE
Bilirubin, UA: NEGATIVE
Glucose, UA: NEGATIVE
Ketones, UA: NEGATIVE
Nitrite, UA: NEGATIVE
RBC, UA: NEGATIVE
Specific Gravity, UA: >1.03 — ABNORMAL HIGH (ref 1.005–1.030)
Urobilinogen, Ur: 0.2 mg/dL (ref 0.2–1.0)
pH, UA: 5.5 (ref 5.0–7.5)

## 2023-08-25 MED ORDER — CIPROFLOXACIN HCL 250 MG PO TABS
250.0000 mg | ORAL_TABLET | Freq: Two times a day (BID) | ORAL | 0 refills | Status: DC
Start: 2023-08-25 — End: 2023-09-03

## 2023-08-25 NOTE — Progress Notes (Signed)
08/25/2023 12:03 PM   Brittney Pierce [birth year] [id number]  Referring provider: Eden Emms, NP [address] [place],  [place] [address]  Urological history: 1. rUTI's -Contributing factors of age, GSM, ulcerative colitis and diabetes -Documented urine cultures over the last year  July 19, 2023, -E. coli and mixed urogenital flora  July 05, 2023 - E.coli  June 17, 2023-E. coli  June 08, 2023, -E. Coli -Vaginal estrogen cream, cranberry tablets and d-mannose    Chief Complaint  Patient presents with   Dysuria   HPI: Brittney Pierce is a 73 y.o. female who presents today for stinging, burning and painful urination.  Previous records reviewed.   She had the onset of frequency, urgency and dysuria on Monday.  Patient denies any modifying or aggravating factors.  Patient denies any recent UTI's, gross hematuria, dysuria or suprapubic/flank pain.  Patient denies any fevers, chills, nausea or vomiting.    CATH UA yellow slightly cloudy, specific gravity greater than 1.030, pH 5.5, 1+ protein, 1+ leukocyte, greater than 30 WBCs, hyaline cast present and many bacteria  PMH: Past Medical History:  Diagnosis Date   DDD (degenerative disc disease), lumbar    Disorder of bone and cartilage, unspecified    Dysuria    Esophageal reflux    Lumbago    Osteoarthrosis, unspecified whether generalized or localized, unspecified site    Scoliosis    Ulcerative colitis, unspecified    Unspecified essential hypertension     Surgical History: Past Surgical History:  Procedure Laterality Date   CATARACT EXTRACTION, BILATERAL     08/2019 and 09/2019   CESAREAN SECTION     x2   CHOLECYSTECTOMY     LUMBAR DISC SURGERY  2008    Home Medications:  Allergies as of 08/25/2023       Reactions   Amoxicillin Diarrhea   Prednisone Other (See Comments)   Sx's of heart attack - elevated BP, HR and chest tightness/pressure.   Bactrim Ds  [sulfamethoxazole-trimethoprim] Other (See Comments)   Rash,itching,whelps, throat swelling and feels like lump in throat.   Codeine    REACTION: stomach upset   Erythromycin    REACTION: stomach upset   Fosamax [alendronate] Other (See Comments)   Throat felt raw.   Sulfamethizole Hives        Medication List        Accurate as of August 25, 2023 12:03 PM. If you have any questions, ask your nurse or doctor.          STOP taking these medications    cephALEXin 500 MG capsule Commonly known as: KEFLEX   nitrofurantoin (macrocrystal-monohydrate) 100 MG capsule Commonly known as: MACROBID       TAKE these medications    BIOTIN PO Take 1 capsule by mouth daily.   Calcium 1200-1000 MG-UNIT Chew Chew 1 tablet by mouth daily.   cetirizine 10 MG tablet Commonly known as: ZYRTEC Take 10 mg by mouth daily.   ciprofloxacin 250 MG tablet Commonly known as: Cipro Take 1 tablet (250 mg total) by mouth 2 (two) times daily. What changed:  medication strength how much to take when to take this   Cranberry-Vitamin C-Vitamin E 4200-20-3 MG-MG-UNIT Caps Take by mouth. 1 daily   D-MANNOSE PO Take 1,000 mg by mouth daily.   diclofenac sodium 1 % Gel Commonly known as: VOLTAREN Apply topically as needed.   esomeprazole 40 MG capsule Commonly known as: NexIUM TAKE ONE CAPSULE BY MOUTH EVERY  DAY - TAKE AT LEAST 1 HOUR BEFORE A MEAL   fluticasone 50 MCG/ACT nasal spray Commonly known as: FLONASE 2 sprays each nostril as directed.   hydrocortisone 2.5 % cream Apply 1 Application topically 2 (two) times daily.   hydrOXYzine 10 MG tablet Commonly known as: ATARAX Take 1 tablet (10 mg total) by mouth 3 (three) times daily as needed.   lidocaine 5 % Commonly known as: Lidoderm Place 2 patches onto the skin daily. Remove & Discard patch within 12 hours or as directed by MD   meloxicam 15 MG tablet Commonly known as: MOBIC Take 1 tablet (15 mg total) by mouth  daily. with food   mesalamine 1000 MG suppository Commonly known as: CANASA PLACE 1 SUPPOSITORY (1,000 MG TOTAL) RECTALLY AT BEDTIME AS NEEDED.   metoprolol tartrate 25 MG tablet Commonly known as: LOPRESSOR Take 1 tablet (25 mg total) by mouth 2 (two) times daily.   MULTIVITAMIN PO Take 1 tablet by mouth daily.   POTASSIUM GLUCONATE PO Take 1 capsule by mouth daily.   pregabalin 75 MG capsule Commonly known as: LYRICA Take 75 mg by mouth at bedtime.   Premarin vaginal cream Generic drug: conjugated estrogens Use a pea sized amount on the urethra 2 times per week.   PROBIOTIC DAILY PO Take by mouth.   rosuvastatin 5 MG tablet Commonly known as: Crestor Take 1 tablet (5 mg total) by mouth daily.   traMADol 50 MG tablet Commonly known as: ULTRAM Take 1 tablet (50 mg total) by mouth 2 (two) times daily as needed.        Allergies:  Allergies  Allergen Reactions   Amoxicillin Diarrhea   Prednisone Other (See Comments)    Sx's of heart attack - elevated BP, HR and chest tightness/pressure.   Bactrim Ds [Sulfamethoxazole-Trimethoprim] Other (See Comments)    Rash,itching,whelps, throat swelling and feels like lump in throat.   Codeine     REACTION: stomach upset   Erythromycin     REACTION: stomach upset   Fosamax [Alendronate] Other (See Comments)    Throat felt raw.   Sulfamethizole Hives    Family History: Family History  Problem Relation Age of Onset   Heart attack Father 53   Diabetes Father    Lung cancer Mother     Social History:  reports that she quit smoking about 43 years ago. Her smoking use included cigarettes. She started smoking about 56 years ago. She has a 13 pack-year smoking history. She has never used smokeless tobacco. She reports that she does not currently use alcohol. She reports that she does not use drugs.  ROS: Pertinent ROS in HPI  Physical Exam: BP (!) 144/84   Pulse 71   Ht 5' 3.5" (1.613 m)   Wt 176 lb 9.6 oz (80.1 kg)    BMI 30.79 kg/m   Constitutional:  Well nourished. Alert and oriented, No acute distress. HEENT: [place] AT, moist mucus membranes.  Trachea midline, no masses. Cardiovascular: No clubbing, cyanosis, or edema. Respiratory: Normal respiratory effort, no increased work of breathing. Neurologic: Grossly intact, no focal deficits, moving all 4 extremities. Psychiatric: Normal mood and affect.    Laboratory Data: Lab Results  Component Value Date   WBC 8.9 12/01/2022   HGB 13.8 12/01/2022   HCT 40.5 12/01/2022   MCV 88.3 12/01/2022   PLT 241.0 12/01/2022    Lab Results  Component Value Date   CREATININE 1.08 08/18/2023    Lab Results  Component Value Date  HGBA1C 6.0 (A) 08/18/2023    Lab Results  Component Value Date   TSH 2.36 12/01/2022       Component Value Date/Time   CHOL 126 01/08/2023 1144   HDL 54.70 01/08/2023 1144   CHOLHDL 2 01/08/2023 1144   VLDL 31.4 01/08/2023 1144   LDLCALC 40 01/08/2023 1144    Lab Results  Component Value Date   AST 22 12/01/2022   Lab Results  Component Value Date   ALT 15 12/01/2022   Urinalysis See EPIC and HPI  I have reviewed the labs.   Pertinent Imaging: N/A  Assessment & Plan:    1. rUTI's - criteria for recurrent UTI has been met with 2 or more infections in 6 months or 3 or greater infections in one year  - patient is instructed to increase their water intake until the urine is pale yellow or clear (10 to 12 cups daily)  - patient is instructed to take probiotics (yogurt, oral pills or vaginal suppositories), take cranberry pills or drink the juice and Vitamin C 1,000 mg daily to acidify the urine  -UA grossly infected -urine culture pending -started Cipro empirically -Will adjust if necessary once culture results are available and once this infection is treated, we have placed her back on 3 months of prophylactic antibiotics  2.  Vaginal atrophy -Continue vaginal estrogen cream 3 nights weekly                                               Return for pending urine culture results.  These notes generated with voice recognition software. I apologize for typographical errors.  Cloretta Ned  [facility] [address]  [address] [place], [place] [address] [phone]

## 2023-08-26 MED ORDER — PREGABALIN 75 MG PO CAPS: 75.0000 mg | ORAL_CAPSULE | Freq: Every evening | ORAL | 2 refills | Status: DC

## 2023-08-28 LAB — CULTURE, URINE COMPREHENSIVE

## 2023-08-30 ENCOUNTER — Other Ambulatory Visit: Payer: Self-pay | Admitting: Urology

## 2023-08-30 DIAGNOSIS — N390 Urinary tract infection, site not specified: Secondary | ICD-10-CM

## 2023-08-30 MED ORDER — CEFUROXIME AXETIL 500 MG PO TABS: 500.0000 mg | ORAL_TABLET | Freq: Two times a day (BID) | ORAL | 0 refills | Status: DC

## 2023-09-03 ENCOUNTER — Other Ambulatory Visit: Payer: Self-pay | Admitting: Urology

## 2023-09-03 MED ORDER — NITROFURANTOIN MONOHYD MACRO 100 MG PO CAPS
100.0000 mg | ORAL_CAPSULE | Freq: Two times a day (BID) | ORAL | 0 refills | Status: DC
Start: 2023-09-03 — End: 2023-09-15

## 2023-09-15 ENCOUNTER — Other Ambulatory Visit: Payer: Self-pay | Admitting: Urology

## 2023-09-15 DIAGNOSIS — N390 Urinary tract infection, site not specified: Secondary | ICD-10-CM

## 2023-09-15 MED ORDER — NITROFURANTOIN MONOHYD MACRO 100 MG PO CAPS
100.0000 mg | ORAL_CAPSULE | Freq: Every day | ORAL | 0 refills | Status: DC
Start: 2023-09-15 — End: 2024-01-04

## 2023-09-18 ENCOUNTER — Encounter: Payer: Self-pay | Admitting: Nurse Practitioner

## 2023-09-18 DIAGNOSIS — E1165 Type 2 diabetes mellitus with hyperglycemia: Secondary | ICD-10-CM

## 2023-09-20 MED ORDER — ROSUVASTATIN CALCIUM 5 MG PO TABS: 5.0000 mg | ORAL_TABLET | Freq: Every day | ORAL | 2 refills | Status: DC

## 2023-10-22 ENCOUNTER — Ambulatory Visit: Payer: Medicare Other | Attending: Cardiology | Admitting: Cardiology

## 2023-10-22 ENCOUNTER — Encounter: Payer: Self-pay | Admitting: Cardiology

## 2023-10-22 VITALS — BP 132/88 | HR 71 | Ht 63.5 in | Wt 178.6 lb

## 2023-10-22 DIAGNOSIS — I10 Essential (primary) hypertension: Secondary | ICD-10-CM | POA: Diagnosis not present

## 2023-10-22 DIAGNOSIS — Z760 Encounter for issue of repeat prescription: Secondary | ICD-10-CM

## 2023-10-22 MED ORDER — METOPROLOL TARTRATE 25 MG PO TABS: 25.0000 mg | ORAL_TABLET | Freq: Two times a day (BID) | ORAL | 3 refills | Status: AC

## 2023-10-22 NOTE — Patient Instructions (Signed)
 Medication Instructions:  No changes at this time.   *If you need a refill on your cardiac medications before your next appointment, please call your pharmacy*  Lab Work: None  If you have labs (blood work) drawn today and your tests are completely normal, you will receive your results only by: MyChart Message (if you have MyChart) OR A paper copy in the mail If you have any lab test that is abnormal or we need to change your treatment, we will call you to review the results.  Testing/Procedures: None  Follow-Up: At [facility], you and your health needs are our priority.  As part of our continuing mission to provide you with exceptional heart care, our providers are all part of one team.  This team includes your primary Cardiologist (physician) and Advanced Practice Providers or APPs (Physician Assistants and Nurse Practitioners) who all work together to provide you with the care you need, when you need it.  Your next appointment:   Follow up as needed.

## 2023-10-22 NOTE — Progress Notes (Signed)
 Cardiology Office Note:    Date:  10/22/2023   ID:  Brittney Pierce, [place] 11/05/1950, MRN [id number]  PCP:  Eden Emms, NP   [facility] Providers Cardiologist:  Debbe Odea, MD     Referring MD: Eden Emms, NP   Chief Complaint  Patient presents with   Follow-up    6 month follow up visit. Patient is doing well on today, but stated that she experienced some shortness of breath. Meds reviewed.     History of Present Illness:    Brittney Pierce is a 73 y.o. female with a hx of hypertension, remote smoking history x 12 years, degenerative disc disease, chronic back pain presenting for follow-up.    Last seen due to elevated blood pressures, Lopressor was increased to 25 mg twice daily.  Also has a history of palpitations deemed secondary to stress/anxiety.  She states feeling much better with increased dose of metoprolol.  Very appreciative of medication changes.  Feels well, has no concerns at this time.  Prior notes/testing Echo 04/2023 EF 60 to 65%. Coronary CTA 10/24 no CAD, calcium score 0.   Past Medical History:  Diagnosis Date   DDD (degenerative disc disease), lumbar    Disorder of bone and cartilage, unspecified    Dysuria    Esophageal reflux    Lumbago    Osteoarthrosis, unspecified whether generalized or localized, unspecified site    Scoliosis    Ulcerative colitis, unspecified    Unspecified essential hypertension     Past Surgical History:  Procedure Laterality Date   CATARACT EXTRACTION, BILATERAL     08/2019 and 09/2019   CESAREAN SECTION     x2   CHOLECYSTECTOMY     LUMBAR DISC SURGERY  2008    Current Medications: Current Meds  Medication Sig   BIOTIN PO Take 1 capsule by mouth daily.    Calcium 1200-1000 MG-UNIT CHEW Chew 1 tablet by mouth daily.   cetirizine (ZYRTEC) 10 MG tablet Take 10 mg by mouth daily.   conjugated estrogens (PREMARIN) vaginal cream Use a pea sized amount on the urethra 2 times  per week.   Cranberry-Vitamin C-Vitamin E 4200-20-3 MG-MG-UNIT CAPS Take by mouth. 1 daily   D-MANNOSE PO Take 1,000 mg by mouth daily.   diclofenac sodium (VOLTAREN) 1 % GEL Apply topically as needed.    esomeprazole (NEXIUM) 40 MG capsule TAKE ONE CAPSULE BY MOUTH EVERY DAY - TAKE AT LEAST 1 HOUR BEFORE A MEAL   fluticasone (FLONASE) 50 MCG/ACT nasal spray 2 sprays each nostril as directed.   hydrocortisone 2.5 % cream Apply 1 Application topically 2 (two) times daily.   lidocaine (LIDODERM) 5 % Place 2 patches onto the skin daily. Remove & Discard patch within 12 hours or as directed by MD   meloxicam (MOBIC) 15 MG tablet Take 1 tablet (15 mg total) by mouth daily. with food   mesalamine (CANASA) 1000 MG suppository PLACE 1 SUPPOSITORY (1,000 MG TOTAL) RECTALLY AT BEDTIME AS NEEDED.   Multiple Vitamins-Minerals (MULTIVITAMIN PO) Take 1 tablet by mouth daily.   nitrofurantoin, macrocrystal-monohydrate, (MACROBID) 100 MG capsule Take 1 capsule (100 mg total) by mouth at bedtime.   POTASSIUM GLUCONATE PO Take 1 capsule by mouth daily.   pregabalin (LYRICA) 75 MG capsule Take 1 capsule (75 mg total) by mouth at bedtime.   Probiotic Product (PROBIOTIC DAILY PO) Take by mouth.   rosuvastatin (CRESTOR) 5 MG tablet Take 1 tablet (5 mg total) by mouth  daily.   traMADol (ULTRAM) 50 MG tablet Take 1 tablet (50 mg total) by mouth 2 (two) times daily as needed.   [DISCONTINUED] metoprolol tartrate (LOPRESSOR) 25 MG tablet Take 1 tablet (25 mg total) by mouth 2 (two) times daily.     Allergies:   Amoxicillin, Prednisone, Bactrim ds [sulfamethoxazole-trimethoprim], Codeine, Erythromycin, Fosamax [alendronate], and Sulfamethizole   Social History   Socioeconomic History   Marital status: Widowed    Spouse name: Not on file   Number of children: Not on file   Years of education: Not on file   Highest education level: Associate degree: academic program  Occupational History   Occupation: Retired   Tobacco Use   Smoking status: Former    Current packs/day: 0.00    Average packs/day: 1 pack/day for 13.0 years (13.0 ttl pk-yrs)    Types: Cigarettes    Start date: 07/28/1967    Quit date: 07/27/1980    Years since quitting: 43.2   Smokeless tobacco: Never  Vaping Use   Vaping status: Never Used  Substance and Sexual Activity   Alcohol use: Not Currently    Comment: Once a month (maybe)   Drug use: No   Sexual activity: Not Currently  Other Topics Concern   Not on file  Social History Narrative   Regular exercise-yes      Moved here from [facility] to be closer to grandchildrenLikes to garden   Does have a living will-but will need to update   Orvil Feil (daughter) or sons, are health care POA   .Would desire life support, no prolonged life support if futile.   Social Drivers of Corporate investment banker Strain: Low Risk  (08/14/2023)   Overall Financial Resource Strain (CARDIA)    Difficulty of Paying Living Expenses: Not very hard  Food Insecurity: No Food Insecurity (08/14/2023)   Hunger Vital Sign    Worried About Running Out of Food in the Last Year: Never true    Ran Out of Food in the Last Year: Never true  Transportation Needs: No Transportation Needs (08/14/2023)   PRAPARE - Administrator, Civil Service (Medical): No    Lack of Transportation (Non-Medical): No  Physical Activity: Insufficiently Active (08/14/2023)   Exercise Vital Sign    Days of Exercise per Week: 2 days    Minutes of Exercise per Session: 10 min  Stress: No Stress Concern Present (08/14/2023)   [employer] of Occupational Health - Occupational Stress Questionnaire    Feeling of Stress : Only a little  Social Connections: Moderately Integrated (08/14/2023)   Social Connection and Isolation Panel [NHANES]    Frequency of Communication with Friends and Family: Twice a week    Frequency of Social Gatherings with Friends and Family: Once a week    Attends Religious Services: More  than 4 times per year    Active Member of [employer] or Organizations: Yes    Attends Banker Meetings: More than 4 times per year    Marital Status: Widowed     Family History: The patient's family history includes Diabetes in her father; Heart attack (age of onset: 75) in her father; Lung cancer in her mother.  ROS:   Please see the history of present illness.     All other systems reviewed and are negative.  EKGs/Labs/Other Studies Reviewed:    The following studies were reviewed today:  EKG Interpretation Date/Time:  Friday October 22 2023 11:35:14 EDT Ventricular Rate:  71  PR Interval:  156 QRS Duration:  68 QT Interval:  370 QTC Calculation: 402 R Axis:   -4  Text Interpretation: Sinus rhythm with occasional Premature ventricular complexes and Premature atrial complexes Nonspecific ST and T wave abnormality Confirmed by Debbe Odea ([id number]) on 10/22/2023 11:40:51 AM    Recent Labs: 12/01/2022: ALT 15; Hemoglobin 13.8; Platelets 241.0; TSH 2.36 08/18/2023: BUN 19; Creatinine, Ser 1.08; Potassium 5.1; Sodium 135  Recent Lipid Panel    Component Value Date/Time   CHOL 126 01/08/2023 1144   TRIG 157.0 (H) 01/08/2023 1144   HDL 54.70 01/08/2023 1144   CHOLHDL 2 01/08/2023 1144   VLDL 31.4 01/08/2023 1144   LDLCALC 40 01/08/2023 1144     Risk Assessment/Calculations:               Physical Exam:    VS:  BP 132/88   Pulse 71   Ht 5' 3.5" (1.613 m)   Wt 178 lb 9.6 oz (81 kg)   SpO2 96%   BMI 31.14 kg/m     Wt Readings from Last 3 Encounters:  10/22/23 178 lb 9.6 oz (81 kg)  08/25/23 176 lb 9.6 oz (80.1 kg)  08/18/23 176 lb (79.8 kg)     GEN:  Well nourished, well developed in no acute distress HEENT: Normal NECK: No JVD; No carotid bruits CARDIAC: RRR, no murmurs, rubs, gallops RESPIRATORY:  Clear to auscultation without rales, wheezing or rhonchi  ABDOMEN: Soft, non-tender, non-distended MUSCULOSKELETAL:  No edema; No deformity  SKIN:  Warm and dry NEUROLOGIC:  Alert and oriented x 3 PSYCHIATRIC:  Normal affect   ASSESSMENT:    1. Primary hypertension   2. Medication refill    PLAN:    In order of problems listed above:  Hypertension, BP  controlled.  Continue Lopressor 25 mg twice daily.  Patient has a history of anxiety/stress, additional reason to use beta-blocker.    Follow-up as needed.     Medication Adjustments/Labs and Tests Ordered: Current medicines are reviewed at length with the patient today.  Concerns regarding medicines are outlined above.  Orders Placed This Encounter  Procedures   EKG 12-Lead   Meds ordered this encounter  Medications   metoprolol tartrate (LOPRESSOR) 25 MG tablet    Sig: Take 1 tablet (25 mg total) by mouth 2 (two) times daily.    Dispense:  180 tablet    Refill:  3    Patient Instructions  Medication Instructions:  No changes at this time.   *If you need a refill on your cardiac medications before your next appointment, please call your pharmacy*  Lab Work: None  If you have labs (blood work) drawn today and your tests are completely normal, you will receive your results only by: MyChart Message (if you have MyChart) OR A paper copy in the mail If you have any lab test that is abnormal or we need to change your treatment, we will call you to review the results.  Testing/Procedures: None  Follow-Up: At [facility], you and your health needs are our priority.  As part of our continuing mission to provide you with exceptional heart care, our providers are all part of one team.  This team includes your primary Cardiologist (physician) and Advanced Practice Providers or APPs (Physician Assistants and Nurse Practitioners) who all work together to provide you with the care you need, when you need it.  Your next appointment:   Follow up as needed.    Signed, Arlys John  Agbor-Etang, MD  10/22/2023 12:05 PM    [facility]

## 2023-11-16 ENCOUNTER — Ambulatory Visit (INDEPENDENT_AMBULATORY_CARE_PROVIDER_SITE_OTHER): Payer: Medicare Other | Admitting: Nurse Practitioner

## 2023-11-16 ENCOUNTER — Encounter: Payer: Self-pay | Admitting: Nurse Practitioner

## 2023-11-16 VITALS — BP 122/52 | HR 59 | Temp 98.2°F | Ht 63.5 in | Wt 178.0 lb

## 2023-11-16 DIAGNOSIS — M5440 Lumbago with sciatica, unspecified side: Secondary | ICD-10-CM

## 2023-11-16 DIAGNOSIS — G8929 Other chronic pain: Secondary | ICD-10-CM

## 2023-11-16 DIAGNOSIS — F1190 Opioid use, unspecified, uncomplicated: Secondary | ICD-10-CM

## 2023-11-16 NOTE — Assessment & Plan Note (Signed)
 Patient is using tramadol  50mg  BID PRN sparingly. On lyrica  75mg  at bedtime. She does take meloxicam  daily. Controlled substance agreement signed today. UDS ordered. She uses the medication in frequently and may not show in urine. She does not need a refill of tramadol  today.

## 2023-11-16 NOTE — Patient Instructions (Signed)
 Nice to see you today I want to see you in 3 months. We will do labs at that juncture.  Follow up with me sooner if you need me

## 2023-11-16 NOTE — Progress Notes (Signed)
 Established Patient Office Visit  Subjective   Patient ID: Brittney Pierce, female    DOB: [birth year]  Age: 73 y.o. MRN: [id number]  Chief Complaint  Patient presents with   Pain    Chronic pain f/u    HPI  Chronic lumbar pain: patient was recently seen by me on 08/18/2023. She has been evaluated by speciallist and is non operatble. She asked if I would take over the meidcations. I agreed. Patient is currently on tramadol  50mg  BID and lidocaine  patches along with meloxicam  .  States that everything is the same. State that the lidocaine  patches do help with the sleep. States that she can move her bowels on the tramadol . She takes it twice a week normally. She did have family over and took 2 over the wekeend. States that she still has pain but able to function.     Review of Systems  Gastrointestinal:  Negative for abdominal pain, diarrhea, nausea and vomiting.  Musculoskeletal:  Positive for back pain and joint pain.      Objective:     BP (!) 122/52 (BP Location: Left Arm, Patient Position: Sitting, Cuff Size: Normal)   Pulse (!) 59   Temp 98.2 F (36.8 C) (Oral)   Ht 5' 3.5" (1.613 m)   Wt 178 lb (80.7 kg)   SpO2 98%   BMI 31.04 kg/m  BP Readings from Last 3 Encounters:  11/16/23 (!) 122/52  10/22/23 132/88  08/25/23 (!) 144/84   Wt Readings from Last 3 Encounters:  11/16/23 178 lb (80.7 kg)  10/22/23 178 lb 9.6 oz (81 kg)  08/25/23 176 lb 9.6 oz (80.1 kg)   SpO2 Readings from Last 3 Encounters:  11/16/23 98%  10/22/23 96%  08/18/23 98%      Physical Exam Vitals and nursing note reviewed.  Constitutional:      Appearance: Normal appearance.  Cardiovascular:     Rate and Rhythm: Normal rate and regular rhythm.     Heart sounds: Normal heart sounds.  Pulmonary:     Effort: Pulmonary effort is normal.     Breath sounds: Normal breath sounds.  Musculoskeletal:        General: Tenderness present.     Lumbar back: Tenderness and bony tenderness  present.       Back:  Neurological:     Mental Status: She is alert.      No results found for any visits on 11/16/23.    The ASCVD Risk score (Arnett DK, et al., 2019) failed to calculate for the following reasons:   The valid total cholesterol range is 130 to 320 mg/dL    Assessment & Plan:   Problem List Items Addressed This Visit       Other   BACK PAIN, LUMBAR, CHRONIC - Primary   Patient is using tramadol  50mg  BID PRN sparingly. On lyrica  75mg  at bedtime. She does take meloxicam  daily. Controlled substance agreement signed today. UDS ordered. She uses the medication in frequently and may not show in urine. She does not need a refill of tramadol  today.      Relevant Orders   DRUG MONITORING, PANEL 8 WITH CONFIRMATION, URINE   Other Visit Diagnoses       Chronic, continuous use of opioids       Relevant Orders   DRUG MONITORING, PANEL 8 WITH CONFIRMATION, URINE       Return in about 3 months (around 02/15/2024) for DM recheck/chronic pain/labs .    [name],  NP

## 2023-11-17 LAB — DRUG MONITORING, PANEL 8 WITH CONFIRMATION, URINE
6 Acetylmorphine: NEGATIVE ng/mL (ref <10)
Alcohol Metabolites: NEGATIVE ng/mL (ref <500)
Amphetamines: NEGATIVE ng/mL (ref <500)
Benzodiazepines: NEGATIVE ng/mL (ref <100)
Buprenorphine, Urine: NEGATIVE ng/mL (ref <5)
Cocaine Metabolite: NEGATIVE ng/mL (ref <150)
Creatinine: 151.1 mg/dL (ref >=20.0)
MDMA: NEGATIVE ng/mL (ref <500)
Marijuana Metabolite: NEGATIVE ng/mL (ref <20)
Opiates: NEGATIVE ng/mL (ref <100)
Oxidant: NEGATIVE ug/mL (ref <200)
Oxycodone: NEGATIVE ng/mL (ref <100)
pH: 6.1 (ref 4.5–9.0)

## 2023-11-17 LAB — DM TEMPLATE

## 2023-12-15 ENCOUNTER — Other Ambulatory Visit: Payer: Self-pay | Admitting: Nurse Practitioner

## 2023-12-15 DIAGNOSIS — Z1231 Encounter for screening mammogram for malignant neoplasm of breast: Secondary | ICD-10-CM

## 2023-12-16 ENCOUNTER — Ambulatory Visit (INDEPENDENT_AMBULATORY_CARE_PROVIDER_SITE_OTHER): Admitting: Nurse Practitioner

## 2023-12-16 ENCOUNTER — Other Ambulatory Visit: Payer: Self-pay | Admitting: Nurse Practitioner

## 2023-12-16 VITALS — BP 126/82 | HR 67 | Temp 98.2°F | Ht 63.5 in | Wt 180.0 lb

## 2023-12-16 DIAGNOSIS — R2232 Localized swelling, mass and lump, left upper limb: Secondary | ICD-10-CM

## 2023-12-16 DIAGNOSIS — M549 Dorsalgia, unspecified: Secondary | ICD-10-CM

## 2023-12-16 DIAGNOSIS — Z1231 Encounter for screening mammogram for malignant neoplasm of breast: Secondary | ICD-10-CM

## 2023-12-16 DIAGNOSIS — Z760 Encounter for issue of repeat prescription: Secondary | ICD-10-CM

## 2023-12-16 DIAGNOSIS — G8929 Other chronic pain: Secondary | ICD-10-CM

## 2023-12-16 MED ORDER — HYDROCORTISONE 2.5 % EX CREA
1.0000 | TOPICAL_CREAM | Freq: Two times a day (BID) | CUTANEOUS | 0 refills | Status: AC
Start: 2023-12-16 — End: ?

## 2023-12-16 NOTE — Patient Instructions (Signed)
 Nice to see you today I will be in touch once I have the ultrasound results Follow up with me as scheduled  The clinic for pain management is Dr Rhesa Celeste

## 2023-12-16 NOTE — Assessment & Plan Note (Signed)
 History of the same.  Patient tried muscle relaxers in the past without great resolved.  She is currently maintained on tramadol  and lidocaine  patches.  Patient does use heat and ice.  She would like to be referred to interventional pain management to evaluate for injections again referral placed today.

## 2023-12-16 NOTE — Assessment & Plan Note (Signed)
 Palpable small mass in left axilla.  Tender to palpation.  No signs of infection.  Will obtain ultrasound.  Patient given information to call and set up ultrasound pending result

## 2023-12-16 NOTE — Progress Notes (Signed)
 Acute Office Visit  Subjective:     Patient ID: Brittney Pierce, female    DOB: 14-Mar-1951, 73 y.o.   MRN: [id number]  Chief Complaint  Patient presents with   Back Pain    Pt complains of need for referral to a different pain clinic in [place] area closer to her home. Pt complains of recent lower back burning and stinging pain.    Medication Refill    Hydrocortisone  cream.      Patient is in today for back pain  Patient was seen by me on 11/16/2023 for management of her chornic pain. She was being seen by ortho. She asked if I would take over her pain medications and I obliged   States that she does a lot of bending at home. States that she feels like she has another disc. States that there is a lump on her lower back pain. States that she saw her creatinine in her UDS and was concerned that it could be her kindeys  States that it started approx 2 weeks ago without a descrete injury. State that when she bends over to get the cat litter it will hurt worse. States that she feels that the sciatica nerve is more involved as of late.   States that she has a gland in her left [place]. States that she has been having pian fo rthe past 2-3 weeks. States at night it hurts worse. States that it is not red or any discharge. States that itis tender to the touch. It is hurting more at nihgt. States that if she lays on that side she can feel the lump pain that is described as aching and dulll. She is not sure if the tramadol  is helping with it. She did have a dose of tramadol  yesterday  Review of Systems  Constitutional:  Negative for chills and fever.  Respiratory:  Negative for shortness of breath.   Cardiovascular:  Negative for chest pain.  Musculoskeletal:  Positive for back pain.  Skin:        "+" mass  Neurological:  Negative for headaches.  Psychiatric/Behavioral:  Negative for hallucinations and suicidal ideas.         Objective:    BP 126/82   Pulse 67   Temp  98.2 F (36.8 C) (Oral)   Ht 5' 3.5" (1.613 m)   Wt 180 lb (81.6 kg)   SpO2 97%   BMI 31.39 kg/m    Physical Exam Vitals and nursing note reviewed.  Constitutional:      Appearance: Normal appearance.  Cardiovascular:     Rate and Rhythm: Normal rate and regular rhythm.     Heart sounds: Normal heart sounds.  Pulmonary:     Effort: Pulmonary effort is normal.     Breath sounds: Normal breath sounds.  Chest:    Musculoskeletal:        General: Tenderness present.       Arms:     Lumbar back: Tenderness present. No bony tenderness. Negative right straight leg raise test and negative left straight leg raise test.     Comments: Bilateral lower extremity strength 5/5  Neurological:     Mental Status: She is alert.     Deep Tendon Reflexes:     Reflex Scores:      Patellar reflexes are 2+ on the right side and 2+ on the left side.    No results found for any visits on 12/16/23.      Assessment &  Plan:   Problem List Items Addressed This Visit       Other   Medication refill   Relevant Medications   hydrocortisone  2.5 % cream   Mass of left axilla   Palpable small mass in left axilla.  Tender to palpation.  No signs of infection.  Will obtain ultrasound.  Patient given information to call and set up ultrasound pending result      Relevant Orders   US  AXILLA LEFT   Acute on chronic back pain - Primary   History of the same.  Patient tried muscle relaxers in the past without great resolved.  She is currently maintained on tramadol  and lidocaine  patches.  Patient does use heat and ice.  She would like to be referred to interventional pain management to evaluate for injections again referral placed today.      Relevant Orders   Ambulatory referral to Pain Clinic    Meds ordered this encounter  Medications   hydrocortisone  2.5 % cream    Sig: Apply 1 Application topically 2 (two) times daily.    Dispense:  30 g    Refill:  0    Supervising Provider:   Deri Fleet A [[id number]]    Return if symptoms worsen or fail to improve.  Brittney Shay, NP

## 2023-12-23 ENCOUNTER — Ambulatory Visit
Admission: RE | Admit: 2023-12-23 | Discharge: 2023-12-23 | Disposition: A | Discharge status: Home / Self Care | Source: Ambulatory Visit | Attending: Nurse Practitioner | Admitting: Nurse Practitioner

## 2023-12-23 DIAGNOSIS — R590 Localized enlarged lymph nodes: Secondary | ICD-10-CM | POA: Insufficient documentation

## 2023-12-23 DIAGNOSIS — R92313 Mammographic fatty tissue density, bilateral breasts: Secondary | ICD-10-CM | POA: Diagnosis not present

## 2023-12-23 DIAGNOSIS — N6320 Unspecified lump in the left breast, unspecified quadrant: Secondary | ICD-10-CM | POA: Diagnosis not present

## 2023-12-23 DIAGNOSIS — R2232 Localized swelling, mass and lump, left upper limb: Secondary | ICD-10-CM

## 2023-12-23 DIAGNOSIS — M79622 Pain in left upper arm: Secondary | ICD-10-CM | POA: Diagnosis not present

## 2023-12-23 DIAGNOSIS — N644 Mastodynia: Secondary | ICD-10-CM | POA: Insufficient documentation

## 2023-12-23 DIAGNOSIS — N6332 Unspecified lump in axillary tail of the left breast: Secondary | ICD-10-CM | POA: Diagnosis not present

## 2023-12-23 DIAGNOSIS — Z1231 Encounter for screening mammogram for malignant neoplasm of breast: Secondary | ICD-10-CM | POA: Diagnosis not present

## 2023-12-27 ENCOUNTER — Ambulatory Visit: Payer: Self-pay | Admitting: Nurse Practitioner

## 2023-12-28 ENCOUNTER — Encounter

## 2024-01-03 NOTE — Progress Notes (Unsigned)
 01/04/2024 9:57 PM   Brittney Pierce 1950-10-12 [id number]  Referring provider: Dorothe Gaster, NP [address] [place],  [place] [address]  Urological history: 1. rUTI's -Contributing factors of age, GSM, ulcerative colitis and diabetes -August 25, 2023, E.coli  -July 19, 2023, E. coli and mixed urogenital flora -July 05, 2023, E.coli -June 17, 2023, E. coli -June 08, 2023, E. Coli -Vaginal estrogen cream, cranberry tablets and d-mannose    No chief complaint on file.  HPI: Brittney Pierce is a 73 y.o. female who presents today for three month follow up.   Previous records reviewed.   She was started on Macrobid  100 mg daily for 90 days on 09/15/2023 for rUTI's.      PMH: Past Medical History:  Diagnosis Date   DDD (degenerative disc disease), lumbar    Disorder of bone and cartilage, unspecified    Dysuria    Esophageal reflux    Lumbago    Osteoarthrosis, unspecified whether generalized or localized, unspecified site    Scoliosis    Ulcerative colitis, unspecified    Unspecified essential hypertension     Surgical History: Past Surgical History:  Procedure Laterality Date   CATARACT EXTRACTION, BILATERAL     08/2019 and 09/2019   CESAREAN SECTION     x2   CHOLECYSTECTOMY     LUMBAR DISC SURGERY  2008    Home Medications:  Allergies as of 01/04/2024       Reactions   Amoxicillin  Diarrhea   Prednisone  Other (See Comments)   Sx's of heart attack - elevated BP, HR and chest tightness/pressure.   Bactrim  Ds [sulfamethoxazole -trimethoprim ] Other (See Comments)   Rash,itching,whelps, throat swelling and feels like lump in throat.   Codeine    REACTION: stomach upset   Erythromycin    REACTION: stomach upset   Fosamax  [alendronate ] Other (See Comments)   Throat felt raw.   Sulfamethizole Hives        Medication List        Accurate as of January 03, 2024  9:57 PM. If you have any questions, ask your nurse  or doctor.          BIOTIN PO Take 1 capsule by mouth daily.   Calcium  1200-1000 MG-UNIT Chew Chew 1 tablet by mouth daily.   cetirizine 10 MG tablet Commonly known as: ZYRTEC Take 10 mg by mouth daily.   Cranberry-Vitamin C-Vitamin E 4200-20-3 MG-MG-UNIT Caps Take by mouth. 1 daily   D-MANNOSE PO Take 1,000 mg by mouth daily.   diclofenac sodium 1 % Gel Commonly known as: VOLTAREN Apply topically as needed.   esomeprazole  40 MG capsule Commonly known as: NexIUM  TAKE ONE CAPSULE BY MOUTH EVERY DAY - TAKE AT LEAST 1 HOUR BEFORE A MEAL   fluticasone  50 MCG/ACT nasal spray Commonly known as: FLONASE  2 sprays each nostril as directed.   hydrocortisone  2.5 % cream Apply 1 Application topically 2 (two) times daily.   hydrOXYzine  10 MG tablet Commonly known as: ATARAX  Take 1 tablet (10 mg total) by mouth 3 (three) times daily as needed.   lidocaine  5 % Commonly known as: Lidoderm  Place 2 patches onto the skin daily. Remove & Discard patch within 12 hours or as directed by MD   meloxicam  15 MG tablet Commonly known as: MOBIC  Take 1 tablet (15 mg total) by mouth daily. with food   mesalamine  1000 MG suppository Commonly known as: CANASA  PLACE 1 SUPPOSITORY (1,000 MG TOTAL) RECTALLY AT BEDTIME  AS NEEDED.   metoprolol  tartrate 25 MG tablet Commonly known as: LOPRESSOR  Take 1 tablet (25 mg total) by mouth 2 (two) times daily.   MULTIVITAMIN PO Take 1 tablet by mouth daily.   nitrofurantoin  (macrocrystal-monohydrate) 100 MG capsule Commonly known as: Macrobid  Take 1 capsule (100 mg total) by mouth at bedtime.   POTASSIUM GLUCONATE PO Take 1 capsule by mouth daily.   pregabalin  75 MG capsule Commonly known as: LYRICA  Take 1 capsule (75 mg total) by mouth at bedtime.   Premarin  vaginal cream Generic drug: conjugated estrogens  Use a pea sized amount on the urethra 2 times per week.   PROBIOTIC DAILY PO Take by mouth.   rosuvastatin  5 MG tablet Commonly  known as: Crestor  Take 1 tablet (5 mg total) by mouth daily.   traMADol  50 MG tablet Commonly known as: ULTRAM  Take 1 tablet (50 mg total) by mouth 2 (two) times daily as needed.        Allergies:  Allergies  Allergen Reactions   Amoxicillin  Diarrhea   Prednisone  Other (See Comments)    Sx's of heart attack - elevated BP, HR and chest tightness/pressure.   Bactrim  Ds [Sulfamethoxazole -Trimethoprim ] Other (See Comments)    Rash,itching,whelps, throat swelling and feels like lump in throat.   Codeine     REACTION: stomach upset   Erythromycin     REACTION: stomach upset   Fosamax  [Alendronate ] Other (See Comments)    Throat felt raw.   Sulfamethizole Hives    Family History: Family History  Problem Relation Age of Onset   Heart attack Father 11   Diabetes Father    Lung cancer Mother     Social History:  reports that she quit smoking about 43 years ago. Her smoking use included cigarettes. She started smoking about 56 years ago. She has a 13 pack-year smoking history. She has never used smokeless tobacco. She reports that she does not currently use alcohol. She reports that she does not use drugs.  ROS: Pertinent ROS in HPI  Physical Exam: There were no vitals taken for this visit.  Constitutional:  Well nourished. Alert and oriented, No acute distress. HEENT: [place] AT, moist mucus membranes.  Trachea midline, no masses. Cardiovascular: No clubbing, cyanosis, or edema. Respiratory: Normal respiratory effort, no increased work of breathing. GU: No CVA tenderness.  No bladder fullness or masses.  Recession of labia minora, dry, pale vulvar vaginal mucosa and loss of mucosal ridges and folds.  Normal urethral meatus, no lesions, no prolapse, no discharge.   No urethral masses, tenderness and/or tenderness. No bladder fullness, tenderness or masses. *** vagina mucosa, *** estrogen effect, no discharge, no lesions, *** pelvic support, *** cystocele and *** rectocele noted.  No  cervical motion tenderness.  Uterus is freely mobile and non-fixed.  No adnexal/parametria masses or tenderness noted.  Anus and perineum are without rashes or lesions.   ***  Neurologic: Grossly intact, no focal deficits, moving all 4 extremities. Psychiatric: Normal mood and affect.    Laboratory Data: Urinalysis See EPIC and HPI  I have reviewed the labs.   Pertinent Imaging: N/A  Assessment & Plan:    1. rUTI's - ***  2.  Vaginal atrophy - ***                                              No follow-ups on  file.  These notes generated with voice recognition software. I apologize for typographical errors.  Briant Camper  [facility] [address]  [address] [place], [place] [address] [phone]

## 2024-01-04 ENCOUNTER — Encounter: Payer: Self-pay | Admitting: Urology

## 2024-01-04 ENCOUNTER — Ambulatory Visit (INDEPENDENT_AMBULATORY_CARE_PROVIDER_SITE_OTHER): Admitting: Urology

## 2024-01-04 ENCOUNTER — Encounter: Payer: Self-pay | Admitting: Internal Medicine

## 2024-01-04 VITALS — BP 138/80 | HR 72 | Ht 63.5 in | Wt 180.0 lb

## 2024-01-04 DIAGNOSIS — N952 Postmenopausal atrophic vaginitis: Secondary | ICD-10-CM

## 2024-01-04 DIAGNOSIS — N390 Urinary tract infection, site not specified: Secondary | ICD-10-CM

## 2024-01-04 DIAGNOSIS — R3129 Other microscopic hematuria: Secondary | ICD-10-CM | POA: Diagnosis not present

## 2024-01-04 LAB — BLADDER SCAN AMB NON-IMAGING

## 2024-01-04 MED ORDER — NITROFURANTOIN MONOHYD MACRO 100 MG PO CAPS: 100.0000 mg | ORAL_CAPSULE | Freq: Every day | ORAL | 0 refills | Status: DC

## 2024-01-05 ENCOUNTER — Other Ambulatory Visit: Payer: Self-pay

## 2024-01-05 DIAGNOSIS — N952 Postmenopausal atrophic vaginitis: Secondary | ICD-10-CM | POA: Diagnosis not present

## 2024-01-05 DIAGNOSIS — N390 Urinary tract infection, site not specified: Secondary | ICD-10-CM | POA: Diagnosis not present

## 2024-01-05 LAB — URINALYSIS, COMPLETE
Bilirubin, UA: NEGATIVE
Glucose, UA: NEGATIVE
Ketones, UA: NEGATIVE
Nitrite, UA: NEGATIVE
Protein,UA: NEGATIVE
Specific Gravity, UA: 1.015 (ref 1.005–1.030)
Urobilinogen, Ur: 0.2 mg/dL (ref 0.2–1.0)
pH, UA: 6 (ref 5.0–7.5)

## 2024-01-05 LAB — MICROSCOPIC EXAMINATION: WBC, UA: >30 /HPF — AB (ref 0–5)

## 2024-01-06 ENCOUNTER — Telehealth: Payer: Self-pay | Admitting: Urology

## 2024-01-06 NOTE — Telephone Encounter (Signed)
 Pt aware.   Appt made. IF she needs atb for pos culture pls send to [facility] on [address]. Thanks.

## 2024-01-06 NOTE — Telephone Encounter (Signed)
 Please let Brittney Pierce know that her urine did look like it had an infection and there was some microscopic blood.  I I have already prescribed Macrobid  empirically but hopefully will need to be adjusted once your culture results are available.  I do need to see her back in 2 months to recheck the urine to ensure the microscopic hematuria clears with the treatment of the UTI.

## 2024-01-07 ENCOUNTER — Other Ambulatory Visit: Payer: Self-pay | Admitting: Urology

## 2024-01-07 MED ORDER — NITROFURANTOIN MONOHYD MACRO 100 MG PO CAPS: 100.0000 mg | ORAL_CAPSULE | Freq: Two times a day (BID) | ORAL | 0 refills | Status: DC

## 2024-01-08 LAB — CULTURE, URINE COMPREHENSIVE

## 2024-01-13 ENCOUNTER — Ambulatory Visit: Payer: Self-pay | Admitting: Physician Assistant

## 2024-01-30 NOTE — Patient Instructions (Incomplete)
 ______________________________________________________________________    New Patients  Welcome to [facility].   Initial Visit The first or initial visit consists of an evaluation only.   Interventional pain management.  We offer therapies other than opioid controlled substances to manage chronic pain. These include, but are not limited to, diagnostic, therapeutic, and palliative specialized injection therapies (i.e.: Epidural Steroids, Facet Blocks, etc.). We specialize in a variety of nerve blocks as well as radiofrequency treatments. We offer pain implant evaluations and trials, as well as follow up management. In addition we also provide a variety joint injections, including Viscosupplementation (AKA: Gel Therapy).  Prescription Pain Medication. We specialize in alternatives to opioids. We can provide evaluations and recommendations for/of pharmacologic therapies based on CDC Guidelines.  We no longer take patients for long-term medication management. We will not be taking over your pain medications.  ______________________________________________________________________      ______________________________________________________________________    Patient Information update  To: All of our patients.  Re: Name change.  It has been made official that our current name, "[facility]"   will soon be changed to "[facility]".   The purpose of this change is to eliminate any confusion created by the concept of our practice being a "Medication Management Pain Clinic". In the past this has led to the misconception that we treat pain primarily by the use of prescription medications.  Nothing can be farther from the truth.   Understanding PAIN MANAGEMENT: To further understand what our practice does, you first have to  understand that "Pain Management" is a subspecialty that requires additional training once a physician has completed their specialty training, which can be in either Anesthesia, Neurology, Psychiatry, or Physical Medicine and Rehabilitation (PMR). Each one of these contributes to the final approach taken by each physician to the management of their patient's pain. To be a "Pain Management Specialist" you must have first completed one of the specialty trainings below.  Anesthesiologists - trained in clinical pharmacology and interventional techniques such as nerve blockade and regional as well as central neuroanatomy. They are trained to block pain before, during, and after surgical interventions.  Neurologists - trained in the diagnosis and pharmacological treatment of complex neurological conditions, such as Multiple Sclerosis, Parkinson's, spinal cord injuries, and other systemic conditions that may be associated with symptoms that may include but are not limited to pain. They tend to rely primarily on the treatment of chronic pain using prescription medications.  Psychiatrist - trained in conditions affecting the psychosocial wellbeing of patients including but not limited to depression, anxiety, schizophrenia, personality disorders, addiction, and other substance use disorders that may be associated with chronic pain. They tend to rely primarily on the treatment of chronic pain using prescription medications.   Physical Medicine and Rehabilitation (PMR) physicians, also known as physiatrists - trained to treat a wide variety of medical conditions affecting the brain, spinal cord, nerves, bones, joints, ligaments, muscles, and tendons. Their training is primarily aimed at treating patients that have suffered injuries that have caused severe physical impairment. Their training is primarily aimed at the physical therapy and rehabilitation of those patients. They may also work alongside orthopedic surgeons  or neurosurgeons using their expertise in assisting surgical patients to recover after their surgeries.  INTERVENTIONAL PAIN MANAGEMENT is sub-subspecialty of Pain Management.  Our physicians are Board-certified in Anesthesia, Pain Management, and Interventional Pain Management.  This meaning that  not only have they been trained and Board-certified in their specialty of Anesthesia, and subspecialty of Pain Management, but they have also received further training in the sub-subspecialty of Interventional Pain Management, in order to become Board-certified as INTERVENTIONAL PAIN MANAGEMENT SPECIALIST.    Mission: Our goal is to use our skills in  INTERVENTIONAL PAIN MANAGEMENT as alternatives to the chronic use of prescription opioid medications for the treatment of pain. To make this more clear, we have changed our name to reflect what we do and offer. We will continue to offer medication management assessment and recommendations, but we will not be taking over any patient's medication management.  ______________________________________________________________________    ______________________________________________________________________    TENS (Device can be purchased online, without prescription. Search: TENS 7000.) Transcutaneous electrical nerve stimulation (TENS) is a method of pain relief involving the use of a mild electrical current. A TENS machine is a small, battery-operated device that has leads connected to sticky pads called electrodes.   Rechargeable 9V batteries:     Electrode placement:   TENS UNIT SAFETY WARNING SHEET and INFORMATION INDICATIONS AND CONTRAINDICTIONS Read the operation manual before using the device. Freight forwarder ([place] ) restricts this device to sale by or on the order of a physician. Observe your physician's precise instructions and let him show you where to apply the electrodes. For a successful therapy, the correct application of the electrodes is an  important factor. Carefully write down the settings your physician recommended. Indications for use This device is a prescription device and only for symptomatic relief of chronic intractable pain. Contraindications:   Any electrode placement that applies current to the carotid sinus (neck) region.   Patients with implanted electronic devices (for example, a pacemaker) or metallic implants should not undertake.   Any electrode placement that causes current to flow transcerebrally (through the head). The use of unit whenever pain symptoms are undiagnosed, unit etiology is determined.   The use of TENS whenever pain syndromes are undiagnosed, until etiology is established.  WARNINGS AND PRECAUTIONS  Warnings:   The device must be kept out of reach of children.   The safety of device for use during pregnancy or delivery has not been established.   Do not place electrodes on front of the throat. This may result in spasms of the laryngeal and pharyngeal muscles.   Do not place the electrodes over the carotid nerve (side of neck below ear).   The device is not effective for pain of central origin (headaches).   The device may interfere with electronic monitoring equipment (such as ECG monitors and ECG alarms).   Electrodes should not be placed over the eyes, in the mouth, or internally.   These devices have no curative value.   TENS devices should be used only under the continued supervision of a physician.   TENS is a symptomatic treatment and as such suppresses the sensation of pain which would otherwise serve as a protective mechanism. Precautions/Adverse Reactions   Isolated cases of skin irritation may occur at the site of electrode placement following long-term application.   Stimulation should be stopped and electrodes removed until the cause of the irritation can be determined.   Effectiveness is highly dependent upon patient selection by a person qualified in the management of pain patients.   If the device  treatment becomes ineffective or unpleasant, stimulation should be discontinued until reevaluation by a physician/clinician.   Always turn the device off before applying or removing electrodes.   Skin irritation  and electrode burns are potential adverse reactions.  PURPOSE: A Transcutaneous Electrical Nerve Stimulator, or TENS, unit is designed to relieve post-operative, acute and chronic pain. It is used for pain caused by peripheral nerves and not central. TENS units are prescription-only devices.  OPERATION: TENS units work in a couple of ways. The first way they are thought to work is by a method called the [employer]. The [employer] states that our brains can only handle one stimulus at a time. When you have chronic pain, this pain signal is constantly being sent to your brain and recognized as pain. When an electrical stimulus is added to the area of pain the body feels this electrical stimulus, and since the brain can only handle one thing at a time, the pain is not transmitted to the brain. The second method thought to be part of TENS unit's success is by way of stimulating our own bodies to release their own natural painkillers. TENS units do not work for everyone and results may vary. Always follow the instructions and warnings in your user's manual.  USE: One of the most important tasks that must be performed is battery maintenance. If you are using a Engineering geologist, always fully charge it and fully deplete it before charging it again. These batteries can develop memories and by not performing this charging task correctly, your battery's life can be greatly diminished. If your battery does develop a memory you can help expand the memory by charging for 12 - 13 hours and then completely depleting the battery. Always prepare the skin before applying electrodes. Your skin should be clean and free of any lotions or creams. If you are using electrodes that use conductive gel, apply a  small, even layer over the electrode. For carbon, self-adhesive electrodes, apply a drop of water to the electrodes before applying to the skin. The electrodes attach to the lead wires and then the TENS unit. Always grasp the connector and not the cord when inserting or removing. When making adjustments, always make sure the unit's channels (1 and 2) are in the OFF position. The actual settings should be recommended and prescribed by your physician. Medical equipment suppliers don'tset or instruct users as to user settings. When you are using the BURST mode, the unit delivers a series of quick pulses followed by a rest. This cycle repeats itself frequently. Always have channels OFF before changing modes.  For MODULATION mode, the stimulation automatically varies the width of the pulse.  For CONVENTIONAL mode, the stimulation is constant. After the settings have been fine-tuned, set the timer to 30 or 60 minutes. Your physician should also prescribe the use time. When the lights become dim, it means your batteries should be replaced or recharged.  ACCESSORIES: The electrodes and lead wires can be obtained from your medical equipment supplier. Your medical equipment supplier can set up a recurring delivery to accommodate your needs. Electrodes should be replaced once a month and lead wires once every 6 months.  Video Tutorial https://youtu.be/[email or web address]?[email or web address]  ______________________________________________________________________

## 2024-01-30 NOTE — Progress Notes (Unsigned)
 PROVIDER NOTE: Interpretation of information contained herein should be left to medically-trained personnel. Specific patient instructions are provided elsewhere under Patient Instructions section of medical record. This document was created in part using AI and STT-dictation technology, any transcriptional errors that may result from this process are unintentional.  Patient: Brittney Pierce  Service: E/M Encounter  Provider: Eric DELENA Como, MD  DOB: 10/27/1950  Delivery: Face-to-face  Specialty: Interventional Pain Management  MRN: [id number]  Setting: Ambulatory outpatient facility  Specialty designation: 09  Type: New Patient  Location: Outpatient office facility  PCP: Wendee Lynwood HERO, NP  DOS: 01/31/2024    Referring Prov.: Wendee Lynwood HERO, NP   Primary Reason(s) for Visit: Encounter for initial evaluation of one or more chronic problems (new to examiner) potentially causing chronic pain, and posing a threat to normal musculoskeletal function. (Level of risk: High) CC: No chief complaint on file.  HPI  Brittney Pierce is a 73 y.o. year old, female patient, who comes for the first time to our practice referred by Wendee Lynwood HERO, NP for our initial evaluation of her chronic pain. She has Essential hypertension; GERD; Ulcerative colitis (HCC); Osteoarthritis; BACK PAIN, LUMBAR, CHRONIC; Recurrent UTI; Hyperglycemia; Grief; Preventative health care; Situational anxiety; Acute cystitis; Prediabetes; Age-related osteoporosis without current pathological fracture; Type 2 diabetes mellitus with hyperglycemia, without long-term current use of insulin (HCC); Acute non-recurrent frontal sinusitis; Medication refill; Acute pain of left shoulder; Impingement syndrome of left shoulder; Acute cystitis without hematuria; Need for Streptococcus pneumoniae vaccination; Abdominal pressure; Cystitis with hematuria; Acute non-recurrent pansinusitis; Osteopenia after menopause; Palpitations; Mass of left axilla;  and Acute on chronic back pain on their problem list. Today she comes in for evaluation of her No chief complaint on file.  Pain Assessment: Location:     Radiating:   Onset:   Duration:   Quality:   Severity:  /10 (subjective, self-reported pain score)  Effect on ADL:   Timing:   Modifying factors:   BP:    HR:    Onset and Duration: {Hx; Onset and Duration:210120511} Cause of pain: {Hx; Cause:210120521} Severity: {Pain Severity:210120502} Timing: {Symptoms; Timing:210120501} Aggravating Factors: {Causes; Aggravating pain factors:210120507} Alleviating Factors: {Causes; Alleviating Factors:210120500} Associated Problems: {Hx; Associated problems:210120515} Quality of Pain: {Hx; Symptom quality or Descriptor:210120531} Previous Examinations or Tests: {Hx; Previous examinations or test:210120529} Previous Treatments: {Hx; Previous Treatment:210120503}  Brittney Pierce is being evaluated for possible interventional pain management therapies for the treatment of her chronic pain.  Discussed the use of AI scribe software for clinical note transcription with the patient, who gave verbal consent to proceed.  History of Present Illness           ***  Brittney Pierce has been informed that this initial visit was an evaluation only.  On the follow up appointment I will go over the results, including ordered tests and available interventional therapies. At that time she will have the opportunity to decide whether to proceed with offered therapies or not. In the event that Brittney Pierce prefers avoiding interventional options, this will conclude our involvement in the case.  Medication management recommendations may be provided upon request.  Patient informed that diagnostic tests may be ordered to assist in identifying underlying causes, narrow the list of differential diagnoses and aid in determining candidacy for (or contraindications to) planned therapeutic interventions.  Historic  Controlled Substance Pharmacotherapy Review PMP and historical list of controlled substances: ***  Most recently prescribed controlled substance(s): Opioid Analgesic: *** MME/day: *** mg/day  Historical Monitoring:  The patient  reports no history of drug use. List of prior UDS Testing: No results found for: MDMA, COCAINSCRNUR, PCPSCRNUR, PCPQUANT, CANNABQUANT, THCU, ETH, CBDTHCR, D8THCCBX, D9THCCBX Historical Background Evaluation: [place] PMP: PDMP not reviewed this encounter. Review of the past 75-months conducted.             PMP NARX Score Report:  Narcotic: *** Sedative: *** Stimulant: *** [place] Department of public safety, offender search: Engineer, mining Information) Non-contributory Risk Assessment Profile: Aberrant behavior: None observed or detected today Risk factors for fatal opioid overdose: None identified today PMP NARX Overdose Risk Score: *** Fatal overdose hazard ratio (HR): Calculation deferred Non-fatal overdose hazard ratio (HR): Calculation deferred Risk of opioid abuse or dependence: 0.7-3.0% with doses <= 36 MME/day and 6.1-26% with doses >= 120 MME/day. Substance use disorder (SUD) risk level: See below Personal History of Substance Abuse (SUD-Substance use disorder):  Alcohol:    Illegal Drugs:    Rx Drugs:    ORT Risk Level calculation:    ORT Scoring interpretation table:  Score <3 = Low Risk for SUD  Score between 4-7 = Moderate Risk for SUD  Score >8 = High Risk for Opioid Abuse   PHQ-2 Depression Scale:  Total score:    PHQ-2 Scoring interpretation table: (Score and probability of major depressive disorder)  Score 0 = No depression  Score 1 = 15.4% Probability  Score 2 = 21.1% Probability  Score 3 = 38.4% Probability  Score 4 = 45.5% Probability  Score 5 = 56.4% Probability  Score 6 = 78.6% Probability   PHQ-9 Depression Scale:  Total score:    PHQ-9 Scoring interpretation table:  Score 0-4 = No depression  Score 5-9 = Mild  depression  Score 10-14 = Moderate depression  Score 15-19 = Moderately severe depression  Score 20-27 = Severe depression (2.4 times higher risk of SUD and 2.89 times higher risk of overuse)   Pharmacologic Plan: As per protocol, I have not taken over any controlled substance management, pending the results of ordered tests and/or consults.            Initial impression: Pending review of available data and ordered tests.  Meds   Current Outpatient Medications:    nitrofurantoin , macrocrystal-monohydrate, (MACROBID ) 100 MG capsule, Take 1 capsule (100 mg total) by mouth every 12 (twelve) hours., Disp: 14 capsule, Rfl: 0   BIOTIN PO, Take 1 capsule by mouth daily. , Disp: , Rfl:    Calcium  1200-1000 MG-UNIT CHEW, Chew 1 tablet by mouth daily., Disp: , Rfl:    cetirizine (ZYRTEC) 10 MG tablet, Take 10 mg by mouth daily., Disp: , Rfl:    conjugated estrogens  (PREMARIN ) vaginal cream, Use a pea sized amount on the urethra 2 times per week., Disp: 42.5 g, Rfl: 12   Cranberry-Vitamin C-Vitamin E 4200-20-3 MG-MG-UNIT CAPS, Take by mouth. 1 daily, Disp: , Rfl:    D-MANNOSE PO, Take 1,000 mg by mouth daily., Disp: , Rfl:    diclofenac sodium (VOLTAREN) 1 % GEL, Apply topically as needed. , Disp: , Rfl:    esomeprazole  (NEXIUM ) 40 MG capsule, TAKE ONE CAPSULE BY MOUTH EVERY DAY - TAKE AT LEAST 1 HOUR BEFORE A MEAL, Disp: 90 capsule, Rfl: 1   fluticasone  (FLONASE ) 50 MCG/ACT nasal spray, 2 sprays each nostril as directed., Disp: 48 g, Rfl: 1   hydrocortisone  2.5 % cream, Apply 1 Application topically 2 (two) times daily., Disp: 30 g, Rfl: 0   hydrOXYzine  (ATARAX ) 10 MG tablet, Take 1  tablet (10 mg total) by mouth 3 (three) times daily as needed., Disp: 30 tablet, Rfl: 0   lidocaine  (LIDODERM ) 5 %, Place 2 patches onto the skin daily. Remove & Discard patch within 12 hours or as directed by MD, Disp: 60 patch, Rfl: 3   meloxicam  (MOBIC ) 15 MG tablet, Take 1 tablet (15 mg total) by mouth daily. with  food, Disp: 90 tablet, Rfl: 1   mesalamine  (CANASA ) 1000 MG suppository, PLACE 1 SUPPOSITORY (1,000 MG TOTAL) RECTALLY AT BEDTIME AS NEEDED., Disp: 30 suppository, Rfl: 0   metoprolol  tartrate (LOPRESSOR ) 25 MG tablet, Take 1 tablet (25 mg total) by mouth 2 (two) times daily., Disp: 180 tablet, Rfl: 3   Multiple Vitamins-Minerals (MULTIVITAMIN PO), Take 1 tablet by mouth daily., Disp: , Rfl:    nitrofurantoin , macrocrystal-monohydrate, (MACROBID ) 100 MG capsule, Take 1 capsule (100 mg total) by mouth at bedtime., Disp: 90 capsule, Rfl: 0   POTASSIUM GLUCONATE PO, Take 1 capsule by mouth daily., Disp: , Rfl:    pregabalin  (LYRICA ) 75 MG capsule, Take 1 capsule (75 mg total) by mouth at bedtime., Disp: 30 capsule, Rfl: 2   Probiotic Product (PROBIOTIC DAILY PO), Take by mouth., Disp: , Rfl:    rosuvastatin  (CRESTOR ) 5 MG tablet, Take 1 tablet (5 mg total) by mouth daily., Disp: 90 tablet, Rfl: 2   traMADol  (ULTRAM ) 50 MG tablet, Take 1 tablet (50 mg total) by mouth 2 (two) times daily as needed., Disp: 60 tablet, Rfl: 0  Imaging Review  Cervical Imaging: Cervical MR wo contrast: No results found for this or any previous visit.  Cervical MR wo contrast: No results found for this or any previous visit.  Cervical MR w/wo contrast: No results found for this or any previous visit.  Cervical MR w contrast: No results found for this or any previous visit.  Cervical CT wo contrast: No results found for this or any previous visit.  Cervical CT w/wo contrast: No results found for this or any previous visit.  Cervical CT w/wo contrast: No results found for this or any previous visit.  Cervical CT w contrast: No results found for this or any previous visit.  Cervical CT outside: No results found for this or any previous visit.  Cervical DG 1 view: No results found for this or any previous visit.  Cervical DG 2-3 views: No results found for this or any previous visit.  Cervical DG F/E views: No  results found for this or any previous visit.  Cervical DG 2-3 clearing views: No results found for this or any previous visit.  Cervical DG Bending/F/E views: No results found for this or any previous visit.  Cervical DG complete: No results found for this or any previous visit.  Cervical DG Myelogram views: No results found for this or any previous visit.  Cervical DG Myelogram views: No results found for this or any previous visit.  Cervical Discogram views: No results found for this or any previous visit.   Shoulder Imaging: Shoulder-R MR w contrast: No results found for this or any previous visit.  Shoulder-L MR w contrast: No results found for this or any previous visit.  Shoulder-R MR w/wo contrast: No results found for this or any previous visit.  Shoulder-L MR w/wo contrast: No results found for this or any previous visit.  Shoulder-R MR wo contrast: No results found for this or any previous visit.  Shoulder-L MR wo contrast: No results found for this or any previous visit.  Shoulder-R CT w contrast: No results found for this or any previous visit.  Shoulder-L CT w contrast: No results found for this or any previous visit.  Shoulder-R CT w/wo contrast: No results found for this or any previous visit.  Shoulder-L CT w/wo contrast: No results found for this or any previous visit.  Shoulder-R CT wo contrast: No results found for this or any previous visit.  Shoulder-L CT wo contrast: No results found for this or any previous visit.  Shoulder-R DG Arthrogram: No results found for this or any previous visit.  Shoulder-L DG Arthrogram: No results found for this or any previous visit.  Shoulder-R DG 1 view: No results found for this or any previous visit.  Shoulder-L DG 1 view: No results found for this or any previous visit.  Shoulder-R DG: No results found for this or any previous visit.  Shoulder-L DG: Results for orders placed during the hospital encounter of  09/10/21  DG Shoulder Left  Narrative CLINICAL DATA:  Left shoulder pain related to picking up child 2-3 months ago, initial encounter.  EXAM: LEFT SHOULDER - 2+ VIEW  COMPARISON:  None.  FINDINGS: Mild degenerative changes of the acromioclavicular and glenohumeral joints are noted. No acute fracture or dislocation is seen. The underlying bony thorax is within normal limits. No soft tissue abnormality is seen.  IMPRESSION: Degenerative change without acute abnormality.   Electronically Signed By: Oneil Devonshire M.D. On: 09/11/2021 10:56   Thoracic Imaging: Thoracic MR wo contrast: No results found for this or any previous visit.  Thoracic MR wo contrast: No results found for this or any previous visit.  Thoracic MR w/wo contrast: No results found for this or any previous visit.  Thoracic MR w contrast: No results found for this or any previous visit.  Thoracic CT wo contrast: No results found for this or any previous visit.  Thoracic CT w/wo contrast: No results found for this or any previous visit.  Thoracic CT w/wo contrast: No results found for this or any previous visit.  Thoracic CT w contrast: No results found for this or any previous visit.  Thoracic DG 2-3 views: No results found for this or any previous visit.  Thoracic DG 4 views: No results found for this or any previous visit.  Thoracic DG: No results found for this or any previous visit.  Thoracic DG w/swimmers view: No results found for this or any previous visit.  Thoracic DG Myelogram views: No results found for this or any previous visit.  Thoracic DG Myelogram views: No results found for this or any previous visit.   Lumbosacral Imaging: Lumbar MR wo contrast: Results for orders placed during the hospital encounter of 01/13/19  MR LUMBAR SPINE WO CONTRAST  Narrative CLINICAL DATA:  Lumbar disc degeneration. Low back pain. Back surgery 2009  EXAM: MRI LUMBAR SPINE WITHOUT  CONTRAST  TECHNIQUE: Multiplanar, multisequence MR imaging of the lumbar spine was performed. No intravenous contrast was administered.  COMPARISON:  Lumbar radiographs 01/06/2017  FINDINGS: Segmentation:  Normal  Alignment:  Mild-to-moderate dextroscoliosis mid lumbar spine.  5 mm anterolisthesis L5-S1.  Mild retrolisthesis T12-L1 and L1-2.  Vertebrae:  Negative for fracture or mass.  Conus medullaris and cauda equina: Conus extends to the L1 level. Conus and cauda equina appear normal.  Paraspinal and other soft tissues: negative for paraspinous mass or adenopathy. Right renal cyst. Abdominal aorta normal in caliber  Disc levels:  T12-L1: Mild disc degeneration  L1-2: Asymmetric disc degeneration and spurring  on the left causing moderate to severe subarticular stenosis on the left. Mild left foraminal narrowing. Spinal canal adequate in size.  L2-3: Asymmetric disc degeneration and spurring on the left. Moderate subarticular stenosis on the left. Mild spinal stenosis. Mild facet degeneration bilaterally.  L3-4: Mild disc degeneration. Moderate facet degeneration. Mild spinal stenosis.  L4-5: Asymmetric disc degeneration and spurring on the right with moderate subarticular stenosis on the right. Facet degeneration right greater than left. Spinal canal adequate in size  L5-S1: Grade 1 anterolisthesis. Bilateral facet degeneration. Moderate subarticular stenosis on the right due to spurring.  IMPRESSION: Moderate dextroscoliosis. Multilevel degenerative change with disc degeneration and spurring. Left-sided subarticular stenosis L1-2 and L2-3. Right-sided subarticular stenosis L4-5 and L5-S1.   Electronically Signed By: Carlin Gaskins M.D. On: 01/14/2019 09:21  Lumbar MR wo contrast: No results found for this or any previous visit.  Lumbar MR w/wo contrast: No results found for this or any previous visit.  Lumbar MR w/wo contrast: No results found for this or  any previous visit.  Lumbar MR w contrast: No results found for this or any previous visit.  Lumbar CT wo contrast: No results found for this or any previous visit.  Lumbar CT w/wo contrast: No results found for this or any previous visit.  Lumbar CT w/wo contrast: No results found for this or any previous visit.  Lumbar CT w contrast: No results found for this or any previous visit.  Lumbar DG 1V: No results found for this or any previous visit.  Lumbar DG 1V (Clearing): No results found for this or any previous visit.  Lumbar DG 2-3V (Clearing): No results found for this or any previous visit.  Lumbar DG 2-3 views: No results found for this or any previous visit.  Lumbar DG (Complete) 4+V: Results for orders placed during the hospital encounter of 07/30/17  DG Lumbar Spine Complete  Narrative CLINICAL DATA:  Fall with back pain.  EXAM: LUMBAR SPINE - COMPLETE 4+ VIEW  COMPARISON:  01/06/2017  FINDINGS: Dextroconvex scoliosis of the lumbosacral spine with secondary extensive multilevel osteoarthritic changes with disc space narrowing, endplate sclerosis and remodeling of vertebral bodies. Moderate to severe posterior facet arthropathy.  No evidence of acute fracture.  IMPRESSION: Dextroconvex scoliosis with moderate to severe multilevel osteoarthritic changes of the lumbosacral spine.   Electronically Signed By: Dobrinka  Dimitrova M.D. On: 07/30/2017 16:37        Lumbar DG F/E views: No results found for this or any previous visit.        Lumbar DG Bending views: No results found for this or any previous visit.        Lumbar DG Myelogram views: No results found for this or any previous visit.  Lumbar DG Myelogram: No results found for this or any previous visit.  Lumbar DG Myelogram: No results found for this or any previous visit.  Lumbar DG Myelogram: No results found for this or any previous visit.  Lumbar DG Myelogram Lumbosacral: No results found  for this or any previous visit.  Lumbar DG Diskogram views: No results found for this or any previous visit.  Lumbar DG Diskogram views: No results found for this or any previous visit.  Lumbar DG Epidurogram OP: No results found for this or any previous visit.  Lumbar DG Epidurogram IP: No results found for this or any previous visit.   Sacroiliac Joint Imaging: Sacroiliac Joint DG: No results found for this or any previous visit.  Sacroiliac Joint MR  w/wo contrast: No results found for this or any previous visit.  Sacroiliac Joint MR wo contrast: No results found for this or any previous visit.   Spine Imaging: Whole Spine DG Myelogram views: No results found for this or any previous visit.  Whole Spine MR Mets screen: No results found for this or any previous visit.  Whole Spine MR Mets screen: No results found for this or any previous visit.  Whole Spine MR w/wo: No results found for this or any previous visit.  MRA Spinal Canal w/ cm: No results found for this or any previous visit.  MRA Spinal Canal wo/ cm: No results found for this or any previous visit.  MRA Spinal Canal w/wo cm: No results found for this or any previous visit.  Spine Outside MR Films: No results found for this or any previous visit.  Spine Outside CT Films: No results found for this or any previous visit.  CT-Guided Biopsy: No results found for this or any previous visit.  CT-Guided Needle Placement: No results found for this or any previous visit.  DG Spine outside: No results found for this or any previous visit.  IR Spine outside: No results found for this or any previous visit.  NM Spine outside: No results found for this or any previous visit.   Hip Imaging: Hip-R MR w contrast: No results found for this or any previous visit.  Hip-L MR w contrast: No results found for this or any previous visit.  Hip-R MR w/wo contrast: No results found for this or any previous visit.  Hip-L MR  w/wo contrast: No results found for this or any previous visit.  Hip-R MR wo contrast: No results found for this or any previous visit.  Hip-L MR wo contrast: No results found for this or any previous visit.  Hip-R CT w contrast: No results found for this or any previous visit.  Hip-L CT w contrast: No results found for this or any previous visit.  Hip-R CT w/wo contrast: No results found for this or any previous visit.  Hip-L CT w/wo contrast: No results found for this or any previous visit.  Hip-R CT wo contrast: No results found for this or any previous visit.  Hip-L CT wo contrast: No results found for this or any previous visit.  Hip-R DG 2-3 views: No results found for this or any previous visit.  Hip-L DG 2-3 views: No results found for this or any previous visit.  Hip-R DG Arthrogram: No results found for this or any previous visit.  Hip-L DG Arthrogram: No results found for this or any previous visit.  Hip-B DG Bilateral: No results found for this or any previous visit.  Hip-B DG Bilateral (5V): No results found for this or any previous visit.   Knee Imaging: Knee-R MR w contrast: No results found for this or any previous visit.  Knee-L MR w/o contrast: No results found for this or any previous visit.  Knee-R MR w/wo contrast: No results found for this or any previous visit.  Knee-L MR w/wo contrast: No results found for this or any previous visit.  Knee-R MR wo contrast: No results found for this or any previous visit.  Knee-L MR wo contrast: No results found for this or any previous visit.  Knee-R CT w contrast: No results found for this or any previous visit.  Knee-L CT w contrast: No results found for this or any previous visit.  Knee-R CT w/wo contrast: No results  found for this or any previous visit.  Knee-L CT w/wo contrast: No results found for this or any previous visit.  Knee-R CT wo contrast: No results found for this or any previous  visit.  Knee-L CT wo contrast: No results found for this or any previous visit.  Knee-R DG 1-2 views: No results found for this or any previous visit.  Knee-L DG 1-2 views: No results found for this or any previous visit.  Knee-R DG 3 views: No results found for this or any previous visit.  Knee-L DG 3 views: No results found for this or any previous visit.  Knee-R DG 4 views: No results found for this or any previous visit.  Knee-L DG 4 views: No results found for this or any previous visit.  Knee-R DG Arthrogram: No results found for this or any previous visit.  Knee-L DG Arthrogram: No results found for this or any previous visit.   Ankle Imaging: Ankle-R DG Complete: No results found for this or any previous visit.  Ankle-L DG Complete: No results found for this or any previous visit.   Foot Imaging: Foot-R DG Complete: No results found for this or any previous visit.  Foot-L DG Complete: No results found for this or any previous visit.   Elbow Imaging: Elbow-R DG Complete: No results found for this or any previous visit.  Elbow-L DG Complete: No results found for this or any previous visit.   Wrist Imaging: Wrist-R DG Complete: No results found for this or any previous visit.  Wrist-L DG Complete: No results found for this or any previous visit.   Hand Imaging: Hand-R DG Complete: No results found for this or any previous visit.  Hand-L DG Complete: No results found for this or any previous visit.   Complexity Note: Imaging results reviewed.                         ROS  Cardiovascular: {Hx; Cardiovascular History:210120525} Pulmonary or Respiratory: {Hx; Pumonary and/or Respiratory History:210120523} Neurological: {Hx; Neurological:210120504} Psychological-Psychiatric: {Hx; Psychological-Psychiatric History:210120512} Gastrointestinal: {Hx; Gastrointestinal:210120527} Genitourinary: {Hx; Genitourinary:210120506} Hematological: {Hx;  Hematological:210120510} Endocrine: {Hx; Endocrine history:210120509} Rheumatologic: {Hx; Rheumatological:210120530} Musculoskeletal: {Hx; Musculoskeletal:210120528} Work History: {Hx; Work history:210120514}  Allergies  Brittney Pierce is allergic to amoxicillin , prednisone , bactrim  ds [sulfamethoxazole -trimethoprim ], codeine, erythromycin, fosamax  [alendronate ], and sulfamethizole.  Laboratory Chemistry Profile   Renal Lab Results  Component Value Date   BUN 19 08/18/2023   CREATININE 1.08 08/18/2023   BCR 10 (L) 04/12/2023   GFR 50.57 (L) 08/18/2023   GFRAA >60 03/22/2019   GFRNONAA >60 03/22/2019   SPECGRAV 1.015 01/05/2024   PHUR 6.0 01/05/2024   PROTEINUR Negative 01/05/2024     Electrolytes Lab Results  Component Value Date   NA 135 08/18/2023   K 5.1 08/18/2023   CL 99 08/18/2023   CALCIUM  10.1 08/18/2023     Hepatic Lab Results  Component Value Date   AST 22 12/01/2022   ALT 15 12/01/2022   ALBUMIN 4.1 12/01/2022   ALKPHOS 68 12/01/2022     ID Lab Results  Component Value Date   SARSCOV2NAA NOT DETECTED 03/22/2019     Bone Lab Results  Component Value Date   VD25OH 70.39 08/18/2023     Endocrine Lab Results  Component Value Date   GLUCOSE 100 (H) 08/18/2023   GLUCOSEU Negative 01/05/2024   HGBA1C 6.0 (A) 08/18/2023   TSH 2.36 12/01/2022   FREET4 0.85 05/24/2012     Neuropathy Lab  Results  Component Value Date   VITAMINB12 529 12/01/2022   HGBA1C 6.0 (A) 08/18/2023     CNS No results found for: COLORCSF, APPEARCSF, RBCCOUNTCSF, WBCCSF, POLYSCSF, LYMPHSCSF, EOSCSF, PROTEINCSF, GLUCCSF, JCVIRUS, CSFOLI, IGGCSF, LABACHR, ACETBL   Inflammation (CRP: Acute  ESR: Chronic) No results found for: CRP, ESRSEDRATE, LATICACIDVEN   Rheumatology No results found for: RF, ANA, LABURIC, URICUR, LYMEIGGIGMAB, LYMEABIGMQN, HLAB27   Coagulation Lab Results  Component Value Date   PLT 241.0 12/01/2022      Cardiovascular Lab Results  Component Value Date   HGB 13.8 12/01/2022   HCT 40.5 12/01/2022     Screening Lab Results  Component Value Date   SARSCOV2NAA NOT DETECTED 03/22/2019   COVIDSOURCE NASOPHARYNGEAL 03/22/2019     Cancer No results found for: CEA, CA125, LABCA2   Allergens No results found for: ALMOND, APPLE, ASPARAGUS, AVOCADO, BANANA, BARLEY, BASIL, BAYLEAF, GREENBEAN, LIMABEAN, WHITEBEAN, BEEFIGE, REDBEET, BLUEBERRY, BROCCOLI, CABBAGE, MELON, CARROT, CASEIN, CASHEWNUT, CAULIFLOWER, CELERY     Note: Lab results reviewed.  PFSH  Drug: Brittney Pierce  reports no history of drug use. Alcohol:  reports that she does not currently use alcohol. Tobacco:  reports that she quit smoking about 43 years ago. Her smoking use included cigarettes. She started smoking about 56 years ago. She has a 13 pack-year smoking history. She has never used smokeless tobacco. Medical:  has a past medical history of DDD (degenerative disc disease), lumbar, Disorder of bone and cartilage, unspecified, Dysuria, Esophageal reflux, Lumbago, Osteoarthrosis, unspecified whether generalized or localized, unspecified site, Scoliosis, Ulcerative colitis, unspecified, and Unspecified essential hypertension. Family: family history includes Diabetes in her father; Heart attack (age of onset: 57) in her father; Lung cancer in her mother.  Past Surgical History:  Procedure Laterality Date   CATARACT EXTRACTION, BILATERAL     08/2019 and 09/2019   CESAREAN SECTION     x2   CHOLECYSTECTOMY     LUMBAR DISC SURGERY  2008   Active Ambulatory Problems    Diagnosis Date Noted   Essential hypertension 01/03/2010   GERD 01/03/2010   Ulcerative colitis (HCC) 01/03/2010   Osteoarthritis 01/03/2010   BACK PAIN, LUMBAR, CHRONIC 01/03/2010   Recurrent UTI 12/31/2010   Hyperglycemia 02/24/2011   Grief 04/28/2016   Preventative health care 05/03/2017    Situational anxiety 05/11/2017   Acute cystitis 06/10/2017   Prediabetes 11/24/2019   Age-related osteoporosis without current pathological fracture 06/06/2020   Type 2 diabetes mellitus with hyperglycemia, without long-term current use of insulin (HCC) 04/22/2021   Acute non-recurrent frontal sinusitis 04/22/2021   Medication refill 09/10/2021   Acute pain of left shoulder 09/10/2021   Impingement syndrome of left shoulder 09/10/2021   Acute cystitis without hematuria 11/06/2021   Need for Streptococcus pneumoniae vaccination 12/08/2021   Abdominal pressure 04/01/2022   Cystitis with hematuria 04/21/2022   Acute non-recurrent pansinusitis 07/08/2022   Osteopenia after menopause 07/08/2022   Palpitations 12/01/2022   Mass of left axilla 12/16/2023   Acute on chronic back pain 12/16/2023   Resolved Ambulatory Problems    Diagnosis Date Noted   Disorder of bone and cartilage 01/03/2010   Burning with urination 06/27/2010   Breast mass, left 11/07/2010   Routine general medical examination at a health care facility 02/24/2011   Atypical nevi 02/24/2011   Need for shingles vaccine 04/07/2011   Routine general medical examination at a health care facility 02/09/2014   Medicare annual wellness visit, subsequent 02/13/2014   Welcome to Medicare preventive visit 02/13/2014  Cellulitis and abscess of face 11/29/2014   Fear of travel with panic attacks 12/11/2014   Medicare annual wellness visit, subsequent 03/20/2015   Insomnia 04/28/2016   Cough 04/22/2021   Sore throat 04/22/2021   Strep pharyngitis 04/22/2021   Brain fog 12/01/2022   Fatigue 12/01/2022   Past Medical History:  Diagnosis Date   DDD (degenerative disc disease), lumbar    Disorder of bone and cartilage, unspecified    Dysuria    Osteoarthrosis, unspecified whether generalized or localized, unspecified site    Scoliosis    Ulcerative colitis, unspecified    Unspecified essential hypertension    Constitutional  Exam  General appearance: Well nourished, well developed, and well hydrated. In no apparent acute distress There were no vitals filed for this visit. BMI Assessment: Estimated body mass index is 31.39 kg/m as calculated from the following:   Height as of 01/04/24: 5' 3.5 (1.613 m).   Weight as of 01/04/24: 180 lb (81.6 kg).  BMI interpretation table: BMI level Category Range association with higher incidence of chronic pain  <18 kg/m2 Underweight   18.5-24.9 kg/m2 Ideal body weight   25-29.9 kg/m2 Overweight Increased incidence by 20%  30-34.9 kg/m2 Obese (Class I) Increased incidence by 68%  35-39.9 kg/m2 Severe obesity (Class II) Increased incidence by 136%  >40 kg/m2 Extreme obesity (Class III) Increased incidence by 254%   Patient's current BMI Ideal Body weight  There is no height or weight on file to calculate BMI. Patient weight not recorded   BMI Readings from Last 4 Encounters:  01/04/24 31.39 kg/m  12/16/23 31.39 kg/m  11/16/23 31.04 kg/m  10/22/23 31.14 kg/m   Wt Readings from Last 4 Encounters:  01/04/24 180 lb (81.6 kg)  12/16/23 180 lb (81.6 kg)  11/16/23 178 lb (80.7 kg)  10/22/23 178 lb 9.6 oz (81 kg)    Psych/Mental status: Alert, oriented x 3 (person, place, & time)       Eyes: PERLA Respiratory: No evidence of acute respiratory distress  Assessment  Primary Diagnosis & Pertinent Problem List: There were no encounter diagnoses.  Visit Diagnosis (New problems to examiner): No diagnosis found. Plan of Care (Initial workup plan)  Note: Brittney Pierce was reminded that as per protocol, today's visit has been an evaluation only. We have not taken over the patient's controlled substance management.  Problem-specific plan: Assessment and Plan            Lab Orders  No laboratory test(s) ordered today   Imaging Orders  No imaging studies ordered today   Referral Orders  No referral(s) requested today   Procedure Orders    No procedure(s)  ordered today   Pharmacotherapy (current): Medications ordered:  No orders of the defined types were placed in this encounter.  Medications administered during this visit: Brittney Pierce Pierce had no medications administered during this visit.   Analgesic Pharmacotherapy:  Opioid Analgesics: For patients currently taking or requesting to take opioid analgesics, in accordance with [place]  Medical Board Guidelines, we will assess their risks and indications for the use of these substances. After completing our evaluation, we may offer recommendations, but we no longer take patients for medication management. The prescribing physician will ultimately decide, based on his/her training and level of comfort whether to adopt any of the recommendations, including whether or not to prescribe such medicines.  Membrane stabilizer: To be determined at a later time  Muscle relaxant: To be determined at a later time  NSAID: To  be determined at a later time  Other analgesic(s): To be determined at a later time   Interventional management options: Ms. Pree was informed that there is no guarantee that she would be a candidate for interventional therapies. The decision will be based on the results of diagnostic studies, as well as Ms. Grigoryan risk profile.  Procedure(s) under consideration:  Pending results of ordered studies     Interventional Therapies  Risk Factors  Considerations  Medical Comorbidities:     Planned  Pending:      Under consideration:   Pending   Completed: (Analgesic benefit)1  None at this time   Therapeutic  Palliative (PRN) options:   None established   Completed by other providers:   None reported  1(Analgesic benefit): Expressed in percentage (%). (Local anesthetic[LA] +/- sedation  L.A.Local Anesthetic  Steroid benefit  Ongoing benefit)   Provider-requested follow-up: No follow-ups on file.  Future Appointments  Date Time Provider  Department Center  01/31/2024  2:00 PM Tanya Glisson, MD [facility] None  02/16/2024 11:20 AM Wendee Lynwood HERO, NP [facility]  03/09/2024  1:20 PM Helon Kirsch A, PA-C [facility] None  04/25/2024  1:40 PM [facility] ANNUAL WELLNESS VISIT 1 [facility]  08/04/2024 10:45 AM Stoioff, Glendia BROCKS, MD [facility] None   I discussed the assessment and treatment plan with the patient. The patient was provided an opportunity to ask questions and all were answered. The patient agreed with the plan and demonstrated an understanding of the instructions.  Patient advised to call back or seek an in-person evaluation if the symptoms or condition worsens.  Duration of encounter: *** minutes.  Total time on encounter, as per AMA guidelines included both the face-to-face and non-face-to-face time personally spent by the physician and/or other qualified health care professional(s) on the day of the encounter (includes time in activities that require the physician or other qualified health care professional and does not include time in activities normally performed by clinical staff). Physician's time may include the following activities when performed: Preparing to see the patient (e.g., pre-charting review of records, searching for previously ordered imaging, lab work, and nerve conduction tests) Review of prior analgesic pharmacotherapies. Reviewing PMP Interpreting ordered tests (e.g., lab work, imaging, nerve conduction tests) Performing post-procedure evaluations, including interpretation of diagnostic procedures Obtaining and/or reviewing separately obtained history Performing a medically appropriate examination and/or evaluation Counseling and educating the patient/family/caregiver Ordering medications, tests, or procedures Referring and communicating with other health care professionals (when not separately reported) Documenting clinical information in the electronic or other health record Independently  interpreting results (not separately reported) and communicating results to the patient/ family/caregiver Care coordination (not separately reported)  Note by: Glisson DELENA Tanya, MD (TTS and AI technology used. I apologize for any typographical errors that were not detected and corrected.) Date: 01/31/2024; Time: 1:25 PM

## 2024-01-31 ENCOUNTER — Encounter: Payer: Self-pay | Admitting: Pain Medicine

## 2024-01-31 ENCOUNTER — Ambulatory Visit: Admitting: Pain Medicine

## 2024-01-31 ENCOUNTER — Ambulatory Visit
Admission: RE | Admit: 2024-01-31 | Discharge: 2024-01-31 | Disposition: A | Discharge status: Home / Self Care | Source: Ambulatory Visit | Attending: Pain Medicine | Admitting: Pain Medicine

## 2024-01-31 VITALS — BP 139/77 | HR 78 | Temp 97.3°F | Ht 64.0 in | Wt 180.0 lb

## 2024-01-31 DIAGNOSIS — E1165 Type 2 diabetes mellitus with hyperglycemia: Secondary | ICD-10-CM | POA: Diagnosis not present

## 2024-01-31 DIAGNOSIS — M5441 Lumbago with sciatica, right side: Secondary | ICD-10-CM

## 2024-01-31 DIAGNOSIS — M899 Disorder of bone, unspecified: Secondary | ICD-10-CM | POA: Diagnosis not present

## 2024-01-31 DIAGNOSIS — M25551 Pain in right hip: Secondary | ICD-10-CM | POA: Diagnosis not present

## 2024-01-31 DIAGNOSIS — M5459 Other low back pain: Secondary | ICD-10-CM | POA: Diagnosis not present

## 2024-01-31 DIAGNOSIS — G894 Chronic pain syndrome: Secondary | ICD-10-CM | POA: Diagnosis not present

## 2024-01-31 DIAGNOSIS — G8929 Other chronic pain: Secondary | ICD-10-CM

## 2024-01-31 DIAGNOSIS — Z9889 Other specified postprocedural states: Secondary | ICD-10-CM | POA: Insufficient documentation

## 2024-01-31 DIAGNOSIS — M48061 Spinal stenosis, lumbar region without neurogenic claudication: Secondary | ICD-10-CM | POA: Diagnosis not present

## 2024-01-31 DIAGNOSIS — Z79899 Other long term (current) drug therapy: Secondary | ICD-10-CM | POA: Diagnosis not present

## 2024-01-31 DIAGNOSIS — Z794 Long term (current) use of insulin: Secondary | ICD-10-CM

## 2024-01-31 DIAGNOSIS — Z789 Other specified health status: Secondary | ICD-10-CM | POA: Insufficient documentation

## 2024-01-31 DIAGNOSIS — M51369 Other intervertebral disc degeneration, lumbar region without mention of lumbar back pain or lower extremity pain: Secondary | ICD-10-CM | POA: Diagnosis not present

## 2024-01-31 DIAGNOSIS — M4186 Other forms of scoliosis, lumbar region: Secondary | ICD-10-CM | POA: Diagnosis not present

## 2024-01-31 DIAGNOSIS — M438X6 Other specified deforming dorsopathies, lumbar region: Secondary | ICD-10-CM | POA: Diagnosis not present

## 2024-01-31 NOTE — Progress Notes (Signed)
 Safety precautions to be maintained throughout the outpatient stay will include: orient to surroundings, keep bed in low position, maintain call bell within reach at all times, provide assistance with transfer out of bed and ambulation.

## 2024-02-01 LAB — COMP. METABOLIC PANEL (12)
AST: 27 IU/L (ref 0–40)
Albumin: 4.6 g/dL (ref 3.8–4.8)
Alkaline Phosphatase: 64 IU/L (ref 44–121)
BUN/Creatinine Ratio: 14 (ref 12–28)
BUN: 14 mg/dL (ref 8–27)
Bilirubin Total: 0.3 mg/dL (ref 0.0–1.2)
Calcium: 9.9 mg/dL (ref 8.7–10.3)
Chloride: 100 mmol/L (ref 96–106)
Creatinine, Ser: 1.02 mg/dL — ABNORMAL HIGH (ref 0.57–1.00)
Globulin, Total: 2.7 g/dL (ref 1.5–4.5)
Glucose: 111 mg/dL — ABNORMAL HIGH (ref 70–99)
Potassium: 4.9 mmol/L (ref 3.5–5.2)
Sodium: 135 mmol/L (ref 134–144)
Total Protein: 7.3 g/dL (ref 6.0–8.5)
eGFR: 57 mL/min/1.73 — ABNORMAL LOW (ref >59)

## 2024-02-01 LAB — MAGNESIUM: Magnesium: 1.9 mg/dL (ref 1.6–2.3)

## 2024-02-01 LAB — SEDIMENTATION RATE: Sed Rate: 15 mm/h (ref 0–40)

## 2024-02-01 LAB — C-REACTIVE PROTEIN: CRP: <1 mg/L (ref 0–10)

## 2024-02-01 LAB — HEMOGLOBIN A1C
Est. average glucose Bld gHb Est-mCnc: 131 mg/dL
Hgb A1c MFr Bld: 6.2 % — ABNORMAL HIGH (ref 4.8–5.6)

## 2024-02-01 LAB — VITAMIN B12: Vitamin B-12: 913 pg/mL (ref 232–1245)

## 2024-02-02 LAB — MED LIST ATTACHED SEPARATELY

## 2024-02-03 LAB — COMPLIANCE DRUG ANALYSIS, UR

## 2024-02-16 ENCOUNTER — Ambulatory Visit (INDEPENDENT_AMBULATORY_CARE_PROVIDER_SITE_OTHER): Admitting: Nurse Practitioner

## 2024-02-16 ENCOUNTER — Encounter: Payer: Self-pay | Admitting: Nurse Practitioner

## 2024-02-16 VITALS — BP 128/82 | HR 68 | Temp 97.9°F | Ht 63.31 in | Wt 183.0 lb

## 2024-02-16 DIAGNOSIS — E1165 Type 2 diabetes mellitus with hyperglycemia: Secondary | ICD-10-CM

## 2024-02-16 DIAGNOSIS — Z0000 Encounter for general adult medical examination without abnormal findings: Secondary | ICD-10-CM

## 2024-02-16 DIAGNOSIS — Z760 Encounter for issue of repeat prescription: Secondary | ICD-10-CM

## 2024-02-16 DIAGNOSIS — H9319 Tinnitus, unspecified ear: Secondary | ICD-10-CM | POA: Insufficient documentation

## 2024-02-16 DIAGNOSIS — G8929 Other chronic pain: Secondary | ICD-10-CM | POA: Insufficient documentation

## 2024-02-16 DIAGNOSIS — I10 Essential (primary) hypertension: Secondary | ICD-10-CM | POA: Diagnosis not present

## 2024-02-16 DIAGNOSIS — M545 Low back pain, unspecified: Secondary | ICD-10-CM

## 2024-02-16 DIAGNOSIS — K5190 Ulcerative colitis, unspecified, without complications: Secondary | ICD-10-CM

## 2024-02-16 DIAGNOSIS — G894 Chronic pain syndrome: Secondary | ICD-10-CM

## 2024-02-16 LAB — LIPID PANEL
Cholesterol: 136 mg/dL (ref 0–200)
HDL: 60.1 mg/dL (ref >39.00)
LDL Cholesterol: 43 mg/dL (ref 0–99)
NonHDL: 75.77
Total CHOL/HDL Ratio: 2
Triglycerides: 166 mg/dL — ABNORMAL HIGH (ref 0.0–149.0)
VLDL: 33.2 mg/dL (ref 0.0–40.0)

## 2024-02-16 LAB — TSH: TSH: 1.43 u[IU]/mL (ref 0.35–5.50)

## 2024-02-16 LAB — MICROALBUMIN / CREATININE URINE RATIO
Creatinine,U: 76.9 mg/dL
Microalb Creat Ratio: UNDETERMINED mg/g (ref 0.0–30.0)
Microalb, Ur: <0.7 mg/dL

## 2024-02-16 MED ORDER — LIDOCAINE 5 % EX PTCH: 2.0000 | MEDICATED_PATCH | CUTANEOUS | 3 refills | Status: AC

## 2024-02-16 MED ORDER — TRAMADOL HCL 50 MG PO TABS
50.0000 mg | ORAL_TABLET | Freq: Two times a day (BID) | ORAL | 0 refills | Status: DC | PRN
Start: 2024-02-16 — End: 2024-05-08

## 2024-02-16 MED ORDER — MELOXICAM 15 MG PO TABS: 15.0000 mg | ORAL_TABLET | Freq: Every day | ORAL | 1 refills | Status: AC

## 2024-02-16 MED ORDER — ESOMEPRAZOLE MAGNESIUM 40 MG PO CPDR: DELAYED_RELEASE_CAPSULE | ORAL | 2 refills | Status: AC

## 2024-02-16 MED ORDER — PREGABALIN 75 MG PO CAPS: 75.0000 mg | ORAL_CAPSULE | Freq: Every evening | ORAL | 1 refills | Status: AC

## 2024-02-16 MED ORDER — ROSUVASTATIN CALCIUM 5 MG PO TABS
5.0000 mg | ORAL_TABLET | Freq: Every day | ORAL | 2 refills | Status: AC
Start: 2024-02-16 — End: ?

## 2024-02-16 NOTE — Assessment & Plan Note (Signed)
 Chronic and increasing.  Patient would like evaluated.  In which referral to ENT today

## 2024-02-16 NOTE — Patient Instructions (Signed)
 Nice to see you today Follow up with me in 6 months, sooner if you need me

## 2024-02-16 NOTE — Assessment & Plan Note (Signed)
 Currently doing the Lyrica  75 mg nightly.  Noted patient is being followed by interventional pain management

## 2024-02-16 NOTE — Assessment & Plan Note (Signed)
 Patient currently maintained on metoprolol  25 mg twice daily and follow with cardiology.  Blood pressure well-controlled.  Continue medication as prescribed

## 2024-02-16 NOTE — Assessment & Plan Note (Signed)
 Discussed age-appropriate immunizations and screening exams.  Did review patient's personal, surgical, social, family histories.  Patient is up-to-date on all age-appropriate vaccinations she would like.  Patient with an like to no longer pursue CRC screening.  She is aged out of cervical cancer screening.  Patient is up-to-date on DEXA and mammogram.  Patient was given information at discharge about preventative healthcare maintenance with anticipatory guidance.

## 2024-02-16 NOTE — Assessment & Plan Note (Signed)
 Is followed by interventional pain management.  Currently maintained on tramadol  twice daily as needed.  Last prescription filled for me was approximate 6 months ago.  Patient also uses Lyrica  75 mg nightly.  Refills provided today PDMP reviewed

## 2024-02-16 NOTE — Progress Notes (Signed)
 Established Patient Office Visit  Subjective   Patient ID: Brittney Pierce, female    DOB: [birth year]  Age: 73 y.o. MRN: [id number]  Chief Complaint  Patient presents with   Follow-up    DM, chronic pain and labs      HPI  HLD: Patient currently maintained on rosuvastatin  5 mg daily  GERD: Patient currently maintained on omeprazole  40 mg.  Does well on medication  HTN: Patient currently maintained on metoprolol  25 mg twice daily is followed by cardiology  Recurrent UTI: Patient currently maintained on Premarin  vaginal cream and Macrobid  nightly.  She is followed by urology  Allergies: Currently maintained on Zyrtec 10 mg daily  DM2: Currently maintained on lifestyle modifications.  Most recent A1c controlled at 6.2%  Chronic pain: Patient was maintained on tramadol  50 mg twice daily along with lidocaine  5% patches.  Recently seen by interventional pain management and is under workup for possible spinal injection  for complete physical and follow up of chronic conditions.  Immunizations: -Tetanus: Completed in 2021 -Influenza: Out of season -Shingles: Completed Shingrix series -Pneumonia: Completed Prevnar 20  Diet: Fair diet. She is eating 2 melas a day and will snacks in between. She is drinking water, juice, and coke zero  Exercise: No regular exercise. She will go to the grocery store. She does have a lot of ortho pain   Eye exam:  needs updating. Wears glasees  Dental exam: Dentures prn     Colonoscopy: Completed in 2010, aged out?  Patient does not want to pursue Lung Cancer Screening: N/A  Pap Smear: Aged out  Mammogram: 12/23/2023 with repeat in 1 year  DEXA: 08/12/2023 with osteopenia, repeat 2 years  Advanced directive: does have one   Sleep: gets around 6 hours of sleep a night. She has gotten up to 10 hours. States that she will wake up several titmes just because or to urinate        Review of Systems  Constitutional:  Negative for  chills and fever.  Respiratory:  Negative for shortness of breath.   Cardiovascular:  Negative for chest pain and leg swelling.  Gastrointestinal:  Negative for abdominal pain, blood in stool, constipation, diarrhea, nausea and vomiting.       BM daily   Genitourinary:  Negative for dysuria and hematuria.  Neurological:  Negative for tingling and headaches.  Psychiatric/Behavioral:  Negative for hallucinations and suicidal ideas.    Title   Diabetic Foot Exam - detailed Is there a history of foot ulcer?: No Is there a foot ulcer now?: No Is there swelling?: No Is there elevated skin temperature?: No Is there abnormal foot shape?: No Is there a claw toe deformity?: No Are the toenails long?: No Are the toenails thick?: No Are the toenails ingrown?: No Pulse Foot Exam completed.: Yes   Right Posterior Tibialis: Present Left posterior Tibialis: Present   Right Dorsalis Pedis: Present Left Dorsalis Pedis: Present     Sensory Foot Exam Completed.: Yes Semmes-Weinstein Monofilament Test + means has sensation and - means no sensation      Image components are not supported.   Image components are not supported. Image components are not supported.  Tuning Fork Comments All 10 sites tested sensation intact   1-3 decreased on bilateral feet        Objective:     BP 128/82   Pulse 68   Temp 97.9 F (36.6 C) (Oral)   Ht 5' 3.31 (1.608 m)   Wt  183 lb (83 kg)   SpO2 97%   BMI 32.10 kg/m  BP Readings from Last 3 Encounters:  02/16/24 128/82  01/31/24 139/77  01/04/24 138/80   Wt Readings from Last 3 Encounters:  02/16/24 183 lb (83 kg)  01/31/24 180 lb (81.6 kg)  01/04/24 180 lb (81.6 kg)   SpO2 Readings from Last 3 Encounters:  02/16/24 97%  01/31/24 97%  12/16/23 97%      Physical Exam Vitals and nursing note reviewed.  Constitutional:      Appearance: Normal appearance.  HENT:     Right Ear: Tympanic membrane, ear canal and external ear  normal.     Left Ear: Tympanic membrane, ear canal and external ear normal.     Mouth/Throat:     Mouth: Mucous membranes are moist.     Pharynx: Oropharynx is clear.  Eyes:     Extraocular Movements: Extraocular movements intact.     Pupils: Pupils are equal, round, and reactive to light.  Cardiovascular:     Rate and Rhythm: Normal rate and regular rhythm.     Pulses: Normal pulses.     Heart sounds: Normal heart sounds.  Pulmonary:     Effort: Pulmonary effort is normal.     Breath sounds: Normal breath sounds.  Abdominal:     General: Bowel sounds are normal. There is no distension.     Palpations: There is no mass.     Tenderness: There is no abdominal tenderness.     Hernia: No hernia is present.  Musculoskeletal:     Right lower leg: No edema.     Left lower leg: No edema.  Lymphadenopathy:     Cervical: No cervical adenopathy.  Skin:    General: Skin is warm.  Neurological:     General: No focal deficit present.     Mental Status: She is alert.     Deep Tendon Reflexes:     Reflex Scores:      Bicep reflexes are 2+ on the right side and 2+ on the left side.      Patellar reflexes are 2+ on the right side and 2+ on the left side.    Comments: Bilateral upper and lower extremity strength 5/5  Psychiatric:        Mood and Affect: Mood normal.        Behavior: Behavior normal.        Thought Content: Thought content normal.        Judgment: Judgment normal.      No results found for any visits on 02/16/24.    The ASCVD Risk score (Arnett DK, et al., 2019) failed to calculate for the following reasons:   The valid total cholesterol range is 130 to 320 mg/dL    Assessment & Plan:   Problem List Items Addressed This Visit       Cardiovascular and Mediastinum   Essential hypertension   Patient currently maintained on metoprolol  25 mg twice daily and follow with cardiology.  Blood pressure well-controlled.  Continue medication as prescribed      Relevant  Medications   rosuvastatin  (CRESTOR ) 5 MG tablet   Other Relevant Orders   TSH     Digestive   Ulcerative colitis (HCC)   History of the same.  Patient has been in remission for extended period of time.  Does have mesalamine  suppositories at home if needed.  Stable        Endocrine   Type 2 diabetes  mellitus with hyperglycemia, without long-term current use of insulin (HCC)   Currently maintained on lifestyle modifications only.  Patient's last A1c was 6.2%.  Continue working lifestyle modifications no antidiabetic medications required at this juncture      Relevant Medications   rosuvastatin  (CRESTOR ) 5 MG tablet   Other Relevant Orders   Lipid panel   Microalbumin / creatinine urine ratio     Other   Chronic pain syndrome (Chronic)   Is followed by interventional pain management.  Currently maintained on tramadol  twice daily as needed.  Last prescription filled for me was approximate 6 months ago.  Patient also uses Lyrica  75 mg nightly.  Refills provided today PDMP reviewed      Relevant Medications   meloxicam  (MOBIC ) 15 MG tablet   pregabalin  (LYRICA ) 75 MG capsule   traMADol  (ULTRAM ) 50 MG tablet   Preventative health care - Primary   Discussed age-appropriate immunizations and screening exams.  Did review patient's personal, surgical, social, family histories.  Patient is up-to-date on all age-appropriate vaccinations she would like.  Patient with an like to no longer pursue CRC screening.  She is aged out of cervical cancer screening.  Patient is up-to-date on DEXA and mammogram.  Patient was given information at discharge about preventative healthcare maintenance with anticipatory guidance.      Medication refill   Relevant Medications   esomeprazole  (NEXIUM ) 40 MG capsule   meloxicam  (MOBIC ) 15 MG tablet   Chronic low back pain without sciatica   Currently doing the Lyrica  75 mg nightly.  Noted patient is being followed by interventional pain management       Relevant Medications   lidocaine  (LIDODERM ) 5 %   meloxicam  (MOBIC ) 15 MG tablet   pregabalin  (LYRICA ) 75 MG capsule   traMADol  (ULTRAM ) 50 MG tablet   Tinnitus   Chronic and increasing.  Patient would like evaluated.  In which referral to ENT today      Relevant Orders   Ambulatory referral to ENT    Return in about 6 months (around 08/18/2024) for BP recheck, chronic pain .    Adina Crandall, NP

## 2024-02-16 NOTE — Assessment & Plan Note (Signed)
 Currently maintained on lifestyle modifications only.  Patient's last A1c was 6.2%.  Continue working lifestyle modifications no antidiabetic medications required at this juncture

## 2024-02-16 NOTE — Assessment & Plan Note (Signed)
 History of the same.  Patient has been in remission for extended period of time.  Does have mesalamine  suppositories at home if needed.  Stable

## 2024-02-17 ENCOUNTER — Ambulatory Visit: Payer: Self-pay | Admitting: Nurse Practitioner

## 2024-02-20 NOTE — Progress Notes (Unsigned)
 PROVIDER NOTE: Interpretation of information contained herein should be left to medically-trained personnel. Specific patient instructions are provided elsewhere under Patient Instructions section of medical record. This document was created in part using AI and STT-dictation technology, any transcriptional errors that may result from this process are unintentional.  Patient: Brittney Pierce  Service: E/M   PCP: Wendee Lynwood HERO, NP  DOB: Aug 13, 1950  DOS: 02/21/2024  Provider: Eric DELENA Como, MD  MRN: [id number]  Delivery: Face-to-face  Specialty: Interventional Pain Management  Type: Established Patient  Setting: Ambulatory outpatient facility  Specialty designation: 09  Referring Prov.: Wendee Lynwood HERO, NP  Location: Outpatient office facility       Primary Reason(s) for Visit: Encounter for evaluation before starting new chronic pain management plan of care (Level of risk: moderate) CC: No chief complaint on file.  HPI  [name] is a 73 y.o. year old, female patient, who comes today for a follow-up evaluation to review the test results and decide on a treatment plan. She has Essential hypertension; GERD; Ulcerative colitis (HCC); Osteoarthritis; Chronic low back pain (Bilateral) (R>L) w/ sciatica (Right); Recurrent UTI; Hyperglycemia; Grief; Preventative health care; Situational anxiety; Acute cystitis; Age-related osteoporosis without current pathological fracture; Type 2 diabetes mellitus with hyperglycemia, without long-term current use of insulin (HCC); Acute non-recurrent frontal sinusitis; Medication refill; Acute pain of left shoulder; Impingement syndrome of left shoulder; Acute cystitis without hematuria; Need for Streptococcus pneumoniae vaccination; Cystitis with hematuria; Acute non-recurrent pansinusitis; Osteopenia after menopause; Palpitations; Mass of left axilla; Acute on chronic back pain; Chronic pain syndrome; Pharmacologic therapy; Disorder of skeletal system;  Problems influencing health status; Chronic hip pain (Right); History of back surgery; Lumbar facet joint pain; Chronic low back pain without sciatica; and Tinnitus on their problem list. Her primarily concern today is the No chief complaint on file.  Pain Assessment: Location:     Radiating:   Onset:   Duration:   Quality:   Severity:  /10 (subjective, self-reported pain score)  Effect on ADL:   Timing:   Modifying factors:   BP:    HR:    Ms. Mcghee comes in today for a follow-up visit after her initial evaluation on 01/31/2024. Today we went over the results of her tests. These were explained in Layman's terms. During today's appointment we went over my diagnostic impression, as well as the proposed treatment plan.  Review of initial evaluation (01/31/2024): [name] is a 73 year old female with scoliosis, degenerative disc disease, spinal stenosis, arthritis, osteoporosis, and sciatica who presents with worsening back and leg pain.   She has chronic back pain that has recently worsened, with radiation from the buttocks down the back of the right thigh, stopping midway between the buttocks and knee. The pain returns to the buttocks and extends into the thigh, similar to her pre-discectomy symptoms in 2009. The pain does not reach the knee. No pain, numbness, or weakness below the knees, although she has neuropathy in her foot.   She has received injections for back pain in the past, which initially provided relief but ceased to be effective about three to four years ago. No injections in the past year. Current medications include Lyrica  for foot neuropathy, lidocaine  patches for her back, and tramadol , which she feels is not very effective. She takes tramadol  two tablets at a time, once or twice a week, and Mobic  15 mg daily for arthritis.   Her medical history includes scoliosis, degenerative disc disease, spinal stenosis,  arthritis, osteoporosis, and sciatica.  She underwent a discectomy in 2009 for severe sciatic nerve pain that extended into her toes. Her last imaging studies were conducted a couple of years ago, and she has not had a nerve conduction study of her legs.   She experiences numbness and burning in her feet, with occasional electric shock sensations, attributed to neuropathy. The neuropathy is worse in one foot than the other. She takes Lyrica  once daily, which makes her feel 'zombie-like' if taken in higher doses. She previously took gabapentin but found it less effective than Lyrica .   Her back pain is mostly on the right side and sometimes extends across her lower back. Walking and standing for prolonged periods exacerbate her pain, and she cannot walk much. She has tried physical therapy in the past without significant benefit. She uses a TENS unit at home but has not used it recently for her current pain.  Review of diagnostic test ordered on 01/31/2024:  Diagnostic lab work: *** Diagnostic imaging: ***  Discussed the use of AI scribe software for clinical note transcription with the patient, who gave verbal consent to proceed.  History of Present Illness          Patient presented with interventional treatment options. Ms. Beed was informed that I will not be providing medication management. Pharmacotherapy evaluation including recommendations may be offered, if specifically requested.   Controlled Substance Pharmacotherapy Assessment REMS (Risk Evaluation and Mitigation Strategy)  Opioid Analgesic: None MME/day: 0 mg/day   Pill Count: None expected due to no prior prescriptions written by our practice. No notes on file  Pharmacokinetics: Liberation and absorption (onset of action): WNL Distribution (time to peak effect): WNL Metabolism and excretion (duration of action): WNL         Pharmacodynamics: Desired effects: Analgesia: Ms. Lucci reports >50% benefit. Functional ability: Patient reports that  medication allows her to accomplish basic ADLs Clinically meaningful improvement in function (CMIF): Sustained CMIF goals met Perceived effectiveness: Described as relatively effective, allowing for increase in activities of daily living (ADL) Undesirable effects: Side-effects or Adverse reactions: None reported Monitoring: [place] PMP: PDMP not reviewed this encounter. Online review of the past 86-month period previously conducted. Not applicable at this point since we have not taken over the patient's medication management yet. List of other Serum/Urine Drug Screening Test(s):  No results found for: AMPHSCRSER, BARBSCRSER, BENZOSCRSER, COCAINSCRSER, COCAINSCRNUR, PCPSCRSER, THCSCRSER, THCU, CANNABQUANT, OPIATESCRSER, OXYSCRSER, PROPOXSCRSER, ETH, CBDTHCR, D8THCCBX, D9THCCBX List of all UDS test(s) done:  Lab Results  Component Value Date   SUMMARY FINAL 01/31/2024   Last UDS on record: Summary  Date Value Ref Range Status  01/31/2024 FINAL  Corrected    Comment:    ==================================================================== Compliance Drug Analysis, Ur ==================================================================== Test                             Result       Flag       Units  Drug Present and Declared for Prescription Verification   Tramadol                        >6098        EXPECTED   ng/mg creat   O-Desmethyltramadol            2837         EXPECTED   ng/mg creat   N-Desmethyltramadol  2487         EXPECTED   ng/mg creat    Source of tramadol  is a prescription medication. O-desmethyltramadol    and N-desmethyltramadol are expected metabolites of tramadol .    Pregabalin                      PRESENT      EXPECTED   Lidocaine                       PRESENT      EXPECTED   Metoprolol                      PRESENT      EXPECTED  Drug Present not Declared for Prescription Verification   Acetaminophen                  PRESENT       UNEXPECTED   Diphenhydramine                 PRESENT      UNEXPECTED  Drug Absent but Declared for Prescription Verification   Diclofenac                     Not Detected UNEXPECTED    Diclofenac, as indicated in the declared medication list, is not    always detected even when used as directed.    Hydroxyzine                     Not Detected UNEXPECTED ==================================================================== Test                      Result    Flag   Units      Ref Range   Creatinine              82               mg/dL      >=79 ==================================================================== Declared Medications:  The flagging and interpretation on this report are based on the  following declared medications.  Unexpected results may arise from  inaccuracies in the declared medications.   **Note: The testing scope of this panel includes these medications:   Hydroxyzine  (Atarax )  Metoprolol  (Lopressor )  Pregabalin  (Lyrica )  Tramadol  (Ultram )   **Note: The testing scope of this panel does not include small to  moderate amounts of these reported medications:   Diclofenac (Voltaren)  Topical Lidocaine  (Lidoderm )   **Note: The testing scope of this panel does not include the  following reported medications:   Biotin  Calcium   Cetirizine (Zyrtec)  Cranberry  Esomeprazole  (Nexium )  Estrogens  (Premarin )  Fluticasone  (Flonase )  Hydrocortisone   Meloxicam  (Mobic )  Mesalamine   Multivitamin  Nitrofurantoin  (Macrobid )  Probiotic  Rosuvastatin  (Crestor )  Supplement  Vitamin C  Vitamin E ==================================================================== For clinical consultation, please call [phone]. ====================================================================    UDS interpretation: No unexpected findings.          Medication Assessment Form: Not applicable. No opioids. Treatment compliance: Not applicable Risk Assessment Profile: Aberrant  behavior: See initial evaluations. None observed or detected today Comorbid factors increasing risk of overdose: See initial evaluation. No additional risks detected today Opioid risk tool (ORT):     01/31/2024    2:13 PM  Opioid Risk   Alcohol 0  Illegal Drugs 0  Rx Drugs 0  Alcohol 0  Illegal Drugs 0  Rx Drugs 0  Age  between 16-45 years  0  History of Preadolescent Sexual Abuse 0  Psychological Disease 0  Depression 0  Opioid Risk Tool Scoring 0  Opioid Risk Interpretation Low Risk    ORT Scoring interpretation table:  Score <3 = Low Risk for SUD  Score between 4-7 = Moderate Risk for SUD  Score >8 = High Risk for Opioid Abuse   Risk of substance use disorder (SUD): Low  Risk Mitigation Strategies:  Patient opioid safety counseling: No controlled substances prescribed. Patient-Prescriber Agreement (PPA): No agreement signed.  Controlled substance notification to other providers: None required. No opioid therapy.  Pharmacologic Plan: Non-opioid analgesic therapy offered. Interventional alternatives discussed.             Laboratory Chemistry Profile   Renal Lab Results  Component Value Date   BUN 14 01/31/2024   CREATININE 1.02 (H) 01/31/2024   BCR 14 01/31/2024   GFR 50.57 (L) 08/18/2023   GFRAA >60 03/22/2019   GFRNONAA >60 03/22/2019   SPECGRAV 1.015 01/05/2024   PHUR 6.0 01/05/2024   PROTEINUR Negative 01/05/2024     Electrolytes Lab Results  Component Value Date   NA 135 01/31/2024   K 4.9 01/31/2024   CL 100 01/31/2024   CALCIUM  9.9 01/31/2024   MG 1.9 01/31/2024     Hepatic Lab Results  Component Value Date   AST 27 01/31/2024   ALT 15 12/01/2022   ALBUMIN 4.6 01/31/2024   ALKPHOS 64 01/31/2024     ID Lab Results  Component Value Date   SARSCOV2NAA NOT DETECTED 03/22/2019     Bone Lab Results  Component Value Date   VD25OH 70.39 08/18/2023     Endocrine Lab Results  Component Value Date   GLUCOSE 111 (H) 01/31/2024   GLUCOSEU  Negative 01/05/2024   HGBA1C 6.2 (H) 01/31/2024   TSH 1.43 02/16/2024   FREET4 0.85 05/24/2012     Neuropathy Lab Results  Component Value Date   VITAMINB12 913 01/31/2024   HGBA1C 6.2 (H) 01/31/2024     CNS No results found for: COLORCSF, APPEARCSF, RBCCOUNTCSF, WBCCSF, POLYSCSF, LYMPHSCSF, EOSCSF, PROTEINCSF, GLUCCSF, JCVIRUS, CSFOLI, IGGCSF, LABACHR, ACETBL   Inflammation (CRP: Acute  ESR: Chronic) Lab Results  Component Value Date   CRP <1 01/31/2024   ESRSEDRATE 15 01/31/2024     Rheumatology No results found for: RF, ANA, LABURIC, URICUR, LYMEIGGIGMAB, LYMEABIGMQN, HLAB27   Coagulation Lab Results  Component Value Date   PLT 241.0 12/01/2022     Cardiovascular Lab Results  Component Value Date   HGB 13.8 12/01/2022   HCT 40.5 12/01/2022     Screening Lab Results  Component Value Date   SARSCOV2NAA NOT DETECTED 03/22/2019   COVIDSOURCE NASOPHARYNGEAL 03/22/2019     Cancer No results found for: CEA, CA125, LABCA2   Allergens No results found for: ALMOND, APPLE, ASPARAGUS, AVOCADO, BANANA, BARLEY, BASIL, BAYLEAF, GREENBEAN, LIMABEAN, WHITEBEAN, BEEFIGE, REDBEET, BLUEBERRY, BROCCOLI, CABBAGE, MELON, CARROT, CASEIN, CASHEWNUT, CAULIFLOWER, CELERY     Note: Lab results reviewed.  Recent Diagnostic Imaging Review  Shoulder Imaging: Shoulder-L DG: Results for orders placed during the hospital encounter of 09/10/21 DG Shoulder Left  Narrative CLINICAL DATA:  Left shoulder pain related to picking up child 2-3 months ago, initial encounter.  EXAM: LEFT SHOULDER - 2+ VIEW  COMPARISON:  None.  FINDINGS: Mild degenerative changes of the acromioclavicular and glenohumeral joints are noted. No acute fracture or dislocation is seen. The underlying bony thorax is within normal limits. No soft tissue abnormality is  seen.  IMPRESSION: Degenerative change without acute  abnormality.   Electronically Signed By: Oneil Devonshire M.D. On: 09/11/2021 10:56  Lumbosacral Imaging: Lumbar MR wo contrast: Results for orders placed during the hospital encounter of 01/13/19 MR LUMBAR SPINE WO CONTRAST  Narrative CLINICAL DATA:  Lumbar disc degeneration. Low back pain. Back surgery 2009  EXAM: MRI LUMBAR SPINE WITHOUT CONTRAST  TECHNIQUE: Multiplanar, multisequence MR imaging of the lumbar spine was performed. No intravenous contrast was administered.  COMPARISON:  Lumbar radiographs 01/06/2017  FINDINGS: Segmentation:  Normal  Alignment:  Mild-to-moderate dextroscoliosis mid lumbar spine.  5 mm anterolisthesis L5-S1.  Mild retrolisthesis T12-L1 and L1-2.  Vertebrae:  Negative for fracture or mass.  Conus medullaris and cauda equina: Conus extends to the L1 level. Conus and cauda equina appear normal.  Paraspinal and other soft tissues: negative for paraspinous mass or adenopathy. Right renal cyst. Abdominal aorta normal in caliber  Disc levels:  T12-L1: Mild disc degeneration  L1-2: Asymmetric disc degeneration and spurring on the left causing moderate to severe subarticular stenosis on the left. Mild left foraminal narrowing. Spinal canal adequate in size.  L2-3: Asymmetric disc degeneration and spurring on the left. Moderate subarticular stenosis on the left. Mild spinal stenosis. Mild facet degeneration bilaterally.  L3-4: Mild disc degeneration. Moderate facet degeneration. Mild spinal stenosis.  L4-5: Asymmetric disc degeneration and spurring on the right with moderate subarticular stenosis on the right. Facet degeneration right greater than left. Spinal canal adequate in size  L5-S1: Grade 1 anterolisthesis. Bilateral facet degeneration. Moderate subarticular stenosis on the right due to spurring.  IMPRESSION: Moderate dextroscoliosis. Multilevel degenerative change with disc degeneration and spurring. Left-sided subarticular  stenosis L1-2 and L2-3. Right-sided subarticular stenosis L4-5 and L5-S1.   Electronically Signed By: Carlin Gaskins M.D. On: 01/14/2019 09:21  Lumbar DG Bending views: Results for orders placed during the hospital encounter of 01/31/24 DG Lumbar Spine Complete W/Bend  Narrative CLINICAL DATA:  History of back surgery. Lumbar facet joint pain. Patient reports low back pain.  EXAM: LUMBAR SPINE - COMPLETE WITH BENDING VIEWS  COMPARISON:  Lumbar radiograph 07/30/2017  FINDINGS: There are 6 non-rib-bearing lumbar vertebra, the lower most fully formed disc space will be labeled L5. Progressive dextroscoliotic curvature of the lumbar spine centered at L2-L3. there is 15 mm right lateral translation of L3 on L4 due to scoliosis. No definite listhesis. Limited range of motion, but no evidence of instability on flexion or extension. Degenerative disease with disc space narrowing and spurring throughout, most prominently affecting L1-L2 and L2-L3. There is prominent L3-L4, L4-L5, and L5-S1 facet hypertrophy.  IMPRESSION: 1. Progressive dextroscoliotic curvature of the lumbar spine since 2019. 2. Multilevel degenerative disc disease and lower lumbar facet hypertrophy. 3. No evidence of instability on flexion or extension.   Electronically Signed By: Andrea Gasman M.D. On: 02/01/2024 17:07  Hip Imaging: Hip-R DG 2-3 views: Results for orders placed during the hospital encounter of 01/31/24 DG HIP UNILAT W OR W/O PELVIS 2-3 VIEWS RIGHT  Narrative CLINICAL DATA:  Chronic hip pain, right.  EXAM: DG HIP (WITH OR WITHOUT PELVIS) 2-3V RIGHT  COMPARISON:  None Available.  FINDINGS: The hip joint spaces preserved. There is mild acetabular spurring. No fracture. No evidence of erosion or avascular necrosis. Pubic rami are intact. Pubic symphysis and sacroiliac joints are congruent. Unremarkable soft tissues.  IMPRESSION: Mild acetabular spurring.   Electronically  Signed By: Andrea Gasman M.D. On: 02/01/2024 17:05  Complexity Note: Imaging results reviewed.  Meds   Current Outpatient Medications:    BIOTIN PO, Take 1 capsule by mouth daily. , Disp: , Rfl:    Calcium  1200-1000 MG-UNIT CHEW, Chew 1 tablet by mouth daily., Disp: , Rfl:    cetirizine (ZYRTEC) 10 MG tablet, Take 10 mg by mouth daily., Disp: , Rfl:    conjugated estrogens  (PREMARIN ) vaginal cream, Use a pea sized amount on the urethra 2 times per week., Disp: 42.5 g, Rfl: 12   Cranberry-Vitamin C-Vitamin E 4200-20-3 MG-MG-UNIT CAPS, Take by mouth. 1 daily, Disp: , Rfl:    D-MANNOSE PO, Take 1,000 mg by mouth daily., Disp: , Rfl:    diclofenac sodium (VOLTAREN) 1 % GEL, Apply topically as needed. , Disp: , Rfl:    esomeprazole  (NEXIUM ) 40 MG capsule, TAKE ONE CAPSULE BY MOUTH EVERY DAY - TAKE AT LEAST 1 HOUR BEFORE A MEAL, Disp: 90 capsule, Rfl: 2   fluticasone  (FLONASE ) 50 MCG/ACT nasal spray, 2 sprays each nostril as directed., Disp: 48 g, Rfl: 1   hydrocortisone  2.5 % cream, Apply 1 Application topically 2 (two) times daily., Disp: 30 g, Rfl: 0   hydrOXYzine  (ATARAX ) 10 MG tablet, Take 1 tablet (10 mg total) by mouth 3 (three) times daily as needed., Disp: 30 tablet, Rfl: 0   lidocaine  (LIDODERM ) 5 %, Place 2 patches onto the skin daily. Remove & Discard patch within 12 hours or as directed by MD, Disp: 180 patch, Rfl: 3   meloxicam  (MOBIC ) 15 MG tablet, Take 1 tablet (15 mg total) by mouth daily. with food, Disp: 90 tablet, Rfl: 1   mesalamine  (CANASA ) 1000 MG suppository, PLACE 1 SUPPOSITORY (1,000 MG TOTAL) RECTALLY AT BEDTIME AS NEEDED., Disp: 30 suppository, Rfl: 0   metoprolol  tartrate (LOPRESSOR ) 25 MG tablet, Take 1 tablet (25 mg total) by mouth 2 (two) times daily., Disp: 180 tablet, Rfl: 3   Multiple Vitamins-Minerals (MULTIVITAMIN PO), Take 1 tablet by mouth daily., Disp: , Rfl:    nitrofurantoin , macrocrystal-monohydrate, (MACROBID ) 100 MG capsule,  Take 1 capsule (100 mg total) by mouth at bedtime., Disp: 90 capsule, Rfl: 0   pregabalin  (LYRICA ) 75 MG capsule, Take 1 capsule (75 mg total) by mouth at bedtime., Disp: 90 capsule, Rfl: 1   Probiotic Product (PROBIOTIC DAILY PO), Take by mouth., Disp: , Rfl:    rosuvastatin  (CRESTOR ) 5 MG tablet, Take 1 tablet (5 mg total) by mouth daily., Disp: 90 tablet, Rfl: 2   traMADol  (ULTRAM ) 50 MG tablet, Take 1 tablet (50 mg total) by mouth 2 (two) times daily as needed., Disp: 60 tablet, Rfl: 0  ROS  Constitutional: Denies any fever or chills Gastrointestinal: No reported hemesis, hematochezia, vomiting, or acute GI distress Musculoskeletal: Denies any acute onset joint swelling, redness, loss of ROM, or weakness Neurological: No reported episodes of acute onset apraxia, aphasia, dysarthria, agnosia, amnesia, paralysis, loss of coordination, or loss of consciousness  Allergies  Ms. Kersting is allergic to amoxicillin , prednisone , bactrim  ds [sulfamethoxazole -trimethoprim ], codeine, erythromycin, fosamax  [alendronate ], and sulfamethizole.  PFSH  Drug: Ms. Chervenak  reports no history of drug use. Alcohol:  reports that she does not currently use alcohol. Tobacco:  reports that she quit smoking about 43 years ago. Her smoking use included cigarettes. She started smoking about 56 years ago. She has a 13 pack-year smoking history. She has never used smokeless tobacco. Medical:  has a past medical history of DDD (degenerative disc disease), lumbar, Disorder of bone and cartilage, unspecified, Dysuria, Esophageal reflux, Lumbago, Osteoarthrosis, unspecified whether generalized  or localized, unspecified site, Scoliosis, Ulcerative colitis, unspecified, and Unspecified essential hypertension. Surgical: Ms. Severtson  has a past surgical history that includes Lumbar disc surgery (2008); Cesarean section; Cholecystectomy; and Cataract extraction, bilateral. Family: family history includes Diabetes in her  father; Heart attack (age of onset: 37) in her father; Lung cancer in her mother.  Constitutional Exam  General appearance: Well nourished, well developed, and well hydrated. In no apparent acute distress There were no vitals filed for this visit. BMI Assessment: Estimated body mass index is 32.1 kg/m as calculated from the following:   Height as of 02/16/24: 5' 3.31 (1.608 m).   Weight as of 02/16/24: 183 lb (83 kg).  BMI interpretation table: BMI level Category Range association with higher incidence of chronic pain  <18 kg/m2 Underweight   18.5-24.9 kg/m2 Ideal body weight   25-29.9 kg/m2 Overweight Increased incidence by 20%  30-34.9 kg/m2 Obese (Class I) Increased incidence by 68%  35-39.9 kg/m2 Severe obesity (Class II) Increased incidence by 136%  >40 kg/m2 Extreme obesity (Class III) Increased incidence by 254%   Patient's current BMI Ideal Body weight  There is no height or weight on file to calculate BMI. Ideal body weight: 53.1 kg (117 lb 1.2 oz) Adjusted ideal body weight: 65.1 kg (143 lb 7.1 oz)   BMI Readings from Last 4 Encounters:  02/16/24 32.10 kg/m  01/31/24 30.90 kg/m  01/04/24 31.39 kg/m  12/16/23 31.39 kg/m   Wt Readings from Last 4 Encounters:  02/16/24 183 lb (83 kg)  01/31/24 180 lb (81.6 kg)  01/04/24 180 lb (81.6 kg)  12/16/23 180 lb (81.6 kg)    Psych/Mental status: Alert, oriented x 3 (person, place, & time)       Eyes: PERLA Respiratory: No evidence of acute respiratory distress  Assessment & Plan  Primary Diagnosis & Pertinent Problem List: The primary encounter diagnosis was Chronic low back pain (Bilateral) (R>L) w/ sciatica (Right). Diagnoses of Chronic hip pain (Right), Lumbar facet joint pain, and Chronic pain syndrome were also pertinent to this visit. Visit Diagnosis: 1. Chronic low back pain (Bilateral) (R>L) w/ sciatica (Right)   2. Chronic hip pain (Right)   3. Lumbar facet joint pain   4. Chronic pain syndrome    Problems  updated and reviewed during this visit: No problems updated.  Plan of Care  Assessment and Plan             Pharmacotherapy (Medications Ordered): No orders of the defined types were placed in this encounter.  Procedure Orders    No procedure(s) ordered today   No orders of the defined types were placed in this encounter.  Lab Orders  No laboratory test(s) ordered today   Imaging Orders  No imaging studies ordered today   Referral Orders  No referral(s) requested today    Pharmacological management:  Opioid Analgesics: I will not be prescribing any opioids at this time Membrane stabilizer: I will not be prescribing any at this time Muscle relaxant: I will not be prescribing any at this time NSAID: I will not be prescribing any at this time Other analgesic(s): I will not be prescribing any at this time      Interventional Therapies  Risk Factors  Considerations  Medical Comorbidities:     Planned  Pending:      Under consideration:   Diagnostic right LESI #1    Completed: (Analgesic benefit)1  None at this time   Therapeutic  Palliative (PRN) options:   None  established   Completed by other providers:   The patient indicates having had several nerve blocks done on her back at a practice in [place].  She refers to it as the [facility], but does not remember the name of the provider.  1(Analgesic benefit): Expressed in percentage (%). (Local anesthetic[LA] +/- sedation  L.A.Local Anesthetic  Steroid benefit  Ongoing benefit)      Provider-requested follow-up: No follow-ups on file. Recent Visits Date Type Provider Dept  01/31/24 Office Visit Tanya Glisson, MD [facility]  Showing recent visits within past 90 days and meeting all other requirements Future Appointments Date Type Provider Dept  02/21/24 Appointment Tanya Glisson, MD [facility]  Showing future appointments within next 90 days and meeting all other  requirements   Primary Care Physician: Wendee Lynwood HERO, NP  Duration of encounter: *** minutes.  Total time on encounter, as per AMA guidelines included both the face-to-face and non-face-to-face time personally spent by the physician and/or other qualified health care professional(s) on the day of the encounter (includes time in activities that require the physician or other qualified health care professional and does not include time in activities normally performed by clinical staff). Physician's time may include the following activities when performed: Preparing to see the patient (e.g., pre-charting review of records, searching for previously ordered imaging, lab work, and nerve conduction tests) Review of prior analgesic pharmacotherapies. Reviewing PMP Interpreting ordered tests (e.g., lab work, imaging, nerve conduction tests) Performing post-procedure evaluations, including interpretation of diagnostic procedures Obtaining and/or reviewing separately obtained history Performing a medically appropriate examination and/or evaluation Counseling and educating the patient/family/caregiver Ordering medications, tests, or procedures Referring and communicating with other health care professionals (when not separately reported) Documenting clinical information in the electronic or other health record Independently interpreting results (not separately reported) and communicating results to the patient/ family/caregiver Care coordination (not separately reported)  Note by: Glisson DELENA Tanya, MD (TTS technology used. I apologize for any typographical errors that were not detected and corrected.) Date: 02/21/2024; Time: 6:52 PM

## 2024-02-21 ENCOUNTER — Ambulatory Visit: Attending: Pain Medicine | Admitting: Pain Medicine

## 2024-02-21 ENCOUNTER — Encounter: Payer: Self-pay | Admitting: Pain Medicine

## 2024-02-21 VITALS — BP 147/63 | HR 66 | Temp 97.9°F | Resp 16 | Ht 63.5 in | Wt 183.0 lb

## 2024-02-21 DIAGNOSIS — M79604 Pain in right leg: Secondary | ICD-10-CM | POA: Insufficient documentation

## 2024-02-21 DIAGNOSIS — E1140 Type 2 diabetes mellitus with diabetic neuropathy, unspecified: Secondary | ICD-10-CM | POA: Insufficient documentation

## 2024-02-21 DIAGNOSIS — M5459 Other low back pain: Secondary | ICD-10-CM | POA: Insufficient documentation

## 2024-02-21 DIAGNOSIS — M4186 Other forms of scoliosis, lumbar region: Secondary | ICD-10-CM | POA: Diagnosis not present

## 2024-02-21 DIAGNOSIS — M79671 Pain in right foot: Secondary | ICD-10-CM | POA: Diagnosis not present

## 2024-02-21 DIAGNOSIS — M47816 Spondylosis without myelopathy or radiculopathy, lumbar region: Secondary | ICD-10-CM | POA: Insufficient documentation

## 2024-02-21 DIAGNOSIS — M51369 Other intervertebral disc degeneration, lumbar region without mention of lumbar back pain or lower extremity pain: Secondary | ICD-10-CM | POA: Insufficient documentation

## 2024-02-21 DIAGNOSIS — E1142 Type 2 diabetes mellitus with diabetic polyneuropathy: Secondary | ICD-10-CM | POA: Insufficient documentation

## 2024-02-21 DIAGNOSIS — G8929 Other chronic pain: Secondary | ICD-10-CM | POA: Insufficient documentation

## 2024-02-21 DIAGNOSIS — M792 Neuralgia and neuritis, unspecified: Secondary | ICD-10-CM | POA: Insufficient documentation

## 2024-02-21 DIAGNOSIS — R202 Paresthesia of skin: Secondary | ICD-10-CM | POA: Insufficient documentation

## 2024-02-21 DIAGNOSIS — M150 Primary generalized (osteo)arthritis: Secondary | ICD-10-CM | POA: Diagnosis not present

## 2024-02-21 DIAGNOSIS — M4310 Spondylolisthesis, site unspecified: Secondary | ICD-10-CM | POA: Diagnosis not present

## 2024-02-21 DIAGNOSIS — R200 Anesthesia of skin: Secondary | ICD-10-CM | POA: Insufficient documentation

## 2024-02-21 DIAGNOSIS — M79672 Pain in left foot: Secondary | ICD-10-CM | POA: Insufficient documentation

## 2024-02-21 DIAGNOSIS — N281 Cyst of kidney, acquired: Secondary | ICD-10-CM | POA: Diagnosis not present

## 2024-02-21 DIAGNOSIS — M48061 Spinal stenosis, lumbar region without neurogenic claudication: Secondary | ICD-10-CM | POA: Insufficient documentation

## 2024-02-21 DIAGNOSIS — G894 Chronic pain syndrome: Secondary | ICD-10-CM | POA: Diagnosis not present

## 2024-02-21 DIAGNOSIS — M25551 Pain in right hip: Secondary | ICD-10-CM | POA: Diagnosis not present

## 2024-02-21 DIAGNOSIS — M5441 Lumbago with sciatica, right side: Secondary | ICD-10-CM | POA: Diagnosis not present

## 2024-02-21 DIAGNOSIS — R52 Pain, unspecified: Secondary | ICD-10-CM | POA: Diagnosis not present

## 2024-02-21 DIAGNOSIS — M4317 Spondylolisthesis, lumbosacral region: Secondary | ICD-10-CM | POA: Insufficient documentation

## 2024-02-21 DIAGNOSIS — R937 Abnormal findings on diagnostic imaging of other parts of musculoskeletal system: Secondary | ICD-10-CM | POA: Diagnosis not present

## 2024-02-21 NOTE — Patient Instructions (Signed)

## 2024-02-28 ENCOUNTER — Telehealth: Payer: Self-pay | Admitting: *Deleted

## 2024-02-29 ENCOUNTER — Ambulatory Visit
Admission: RE | Admit: 2024-02-29 | Discharge: 2024-02-29 | Disposition: A | Discharge status: Home / Self Care | Source: Ambulatory Visit | Attending: Pain Medicine | Admitting: Pain Medicine

## 2024-02-29 ENCOUNTER — Encounter: Payer: Self-pay | Admitting: Pain Medicine

## 2024-02-29 ENCOUNTER — Ambulatory Visit (HOSPITAL_BASED_OUTPATIENT_CLINIC_OR_DEPARTMENT_OTHER): Admitting: Pain Medicine

## 2024-02-29 VITALS — BP 149/71 | HR 65 | Temp 97.6°F | Resp 16 | Ht 63.0 in | Wt 182.0 lb

## 2024-02-29 DIAGNOSIS — R200 Anesthesia of skin: Secondary | ICD-10-CM

## 2024-02-29 DIAGNOSIS — M51361 Other intervertebral disc degeneration, lumbar region with lower extremity pain only: Secondary | ICD-10-CM | POA: Insufficient documentation

## 2024-02-29 DIAGNOSIS — G8929 Other chronic pain: Secondary | ICD-10-CM | POA: Diagnosis not present

## 2024-02-29 DIAGNOSIS — R937 Abnormal findings on diagnostic imaging of other parts of musculoskeletal system: Secondary | ICD-10-CM | POA: Insufficient documentation

## 2024-02-29 DIAGNOSIS — R52 Pain, unspecified: Secondary | ICD-10-CM | POA: Diagnosis not present

## 2024-02-29 DIAGNOSIS — M48061 Spinal stenosis, lumbar region without neurogenic claudication: Secondary | ICD-10-CM | POA: Diagnosis not present

## 2024-02-29 DIAGNOSIS — R202 Paresthesia of skin: Secondary | ICD-10-CM | POA: Diagnosis not present

## 2024-02-29 DIAGNOSIS — Z9889 Other specified postprocedural states: Secondary | ICD-10-CM

## 2024-02-29 DIAGNOSIS — M79604 Pain in right leg: Secondary | ICD-10-CM | POA: Diagnosis not present

## 2024-02-29 DIAGNOSIS — M5441 Lumbago with sciatica, right side: Secondary | ICD-10-CM | POA: Insufficient documentation

## 2024-02-29 DIAGNOSIS — M4317 Spondylolisthesis, lumbosacral region: Secondary | ICD-10-CM | POA: Insufficient documentation

## 2024-02-29 DIAGNOSIS — E1142 Type 2 diabetes mellitus with diabetic polyneuropathy: Secondary | ICD-10-CM | POA: Insufficient documentation

## 2024-02-29 MED ORDER — PENTAFLUOROPROP-TETRAFLUOROETH EX AERO
INHALATION_SPRAY | Freq: Once | CUTANEOUS | Status: AC
  Administered 2024-02-29: 30 via TOPICAL

## 2024-02-29 MED ORDER — ROPIVACAINE HCL 2 MG/ML IJ SOLN
2.0000 mL | Freq: Once | INTRAMUSCULAR | Status: AC
  Administered 2024-02-29: 2 mL via EPIDURAL
  Filled 2024-02-29: qty 20

## 2024-02-29 MED ORDER — TRIAMCINOLONE ACETONIDE 40 MG/ML IJ SUSP
40.0000 mg | Freq: Once | INTRAMUSCULAR | Status: AC
  Administered 2024-02-29: 40 mg
  Filled 2024-02-29: qty 1

## 2024-02-29 MED ORDER — LIDOCAINE HCL 2 % IJ SOLN
20.0000 mL | Freq: Once | INTRAMUSCULAR | Status: AC
  Administered 2024-02-29: 400 mg
  Filled 2024-02-29: qty 20

## 2024-02-29 MED ORDER — SODIUM CHLORIDE 0.9% FLUSH
2.0000 mL | Freq: Once | INTRAVENOUS | Status: AC
  Administered 2024-02-29: 2 mL

## 2024-02-29 MED ORDER — IOHEXOL 180 MG/ML  SOLN
10.0000 mL | Freq: Once | INTRAMUSCULAR | Status: AC
  Administered 2024-02-29: 10 mL via EPIDURAL
  Filled 2024-02-29: qty 20

## 2024-02-29 MED ORDER — MIDAZOLAM HCL 2 MG/2ML IJ SOLN: 0.5000 mg | Freq: Once | INTRAMUSCULAR | Status: DC

## 2024-02-29 NOTE — Patient Instructions (Signed)

## 2024-02-29 NOTE — Progress Notes (Signed)
 Safety precautions to be maintained throughout the outpatient stay will include: orient to surroundings, keep bed in low position, maintain call bell within reach at all times, provide assistance with transfer out of bed and ambulation.

## 2024-02-29 NOTE — Progress Notes (Signed)
 PROVIDER NOTE: Interpretation of information contained herein should be left to medically-trained personnel. Specific patient instructions are provided elsewhere under Patient Instructions section of medical record. This document was created in part using STT-dictation technology, any transcriptional errors that may result from this process are unintentional.  Patient: Brittney Pierce Type: Established DOB: 25-Dec-1950 MRN: [id number] PCP: Wendee Lynwood HERO, NP  Service: Procedure DOS: 02/29/2024 Setting: Ambulatory Location: Ambulatory outpatient facility Delivery: Face-to-face Provider: Eric DELENA Como, MD Specialty: Interventional Pain Management Specialty designation: 09 Location: Outpatient facility Ref. Prov.: Wendee Lynwood HERO, NP       Interventional Therapy   Type: Lumbar epidural steroid injection (LESI) (interlaminar) #1    Laterality: Right   Level:  L4-5 Level.  Imaging: Fluoroscopic guidance Spinal ([id number]) Anesthesia: Local anesthesia (1-2% Lidocaine ) Anxiolysis: None                 Sedation: No Sedation                       DOS: 02/29/2024  Performed by: Eric DELENA Como, MD  Purpose: Diagnostic/Therapeutic Indications: Lumbar radicular pain of intraspinal etiology of more than 4 weeks that has failed to respond to conservative therapy and is severe enough to impact quality of life or function. 1. Chronic low back pain (Bilateral) (R>L) w/ sciatica (Right)   2. Chronic lower extremity pain (Right)   3. Grade 1 (5 mm) Anterolisthesis of lumbosacral spine (L5/S1)   4. Lower extremity referred pain (Right)   5. Degeneration of intervertebral disc of lumbar region with lower extremity pain   6. Lumbar lateral recess stenosis (Left: L1-2, L2-3) (Right: L4-5, L5-S1)   7. Lumbar spinal stenosis, w/o neurogenic claudication (L2-3, L3-4)   8. Numbness and tingling of feet (Bilateral)   9. Diabetic peripheral neuropathy (HCC)   10. History of back surgery    11. Abnormal MRI, lumbar spine (01/14/2019)    NAS-11 Pain score:   Pre-procedure: 6 /10   Post-procedure: 4 /10      Position / Prep / Materials:  Position: Prone w/ head of the table raised (slight reverse trendelenburg) to facilitate breathing.  Prep solution: ChloraPrep (2% chlorhexidine gluconate and 70% isopropyl alcohol) Prep Area: Entire Posterior Lumbar Region from lower scapular tip down to mid buttocks area and from flank to flank. Materials:  Tray: Epidural tray Needle(s):  Type: Epidural needle (Tuohy) Gauge (G):  17 Length: Regular (3.5-in) Qty: 1  H&P (Pre-op Assessment):  Brittney Pierce is a 73 y.o. (year old), female patient, seen today for interventional treatment. She  has a past surgical history that includes Lumbar disc surgery (2008); Cesarean section; Cholecystectomy; and Cataract extraction, bilateral. Brittney Pierce has a current medication list which includes the following prescription(s): biotin, calcium , cetirizine, premarin , cranberry-vitamin c-vitamin e, d-mannose, diclofenac sodium, esomeprazole , fluticasone , hydrocortisone , hydroxyzine , lidocaine , meloxicam , mesalamine , metoprolol  tartrate, multiple vitamin, nitrofurantoin  (macrocrystal-monohydrate), pregabalin , probiotic product, rosuvastatin , and tramadol . Her primarily concern today is the Back Pain  Initial Vital Signs:  Pulse/HCG Rate: 65ECG Heart Rate: 71 Temp: 97.6 F (36.4 C) Resp: 18 BP: (!) 149/95 SpO2: 94 %  BMI: Estimated body mass index is 32.24 kg/m as calculated from the following:   Height as of this encounter: 5' 3 (1.6 m).   Weight as of this encounter: 182 lb (82.6 kg).  Risk Assessment: Allergies: Reviewed. She is allergic to amoxicillin , prednisone , bactrim  ds [sulfamethoxazole -trimethoprim ], codeine, erythromycin, fosamax  [alendronate ], and sulfamethizole.  Allergy Precautions: None required Coagulopathies: Reviewed. None identified.  Blood-thinner therapy: None at this  time Active Infection(s): Reviewed. None identified. Brittney Pierce is afebrile  Site Confirmation: Brittney Pierce was asked to confirm the procedure and laterality before marking the site Procedure checklist: Completed Consent: Before the procedure and under the influence of no sedative(s), amnesic(s), or anxiolytics, the patient was informed of the treatment options, risks and possible complications. To fulfill our ethical and legal obligations, as recommended by the American Medical Association's Code of Ethics, I have informed the patient of my clinical impression; the nature and purpose of the treatment or procedure; the risks, benefits, and possible complications of the intervention; the alternatives, including doing nothing; the risk(s) and benefit(s) of the alternative treatment(s) or procedure(s); and the risk(s) and benefit(s) of doing nothing. The patient was provided information about the general risks and possible complications associated with the procedure. These may include, but are not limited to: failure to achieve desired goals, infection, bleeding, organ or nerve damage, allergic reactions, paralysis, and death. In addition, the patient was informed of those risks and complications associated to Spine-related procedures, such as failure to decrease pain; infection (i.e.: Meningitis, epidural or intraspinal abscess); bleeding (i.e.: epidural hematoma, subarachnoid hemorrhage, or any other type of intraspinal or peri-dural bleeding); organ or nerve damage (i.e.: Any type of peripheral nerve, nerve root, or spinal cord injury) with subsequent damage to sensory, motor, and/or autonomic systems, resulting in permanent pain, numbness, and/or weakness of one or several areas of the body; allergic reactions; (i.e.: anaphylactic reaction); and/or death. Furthermore, the patient was informed of those risks and complications associated with the medications. These include, but are not limited to:  allergic reactions (i.e.: anaphylactic or anaphylactoid reaction(s)); adrenal axis suppression; blood sugar elevation that in diabetics may result in ketoacidosis or comma; water retention that in patients with history of congestive heart failure may result in shortness of breath, pulmonary edema, and decompensation with resultant heart failure; weight gain; swelling or edema; medication-induced neural toxicity; particulate matter embolism and blood vessel occlusion with resultant organ, and/or nervous system infarction; and/or aseptic necrosis of one or more joints. Finally, the patient was informed that Medicine is not an exact science; therefore, there is also the possibility of unforeseen or unpredictable risks and/or possible complications that may result in a catastrophic outcome. The patient indicated having understood very clearly. We have given the patient no guarantees and we have made no promises. Enough time was given to the patient to ask questions, all of which were answered to the patient's satisfaction. Brittney Pierce has indicated that she wanted to continue with the procedure. Attestation: I, the ordering provider, attest that I have discussed with the patient the benefits, risks, side-effects, alternatives, likelihood of achieving goals, and potential problems during recovery for the procedure that I have provided informed consent. Date  Time: 02/29/2024 12:48 PM  Pre-Procedure Preparation:  Monitoring: As per clinic protocol. Respiration, ETCO2, SpO2, BP, heart rate and rhythm monitor placed and checked for adequate function Safety Precautions: Patient was assessed for positional comfort and pressure points before starting the procedure. Time-out: I initiated and conducted the Time-out before starting the procedure, as per protocol. The patient was asked to participate by confirming the accuracy of the Time Out information. Verification of the correct person, site, and procedure were  performed and confirmed by me, the nursing staff, and the patient. Time-out conducted as per Joint Commission's Universal Protocol (UP.01.01.01). Time: 1309 Start Time: 1309 hrs.  Description/Narrative of Procedure:  Target: Epidural space via interlaminar opening, initially targeting the lower laminar border of the superior vertebral body. Region: Lumbar Approach: Percutaneous paravertebral  Rationale (medical necessity): procedure needed and proper for the diagnosis and/or treatment of the patient's medical symptoms and needs. Procedural Technique Safety Precautions: Aspiration looking for blood return was conducted prior to all injections. At no point did we inject any substances, as a needle was being advanced. No attempts were made at seeking any paresthesias. Safe injection practices and needle disposal techniques used. Medications properly checked for expiration dates. SDV (single dose vial) medications used. Description of the Procedure: Protocol guidelines were followed. The procedure needle was introduced through the skin, ipsilateral to the reported pain, and advanced to the target area. Bone was contacted and the needle walked caudad, until the lamina was cleared. The epidural space was identified using "loss-of-resistance technique" with 2-3 ml of PF-NaCl (0.9% NSS), in a 5cc LOR glass syringe.  Vitals:   02/29/24 1303 02/29/24 1312 02/29/24 1316 02/29/24 1322  BP: (!) 160/94 (!) 147/66 (!) 150/68 (!) 149/71  Pulse:      Resp: 16 19 18 16   Temp:      SpO2: 99% 100% 100% 100%  Weight:      Height:        Start Time: 1309 hrs. End Time: 1318 hrs.  Imaging Guidance (Spinal):          Type of Imaging Technique: Fluoroscopy Guidance (Spinal) Indication(s): Fluoroscopy guidance for needle placement to enhance accuracy in procedures requiring precise needle localization for targeted delivery of medication in or near specific anatomical locations not easily accessible  without such real-time imaging assistance. Exposure Time: Please see nurses notes. Contrast: Before injecting any contrast, we confirmed that the patient did not have an allergy to iodine, shellfish, or radiological contrast. Once satisfactory needle placement was completed at the desired level, radiological contrast was injected. Contrast injected under live fluoroscopy. No contrast complications. See chart for type and volume of contrast used. Fluoroscopic Guidance: I was personally present during the use of fluoroscopy. Tunnel Vision Technique used to obtain the best possible view of the target area. Parallax error corrected before commencing the procedure. Direction-depth-direction technique used to introduce the needle under continuous pulsed fluoroscopy. Once target was reached, antero-posterior, oblique, and lateral fluoroscopic projection used confirm needle placement in all planes. Images permanently stored in EMR. Interpretation: I personally interpreted the imaging intraoperatively. Adequate needle placement confirmed in multiple planes. Appropriate spread of contrast into desired area was observed. No evidence of afferent or efferent intravascular uptake. No intrathecal or subarachnoid spread observed. Permanent images saved into the patient's record.  Antibiotic Prophylaxis:   Anti-infectives (From admission, onward)    None      Indication(s): None identified  Post-operative Assessment:  Post-procedure Vital Signs:  Pulse/HCG Rate: 6568 Temp: 97.6 F (36.4 C) Resp: 16 BP: (!) 149/71 SpO2: 100 %  EBL: None  Complications: No immediate post-treatment complications observed by team, or reported by patient.  Note: The patient tolerated the entire procedure well. A repeat set of vitals were taken after the procedure and the patient was kept under observation following institutional policy, for this type of procedure. Post-procedural neurological assessment was performed,  showing return to baseline, prior to discharge. The patient was provided with post-procedure discharge instructions, including a section on how to identify potential problems. Should any problems arise concerning this procedure, the patient was given instructions to immediately contact us , at any time, without hesitation. In any case,  we plan to contact the patient by telephone for a follow-up status report regarding this interventional procedure.  Comments:  No additional relevant information.  Plan of Care (POC)  Orders:  Orders Placed This Encounter  Procedures   Lumbar Epidural Injection    Scheduling Instructions:     Procedure: Interlaminar LESI L4-5     Laterality: Right     Sedation: Patient's choice     Date: 02/29/2024    Where will this procedure be performed?:   [facility]   [facility] C-ARM 1-60 MIN NO REPORT    Intraoperative interpretation by procedural physician at [facility].    Standing Status:   Standing    Number of Occurrences:   1    Reason for exam::   Assistance in needle guidance and placement for procedures requiring needle placement in or near specific anatomical locations not easily accessible without such assistance.   Informed Consent Details: Physician/Practitioner Attestation; Transcribe to consent form and obtain patient signature    Note: Always confirm laterality of pain with Brittney Pierce, before procedure. Transcribe to consent form and obtain patient signature.    Physician/Practitioner attestation of informed consent for procedure/surgical case:   I, the physician/practitioner, attest that I have discussed with the patient the benefits, risks, side effects, alternatives, likelihood of achieving goals and potential problems during recovery for the procedure that I have provided informed consent.    Procedure:   Lumbar epidural steroid injection under fluoroscopic guidance    Physician/Practitioner performing the procedure:    Rhoderick Farrel A. Tanya, MD    Indication/Reason:   Low back and/or lower extremity pain secondary to lumbar radiculitis   Provide equipment / supplies at bedside    Procedural tray: Epidural Tray (Disposable  single use) Skin infiltration needle: Regular 1.5-in, 25-G, (x1) Block needle size: Regular standard Catheter: No catheter required    Standing Status:   Standing    Number of Occurrences:   1    Specify:   Epidural Tray     Opioid Analgesic: None MME/day: 0 mg/day    Medications ordered for procedure: Meds ordered this encounter  Medications   iohexol  (OMNIPAQUE ) 180 MG/ML injection 10 mL    Must be Myelogram-compatible. If not available, you may substitute with a water-soluble, non-ionic, hypoallergenic, myelogram-compatible radiological contrast medium.   lidocaine  (XYLOCAINE ) 2 % (with pres) injection 400 mg   pentafluoroprop-tetrafluoroeth (GEBAUERS) aerosol   DISCONTD: midazolam  (VERSED ) injection 0.5-2 mg    Make sure Flumazenil is available in the pyxis when using this medication. If oversedation occurs, administer 0.2 mg IV over 15 sec. If after 45 sec no response, administer 0.2 mg again over 1 min; may repeat at 1 min intervals; not to exceed 4 doses (1 mg)   sodium chloride  flush (NS) 0.9 % injection 2 mL   ropivacaine  (PF) 2 mg/mL (0.2%) (NAROPIN ) injection 2 mL   triamcinolone  acetonide (KENALOG -40) injection 40 mg   Medications administered: We administered iohexol , lidocaine , pentafluoroprop-tetrafluoroeth, sodium chloride  flush, ropivacaine  (PF) 2 mg/mL (0.2%), and triamcinolone  acetonide.  See the medical record for exact dosing, route, and time of administration.    Interventional Therapies  Risk Factors  Considerations  Medical Comorbidities:  ALLERGY: Prednisone , Codeine, Bactrim , Erythromycin, Amoxicillin   HTN  GERD  NIDDM  Osteoporosis     Planned  Pending:   Diagnostic/therapeutic right L4-5 LESI #1    Under consideration:    Diagnostic/therapeutic right L4-5 LESI #1  Diagnostic/therapeutic bilateral lumbar facet MBB #1  Completed: (Analgesic benefit)1  None at this time   Therapeutic  Palliative (PRN) options:   None established   Completed by other providers:   The patient indicates having had several nerve blocks done on her back at a practice in [place].  She refers to it as the spine center, but does not remember the name of the provider.  1(Analgesic benefit): Expressed in percentage (%). (Local anesthetic[LA] +/- sedation  L.A.Local Anesthetic  Steroid benefit  Ongoing benefit)      Follow-up plan:   Return in about 2 weeks (around 03/14/2024) for Eval-day (M,W), (Face2F), (PPE).     Recent Visits Date Type Provider Dept  02/21/24 Office Visit Tanya Glisson, MD [facility]  01/31/24 Office Visit Tanya Glisson, MD [facility]  Showing recent visits within past 90 days and meeting all other requirements Today's Visits Date Type Provider Dept  02/29/24 Procedure visit Tanya Glisson, MD [facility]  Showing today's visits and meeting all other requirements Future Appointments Date Type Provider Dept  03/13/24 Appointment Tanya Glisson, MD [facility]  Showing future appointments within next 90 days and meeting all other requirements   Disposition: Discharge home  Discharge (Date  Time): 02/29/2024; 1323 hrs.   Primary Care Physician: Wendee Lynwood HERO, NP Location: [facility] Note by: Glisson DELENA Tanya, MD (TTS technology used. I apologize for any typographical errors that were not detected and corrected.) Date: 02/29/2024; Time: 1:45 PM  Disclaimer:  Medicine is not an Visual merchandiser. The only guarantee in medicine is that nothing is guaranteed. It is important to note that the decision to proceed with this intervention was based on the information collected from the patient. The Data and  conclusions were drawn from the patient's questionnaire, the interview, and the physical examination. Because the information was provided in large part by the patient, it cannot be guaranteed that it has not been purposely or unconsciously manipulated. Every effort has been made to obtain as much relevant data as possible for this evaluation. It is important to note that the conclusions that lead to this procedure are derived in large part from the available data. Always take into account that the treatment will also be dependent on availability of resources and existing treatment guidelines, considered by other Pain Management Practitioners as being common knowledge and practice, at the time of the intervention. For Medico-Legal purposes, it is also important to point out that variation in procedural techniques and pharmacological choices are the acceptable norm. The indications, contraindications, technique, and results of the above procedure should only be interpreted and judged by a Board-Certified Interventional Pain Specialist with extensive familiarity and expertise in the same exact procedure and technique.

## 2024-03-01 ENCOUNTER — Telehealth: Payer: Self-pay

## 2024-03-01 NOTE — Telephone Encounter (Signed)
 Post procedure follow up.  LM

## 2024-03-08 NOTE — Progress Notes (Signed)
 03/09/2024 1:44 PM   Brittney Pierce October 16, 1950 [id number]  Referring provider: Wendee Lynwood HERO, NP [address] [place],  [place] [address]  Urological history: 1. rUTI's -Contributing factors of age, GSM, ulcerative colitis and diabetes -January 05, 2024, E. coli -August 25, 2023, E.coli  -July 19, 2023, E. coli and mixed urogenital flora -July 05, 2023, E.coli -June 17, 2023, E. coli -June 08, 2023, E. Coli -Vaginal estrogen cream, cranberry tablets and d-mannose    Chief Complaint  Patient presents with   Dysuria   HPI: Brittney Pierce is a 73 y.o. female who presents today for two month follow up to ensure that her microscopic hematuria on urinalysis has cleared after treatment of her UTI.  Previous records reviewed.   She states she has 2 weeks left of the Macrobid  100 mg daily.   She does not feel symptoms of UTI today.  Patient denies any modifying or aggravating factors.  Patient denies any recent UTI's, gross hematuria, dysuria or suprapubic/flank pain.  Patient denies any fevers, chills, nausea or vomiting.    UA bland  She is planning the Premarin  cream twice weekly.  Hemoglobin A1c (01/2024) 6.2  PMH: Past Medical History:  Diagnosis Date   DDD (degenerative disc disease), lumbar    Disorder of bone and cartilage, unspecified    Dysuria    Esophageal reflux    Lumbago    Osteoarthrosis, unspecified whether generalized or localized, unspecified site    Scoliosis    Ulcerative colitis, unspecified    Unspecified essential hypertension     Surgical History: Past Surgical History:  Procedure Laterality Date   CATARACT EXTRACTION, BILATERAL     08/2019 and 09/2019   CESAREAN SECTION     x2   CHOLECYSTECTOMY     LUMBAR DISC SURGERY  2008    Home Medications:  Allergies as of 03/09/2024       Reactions   Amoxicillin  Diarrhea   Prednisone  Other (See Comments)   Sx's of heart attack - elevated BP, HR and  chest tightness/pressure.   Bactrim  Ds [sulfamethoxazole -trimethoprim ] Other (See Comments)   Rash,itching,whelps, throat swelling and feels like lump in throat.   Codeine    REACTION: stomach upset   Erythromycin    REACTION: stomach upset   Fosamax  [alendronate ] Other (See Comments)   Throat felt raw.   Sulfamethizole Hives        Medication List        Accurate as of March 09, 2024  1:44 PM. If you have any questions, ask your nurse or doctor.          BIOTIN PO Take 1 capsule by mouth daily.   Calcium  1200-1000 MG-UNIT Chew Chew 1 tablet by mouth daily.   cetirizine 10 MG tablet Commonly known as: ZYRTEC Take 10 mg by mouth daily.   Cranberry-Vitamin C-Vitamin E 4200-20-3 MG-MG-UNIT Caps Take by mouth. 1 daily   D-MANNOSE PO Take 1,000 mg by mouth daily.   diclofenac sodium 1 % Gel Commonly known as: VOLTAREN Apply topically as needed.   esomeprazole  40 MG capsule Commonly known as: NexIUM  TAKE ONE CAPSULE BY MOUTH EVERY DAY - TAKE AT LEAST 1 HOUR BEFORE A MEAL   fluticasone  50 MCG/ACT nasal spray Commonly known as: FLONASE  2 sprays each nostril as directed.   hydrocortisone  2.5 % cream Apply 1 Application topically 2 (two) times daily.   hydrOXYzine  10 MG tablet Commonly known as: ATARAX  Take 1 tablet (10 mg total)  by mouth 3 (three) times daily as needed.   lidocaine  5 % Commonly known as: Lidoderm  Place 2 patches onto the skin daily. Remove & Discard patch within 12 hours or as directed by MD   meloxicam  15 MG tablet Commonly known as: MOBIC  Take 1 tablet (15 mg total) by mouth daily. with food   mesalamine  1000 MG suppository Commonly known as: CANASA  PLACE 1 SUPPOSITORY (1,000 MG TOTAL) RECTALLY AT BEDTIME AS NEEDED.   metoprolol  tartrate 25 MG tablet Commonly known as: LOPRESSOR  Take 1 tablet (25 mg total) by mouth 2 (two) times daily.   MULTIVITAMIN PO Take 1 tablet by mouth daily.   nitrofurantoin  (macrocrystal-monohydrate)  100 MG capsule Commonly known as: Macrobid  Take 1 capsule (100 mg total) by mouth at bedtime.   pregabalin  75 MG capsule Commonly known as: LYRICA  Take 1 capsule (75 mg total) by mouth at bedtime.   Premarin  vaginal cream Generic drug: conjugated estrogens  Use a pea sized amount on the urethra 2 times per week.   PROBIOTIC DAILY PO Take by mouth.   rosuvastatin  5 MG tablet Commonly known as: Crestor  Take 1 tablet (5 mg total) by mouth daily.   traMADol  50 MG tablet Commonly known as: ULTRAM  Take 1 tablet (50 mg total) by mouth 2 (two) times daily as needed.        Allergies:  Allergies  Allergen Reactions   Amoxicillin  Diarrhea   Prednisone  Other (See Comments)    Sx's of heart attack - elevated BP, HR and chest tightness/pressure.   Bactrim  Ds [Sulfamethoxazole -Trimethoprim ] Other (See Comments)    Rash,itching,whelps, throat swelling and feels like lump in throat.   Codeine     REACTION: stomach upset   Erythromycin     REACTION: stomach upset   Fosamax  [Alendronate ] Other (See Comments)    Throat felt raw.   Sulfamethizole Hives    Family History: Family History  Problem Relation Age of Onset   Heart attack Father 7   Diabetes Father    Lung cancer Mother     Social History:  reports that she quit smoking about 43 years ago. Her smoking use included cigarettes. She started smoking about 56 years ago. She has a 13 pack-year smoking history. She has never used smokeless tobacco. She reports that she does not currently use alcohol. She reports that she does not use drugs.  ROS: Pertinent ROS in HPI  Physical Exam: BP (!) 160/81 (BP Location: Left Arm, Patient Position: Sitting, Cuff Size: Normal)   Pulse 80   Ht 5' 3.5 (1.613 m)   Wt 173 lb 14.4 oz (78.9 kg)   BMI 30.32 kg/m   Constitutional:  Well nourished. Alert and oriented, No acute distress. HEENT: [place] AT, moist mucus membranes.  Trachea midline Cardiovascular: No clubbing, cyanosis, or  edema. Respiratory: Normal respiratory effort, no increased work of breathing. Neurologic: Grossly intact, no focal deficits, moving all 4 extremities. Psychiatric: Normal mood and affect.    Laboratory Data: See EPIC and HPI  I have reviewed the labs.   Pertinent Imaging: N/A   Assessment & Plan:    1. rUTI's - Go ahead and order renal ultrasound to rule out any nidus for her recurrent UTIs - She will continue applying the Premarin  cream twice weekly  2.  Vaginal atrophy - Continue vaginal estrogen cream 2 nights weekly  3. Microscopic hematuria - UA w/o micro heme  Return for I will call patient with results.  These notes generated with voice recognition software. I apologize for typographical errors.  CLOTILDA HELON RIGGERS  [facility] [address]  [address] [place], [place] [address] [phone]

## 2024-03-09 ENCOUNTER — Ambulatory Visit (INDEPENDENT_AMBULATORY_CARE_PROVIDER_SITE_OTHER): Admitting: Urology

## 2024-03-09 ENCOUNTER — Encounter: Payer: Self-pay | Admitting: Urology

## 2024-03-09 VITALS — BP 160/81 | HR 80 | Ht 63.5 in | Wt 173.9 lb

## 2024-03-09 DIAGNOSIS — N390 Urinary tract infection, site not specified: Secondary | ICD-10-CM | POA: Diagnosis not present

## 2024-03-09 DIAGNOSIS — R3129 Other microscopic hematuria: Secondary | ICD-10-CM | POA: Diagnosis not present

## 2024-03-09 DIAGNOSIS — N952 Postmenopausal atrophic vaginitis: Secondary | ICD-10-CM | POA: Diagnosis not present

## 2024-03-09 LAB — MICROSCOPIC EXAMINATION

## 2024-03-09 LAB — URINALYSIS, COMPLETE
Bilirubin, UA: NEGATIVE
Glucose, UA: NEGATIVE
Ketones, UA: NEGATIVE
Leukocytes,UA: NEGATIVE
Nitrite, UA: NEGATIVE
Protein,UA: NEGATIVE
RBC, UA: NEGATIVE
Specific Gravity, UA: 1.025 (ref 1.005–1.030)
Urobilinogen, Ur: 0.2 mg/dL (ref 0.2–1.0)
pH, UA: 6 (ref 5.0–7.5)

## 2024-03-09 NOTE — Patient Instructions (Signed)
 [phone].

## 2024-03-12 NOTE — Progress Notes (Unsigned)
 PROVIDER NOTE: Interpretation of information contained herein should be left to medically-trained personnel. Specific patient instructions are provided elsewhere under Patient Instructions section of medical record. This document was created in part using AI and STT-dictation technology, any transcriptional errors that may result from this process are unintentional.  Patient: Brittney Pierce  Service: E/M   PCP: Wendee Lynwood HERO, NP  DOB: [birth year]  DOS: 03/13/2024  Provider: Eric DELENA Como, MD  MRN: [id number]  Delivery: Face-to-face  Specialty: Interventional Pain Management  Type: Established Patient  Setting: Ambulatory outpatient facility  Specialty designation: 09  Referring Prov.: Wendee Lynwood HERO, NP  Location: Outpatient office facility       History of present illness (HPI) Brittney Pierce, a 73 y.o. year old female, is here today because of her Chronic bilateral low back pain with right-sided sciatica [G89.29, M54.41]. Brittney Pierce primary complain today is No chief complaint on file.  Pertinent problems: Brittney Pierce has Chronic low back pain (Bilateral) (R>L) w/ sciatica (Right); Acute on chronic back pain; Chronic pain syndrome; Chronic hip pain (Right); History of back surgery; Lumbar facet joint pain; Chronic low back pain (Bilateral) (R>L) w/o sciatica; Dextroscoliosis of lumbar spine; Lumbar DDD; Lumbar facet hypertrophy; Chronic lower extremity pain (Right); Osteoarthritis involving multiple joints; Lower extremity referred pain (Right); Abnormal MRI, lumbar spine (01/14/2019); Chronic painful diabetic neuropathy (HCC); Diabetic peripheral neuropathy (HCC); Chronic feet pain (Bilateral); Numbness and tingling of feet (Bilateral); Burning pain; Chronic peripheral neuropathic pain; Grade 1 (5 mm) Anterolisthesis of lumbosacral spine (L5/S1); Grade 1 Retrolisthesis of T12/L1 & L1/L2; Renal cyst (Right); Lumbar foraminal stenosis (Left: L1-2); Lumbar lateral  recess stenosis (Left: L1-2, L2-3) (Right: L4-5, L5-S1); Lumbar spinal stenosis, w/o neurogenic claudication (L2-3, L3-4); and Lumbar facet arthropathy (Multilevel) (Bilateral) on their pertinent problem list.  Pain Assessment: Severity of   is reported as a  /10. Location:    / . Onset:  . Quality:  . Timing:  . Modifying factor(s):  SABRA Vitals:  vitals were not taken for this visit.  BMI: Estimated body mass index is 30.32 kg/m as calculated from the following:   Height as of 03/09/24: 5' 3.5 (1.613 m).   Weight as of 03/09/24: 173 lb 14.4 oz (78.9 kg).  Last encounter: 02/21/2024. Last procedure: 02/29/2024.  Reason for encounter: post-procedure evaluation and assessment.   Discussed the use of AI scribe software for clinical note transcription with the patient, who gave verbal consent to proceed.  History of Present Illness          Post-Procedure Evaluation   Type: Lumbar epidural steroid injection (LESI) (interlaminar) #1    Laterality: Right   Level:  L4-5 Level.  Imaging: Fluoroscopic guidance Spinal ([id number]) Anesthesia: Local anesthesia (1-2% Lidocaine ) Anxiolysis: None                 Sedation: No Sedation                       DOS: 02/29/2024  Performed by: Eric DELENA Como, MD  Purpose: Diagnostic/Therapeutic Indications: Lumbar radicular pain of intraspinal etiology of more than 4 weeks that has failed to respond to conservative therapy and is severe enough to impact quality of life or function. 1. Chronic low back pain (Bilateral) (R>L) w/ sciatica (Right)   2. Chronic lower extremity pain (Right)   3. Grade 1 (5 mm) Anterolisthesis of lumbosacral spine (L5/S1)   4. Lower extremity referred pain (Right)   5. Degeneration of  intervertebral disc of lumbar region with lower extremity pain   6. Lumbar lateral recess stenosis (Left: L1-2, L2-3) (Right: L4-5, L5-S1)   7. Lumbar spinal stenosis, w/o neurogenic claudication (L2-3, L3-4)   8. Numbness and tingling of feet  (Bilateral)   9. Diabetic peripheral neuropathy (HCC)   10. History of back surgery   11. Abnormal MRI, lumbar spine (01/14/2019)    NAS-11 Pain score:   Pre-procedure: 6 /10   Post-procedure: 4 /10     Effectiveness:  Initial hour after procedure:   ***. Subsequent 4-6 hours post-procedure:   ***. Analgesia past initial 6 hours:   ***. Ongoing improvement:  Analgesic:  *** Function:    ***    ROM:    ***     Pharmacotherapy Assessment   Opioid Analgesic: None MME/day: 0 mg/day   Monitoring: [place] PMP: PDMP reviewed during this encounter.       Pharmacotherapy: No side-effects or adverse reactions reported. Compliance: No problems identified. Effectiveness: Clinically acceptable.  No notes on file  UDS:  Summary  Date Value Ref Range Status  01/31/2024 FINAL  Corrected    Comment:    ==================================================================== Compliance Drug Analysis, Ur ==================================================================== Test                             Result       Flag       Units  Drug Present and Declared for Prescription Verification   Tramadol                        >6098        EXPECTED   ng/mg creat   O-Desmethyltramadol            2837         EXPECTED   ng/mg creat   N-Desmethyltramadol            2487         EXPECTED   ng/mg creat    Source of tramadol  is a prescription medication. O-desmethyltramadol    and N-desmethyltramadol are expected metabolites of tramadol .    Pregabalin                      PRESENT      EXPECTED   Lidocaine                       PRESENT      EXPECTED   Metoprolol                      PRESENT      EXPECTED  Drug Present not Declared for Prescription Verification   Acetaminophen                  PRESENT      UNEXPECTED   Diphenhydramine                 PRESENT      UNEXPECTED  Drug Absent but Declared for Prescription Verification   Diclofenac                     Not Detected UNEXPECTED    Diclofenac, as  indicated in the declared medication list, is not    always detected even when used as directed.    Hydroxyzine   Not Detected UNEXPECTED ==================================================================== Test                      Result    Flag   Units      Ref Range   Creatinine              82               mg/dL      >=79 ==================================================================== Declared Medications:  The flagging and interpretation on this report are based on the  following declared medications.  Unexpected results may arise from  inaccuracies in the declared medications.   **Note: The testing scope of this panel includes these medications:   Hydroxyzine  (Atarax )  Metoprolol  (Lopressor )  Pregabalin  (Lyrica )  Tramadol  (Ultram )   **Note: The testing scope of this panel does not include small to  moderate amounts of these reported medications:   Diclofenac (Voltaren)  Topical Lidocaine  (Lidoderm )   **Note: The testing scope of this panel does not include the  following reported medications:   Biotin  Calcium   Cetirizine (Zyrtec)  Cranberry  Esomeprazole  (Nexium )  Estrogens  (Premarin )  Fluticasone  (Flonase )  Hydrocortisone   Meloxicam  (Mobic )  Mesalamine   Multivitamin  Nitrofurantoin  (Macrobid )  Probiotic  Rosuvastatin  (Crestor )  Supplement  Vitamin C  Vitamin E ==================================================================== For clinical consultation, please call [phone]. ====================================================================     No results found for: CBDTHCR No results found for: D8THCCBX No results found for: D9THCCBX  ROS  Constitutional: Denies any fever or chills Gastrointestinal: No reported hemesis, hematochezia, vomiting, or acute GI distress Musculoskeletal: Denies any acute onset joint swelling, redness, loss of ROM, or weakness Neurological: No reported episodes of acute onset apraxia,  aphasia, dysarthria, agnosia, amnesia, paralysis, loss of coordination, or loss of consciousness  Medication Review  Biotin, Calcium , Cranberry-Vitamin C-Vitamin E, D-Mannose, Multiple Vitamin, Probiotic Product, cetirizine, conjugated estrogens , diclofenac sodium, esomeprazole , fluticasone , hydrOXYzine , hydrocortisone , lidocaine , meloxicam , mesalamine , metoprolol  tartrate, nitrofurantoin  (macrocrystal-monohydrate), pregabalin , rosuvastatin , and traMADol   History Review  Allergy: Brittney Pierce is allergic to amoxicillin , prednisone , bactrim  ds [sulfamethoxazole -trimethoprim ], codeine, erythromycin, fosamax  [alendronate ], and sulfamethizole. Drug: Brittney Pierce  reports no history of drug use. Alcohol:  reports that she does not currently use alcohol. Tobacco:  reports that she quit smoking about 43 years ago. Her smoking use included cigarettes. She started smoking about 56 years ago. She has a 13 pack-year smoking history. She has never used smokeless tobacco. Social: Brittney Pierce  reports that she quit smoking about 43 years ago. Her smoking use included cigarettes. She started smoking about 56 years ago. She has a 13 pack-year smoking history. She has never used smokeless tobacco. She reports that she does not currently use alcohol. She reports that she does not use drugs. Medical:  has a past medical history of DDD (degenerative disc disease), lumbar, Disorder of bone and cartilage, unspecified, Dysuria, Esophageal reflux, Lumbago, Osteoarthrosis, unspecified whether generalized or localized, unspecified site, Scoliosis, Ulcerative colitis, unspecified, and Unspecified essential hypertension. Surgical: Brittney Pierce  has a past surgical history that includes Lumbar disc surgery (2008); Cesarean section; Cholecystectomy; and Cataract extraction, bilateral. Family: family history includes Diabetes in her father; Heart attack (age of onset: 26) in her father; Lung cancer in her  mother.  Laboratory Chemistry Profile   Renal Lab Results  Component Value Date   BUN 14 01/31/2024   CREATININE 1.02 (H) 01/31/2024   BCR 14 01/31/2024   GFR 50.57 (L) 08/18/2023   GFRAA >60 03/22/2019   GFRNONAA >60  03/22/2019    Hepatic Lab Results  Component Value Date   AST 27 01/31/2024   ALT 15 12/01/2022   ALBUMIN 4.6 01/31/2024   ALKPHOS 64 01/31/2024    Electrolytes Lab Results  Component Value Date   NA 135 01/31/2024   K 4.9 01/31/2024   CL 100 01/31/2024   CALCIUM  9.9 01/31/2024   MG 1.9 01/31/2024    Bone Lab Results  Component Value Date   VD25OH 70.39 08/18/2023    Inflammation (CRP: Acute Phase) (ESR: Chronic Phase) Lab Results  Component Value Date   CRP <1 01/31/2024   ESRSEDRATE 15 01/31/2024         Note: Above Lab results reviewed.  Recent Imaging Review  DG PAIN CLINIC C-ARM 1-60 MIN NO REPORT Fluoro was used, but no Radiologist interpretation will be provided.  Please refer to NOTES tab for provider progress note. Note: Reviewed        Physical Exam  Vitals: There were no vitals taken for this visit. BMI: Estimated body mass index is 30.32 kg/m as calculated from the following:   Height as of 03/09/24: 5' 3.5 (1.613 m).   Weight as of 03/09/24: 173 lb 14.4 oz (78.9 kg). Ideal: Ideal body weight: 53.5 kg (118 lb 0.9 oz) Adjusted ideal body weight: 63.7 kg (140 lb 6.3 oz) General appearance: Well nourished, well developed, and well hydrated. In no apparent acute distress Mental status: Alert, oriented x 3 (person, place, & time)       Respiratory: No evidence of acute respiratory distress Eyes: PERLA   Assessment   Diagnosis Status  1. Chronic low back pain (Bilateral) (R>L) w/ sciatica (Right)   2. Chronic lower extremity pain (Right)   3. Grade 1 Retrolisthesis of T12/L1 & L1/L2   4. Grade 1 (5 mm) Anterolisthesis of lumbosacral spine (L5/S1)   5. History of back surgery   6. Lower extremity referred pain (Right)   7.  Lumbar facet joint pain   8. Lumbar foraminal stenosis (Left: L1-2)   9. Lumbar lateral recess stenosis (Left: L1-2, L2-3) (Right: L4-5, L5-S1)   10. Lumbar spinal stenosis, w/o neurogenic claudication (L2-3, L3-4)   11. Postop check    Controlled Controlled Controlled   Updated Problems: No problems updated.  Plan of Care  Problem-specific:  Assessment and Plan            Brittney Pierce has a current medication list which includes the following long-term medication(s): calcium , cetirizine, esomeprazole , fluticasone , mesalamine , metoprolol  tartrate, pregabalin , and rosuvastatin .  Pharmacotherapy (Medications Ordered): No orders of the defined types were placed in this encounter.  Orders:  No orders of the defined types were placed in this encounter.    Interventional Therapies  Risk Factors  Considerations  Medical Comorbidities:  ALLERGY: Prednisone , Codeine, Bactrim , Erythromycin, Amoxicillin   HTN  GERD  NIDDM  Osteoporosis     Planned  Pending:   Diagnostic/therapeutic right L4-5 LESI #1    Under consideration:   Diagnostic/therapeutic right L4-5 LESI #1  Diagnostic/therapeutic bilateral lumbar facet MBB #1    Completed: (Analgesic benefit)1  None at this time   Therapeutic  Palliative (PRN) options:   None established   Completed by other providers:   The patient indicates having had several nerve blocks done on her back at a practice in [place].  She refers to it as the spine center, but does not remember the name of the provider.  1(Analgesic benefit): Expressed in percentage (%). (Local anesthetic[LA] +/-  sedation  L.A.Local Anesthetic  Steroid benefit  Ongoing benefit)     No follow-ups on file.    Recent Visits Date Type Provider Dept  02/29/24 Procedure visit Tanya Glisson, MD [facility]  02/21/24 Office Visit Tanya Glisson, MD [facility]  01/31/24 Office Visit Tanya Glisson, MD  [facility]  Showing recent visits within past 90 days and meeting all other requirements Future Appointments Date Type Provider Dept  03/13/24 Appointment Tanya Glisson, MD [facility]  Showing future appointments within next 90 days and meeting all other requirements  I discussed the assessment and treatment plan with the patient. The patient was provided an opportunity to ask questions and all were answered. The patient agreed with the plan and demonstrated an understanding of the instructions.  Patient advised to call back or seek an in-person evaluation if the symptoms or condition worsens.  Duration of encounter: *** minutes.  Total time on encounter, as per AMA guidelines included both the face-to-face and non-face-to-face time personally spent by the physician and/or other qualified health care professional(s) on the day of the encounter (includes time in activities that require the physician or other qualified health care professional and does not include time in activities normally performed by clinical staff). Physician's time may include the following activities when performed: Preparing to see the patient (e.g., pre-charting review of records, searching for previously ordered imaging, lab work, and nerve conduction tests) Review of prior analgesic pharmacotherapies. Reviewing PMP Interpreting ordered tests (e.g., lab work, imaging, nerve conduction tests) Performing post-procedure evaluations, including interpretation of diagnostic procedures Obtaining and/or reviewing separately obtained history Performing a medically appropriate examination and/or evaluation Counseling and educating the patient/family/caregiver Ordering medications, tests, or procedures Referring and communicating with other health care professionals (when not separately reported) Documenting clinical information in the electronic or other health record Independently interpreting  results (not separately reported) and communicating results to the patient/ family/caregiver Care coordination (not separately reported)  Note by: Glisson DELENA Tanya, MD (TTS and AI technology used. I apologize for any typographical errors that were not detected and corrected.) Date: 03/13/2024; Time: 7:28 PM

## 2024-03-13 ENCOUNTER — Ambulatory Visit: Attending: Pain Medicine | Admitting: Pain Medicine

## 2024-03-13 ENCOUNTER — Encounter: Payer: Self-pay | Admitting: Pain Medicine

## 2024-03-13 VITALS — BP 136/72 | HR 73 | Temp 97.7°F | Resp 16 | Ht 63.5 in | Wt 173.0 lb

## 2024-03-13 DIAGNOSIS — M5459 Other low back pain: Secondary | ICD-10-CM | POA: Insufficient documentation

## 2024-03-13 DIAGNOSIS — M4317 Spondylolisthesis, lumbosacral region: Secondary | ICD-10-CM | POA: Insufficient documentation

## 2024-03-13 DIAGNOSIS — M4310 Spondylolisthesis, site unspecified: Secondary | ICD-10-CM | POA: Diagnosis not present

## 2024-03-13 DIAGNOSIS — M48061 Spinal stenosis, lumbar region without neurogenic claudication: Secondary | ICD-10-CM | POA: Diagnosis not present

## 2024-03-13 DIAGNOSIS — R937 Abnormal findings on diagnostic imaging of other parts of musculoskeletal system: Secondary | ICD-10-CM | POA: Diagnosis not present

## 2024-03-13 DIAGNOSIS — Z9889 Other specified postprocedural states: Secondary | ICD-10-CM | POA: Diagnosis not present

## 2024-03-13 DIAGNOSIS — R52 Pain, unspecified: Secondary | ICD-10-CM | POA: Insufficient documentation

## 2024-03-13 DIAGNOSIS — Z09 Encounter for follow-up examination after completed treatment for conditions other than malignant neoplasm: Secondary | ICD-10-CM | POA: Insufficient documentation

## 2024-03-13 DIAGNOSIS — M5441 Lumbago with sciatica, right side: Secondary | ICD-10-CM | POA: Diagnosis not present

## 2024-03-13 DIAGNOSIS — G8929 Other chronic pain: Secondary | ICD-10-CM | POA: Diagnosis not present

## 2024-03-13 DIAGNOSIS — M79604 Pain in right leg: Secondary | ICD-10-CM | POA: Insufficient documentation

## 2024-03-13 NOTE — Progress Notes (Signed)
 Safety precautions to be maintained throughout the outpatient stay will include: orient to surroundings, keep bed in low position, maintain call bell within reach at all times, provide assistance with transfer out of bed and ambulation.

## 2024-03-15 DIAGNOSIS — L578 Other skin changes due to chronic exposure to nonionizing radiation: Secondary | ICD-10-CM | POA: Diagnosis not present

## 2024-03-15 DIAGNOSIS — Z85828 Personal history of other malignant neoplasm of skin: Secondary | ICD-10-CM | POA: Diagnosis not present

## 2024-03-15 DIAGNOSIS — B372 Candidiasis of skin and nail: Secondary | ICD-10-CM | POA: Diagnosis not present

## 2024-03-15 DIAGNOSIS — L570 Actinic keratosis: Secondary | ICD-10-CM | POA: Diagnosis not present

## 2024-03-15 DIAGNOSIS — Z872 Personal history of diseases of the skin and subcutaneous tissue: Secondary | ICD-10-CM | POA: Diagnosis not present

## 2024-03-28 ENCOUNTER — Ambulatory Visit
Admission: RE | Admit: 2024-03-28 | Discharge: 2024-03-28 | Disposition: A | Discharge status: Home / Self Care | Source: Ambulatory Visit | Attending: Urology | Admitting: Urology

## 2024-03-28 DIAGNOSIS — N390 Urinary tract infection, site not specified: Secondary | ICD-10-CM | POA: Diagnosis not present

## 2024-03-28 DIAGNOSIS — N281 Cyst of kidney, acquired: Secondary | ICD-10-CM | POA: Diagnosis not present

## 2024-03-30 DIAGNOSIS — H9313 Tinnitus, bilateral: Secondary | ICD-10-CM | POA: Diagnosis not present

## 2024-03-30 DIAGNOSIS — H903 Sensorineural hearing loss, bilateral: Secondary | ICD-10-CM | POA: Diagnosis not present

## 2024-04-06 ENCOUNTER — Ambulatory Visit: Payer: Self-pay | Admitting: Urology

## 2024-04-10 DIAGNOSIS — E119 Type 2 diabetes mellitus without complications: Secondary | ICD-10-CM | POA: Diagnosis not present

## 2024-04-10 DIAGNOSIS — H52223 Regular astigmatism, bilateral: Secondary | ICD-10-CM | POA: Diagnosis not present

## 2024-04-10 DIAGNOSIS — Z9841 Cataract extraction status, right eye: Secondary | ICD-10-CM | POA: Diagnosis not present

## 2024-04-10 DIAGNOSIS — Z9842 Cataract extraction status, left eye: Secondary | ICD-10-CM | POA: Diagnosis not present

## 2024-04-10 LAB — HM DIABETES EYE EXAM

## 2024-04-23 DIAGNOSIS — R300 Dysuria: Secondary | ICD-10-CM | POA: Diagnosis not present

## 2024-04-23 DIAGNOSIS — N390 Urinary tract infection, site not specified: Secondary | ICD-10-CM | POA: Diagnosis not present

## 2024-04-23 DIAGNOSIS — N3000 Acute cystitis without hematuria: Secondary | ICD-10-CM | POA: Diagnosis not present

## 2024-04-25 ENCOUNTER — Ambulatory Visit: Payer: Medicare Other

## 2024-04-25 VITALS — Ht 63.5 in | Wt 173.0 lb

## 2024-04-25 DIAGNOSIS — Z0000 Encounter for general adult medical examination without abnormal findings: Secondary | ICD-10-CM | POA: Diagnosis not present

## 2024-04-25 NOTE — Patient Instructions (Signed)
 Brittney Pierce,  Thank you for taking the time for your Medicare Wellness Visit. I appreciate your continued commitment to your health goals. Please review the care plan we discussed, and feel free to reach out if I can assist you further.  Medicare recommends these wellness visits once per year to help you and your care team stay ahead of potential health issues. These visits are designed to focus on prevention, allowing your provider to concentrate on managing your acute and chronic conditions during your regular appointments.  Please note that Annual Wellness Visits do not include a physical exam. Some assessments may be limited, especially if the visit was conducted virtually. If needed, we may recommend a separate in-person follow-up with your provider.  Ongoing Care Seeing your primary care provider every 3 to 6 months helps us  monitor your health and provide consistent, personalized care.    Referrals If a referral was made during today's visit and you haven't received any updates within two weeks, please contact the referred provider directly to check on the status.  Recommended Screenings:  Health Maintenance  Topic Date Due   Flu Shot  02/25/2024   COVID-19 Vaccine (10 - Pfizer risk 2024-25 season) 03/27/2024   Medicare Annual Wellness Visit  04/19/2024   Hemoglobin A1C  08/02/2024   Yearly kidney function blood test for diabetes  01/30/2025   Yearly kidney health urinalysis for diabetes  02/15/2025   Complete foot exam   02/15/2025   Stool Blood Test  02/15/2025   Eye exam for diabetics  04/10/2025   DTaP/Tdap/Td vaccine (2 - Td or Tdap) 02/20/2030   Pneumococcal Vaccine for age over 58  Completed   DEXA scan (bone density measurement)  Completed   Hepatitis C Screening  Completed   Zoster (Shingles) Vaccine  Completed   HPV Vaccine  Aged Out   Meningitis B Vaccine  Aged Out   Breast Cancer Screening  Discontinued   Colon Cancer Screening  Discontinued        04/25/2024    1:59 PM  Advanced Directives  Does Patient Have a Medical Advance Directive? Yes  Type of Estate agent of [place];Living will  Copy of Healthcare Power of Attorney in Chart? No - copy requested   Advance Care Planning is important because it: Ensures you receive medical care that aligns with your values, goals, and preferences. Provides guidance to your family and loved ones, reducing the emotional burden of decision-making during critical moments.  Vision: Annual vision screenings are recommended for early detection of glaucoma, cataracts, and diabetic retinopathy. These exams can also reveal signs of chronic conditions such as diabetes and high blood pressure.  Dental: Annual dental screenings help detect early signs of oral cancer, gum disease, and other conditions linked to overall health, including heart disease and diabetes.  Please see the attached documents for additional preventive care recommendations.

## 2024-04-25 NOTE — Progress Notes (Signed)
 Please attest and cosign this visit due to patients primary care provider not being in the office at the time the visit was completed.    Subjective:   Brittney Pierce is a 73 y.o. who presents for a Medicare Wellness preventive visit.  As a reminder, Annual Wellness Visits don't include a physical exam, and some assessments may be limited, especially if this visit is performed virtually. We may recommend an in-person follow-up visit with your provider if needed.  Visit Complete: Virtual I connected with  Brittney Pierce on 04/25/24 by a audio enabled telemedicine application and verified that I am speaking with the correct person using two identifiers.  Patient Location: Home  Provider Location: Office/Clinic  I discussed the limitations of evaluation and management by telemedicine. The patient expressed understanding and agreed to proceed.  Vital Signs: Because this visit was a virtual/telehealth visit, some criteria may be missing or patient reported. Any vitals not documented were not able to be obtained and vitals that have been documented are patient reported.  VideoDeclined- This patient declined Librarian, academic. Therefore the visit was completed with audio only.  Persons Participating in Visit: Patient.  AWV Questionnaire: Yes: Patient Medicare AWV questionnaire was completed by the patient on 04/21/24; I have confirmed that all information answered by patient is correct and no changes since this date.  Cardiac Risk Factors include: advanced age (>31men, >17 women);diabetes mellitus;dyslipidemia;hypertension;obesity (BMI >30kg/m2)     Objective:    Today's Vitals   04/25/24 1351  Weight: 173 lb (78.5 kg)  Height: 5' 3.5 (1.613 m)   Body mass index is 30.16 kg/m.     04/25/2024    1:59 PM 03/13/2024   11:52 AM 02/29/2024   12:50 PM 02/21/2024   10:58 AM 01/31/2024    2:13 PM 04/20/2023    2:27 PM 02/09/2022   11:06 AM   Advanced Directives  Does Patient Have a Medical Advance Directive? Yes Yes Yes Yes Yes Yes No  Type of Estate agent of [place];Living will  Living will;Healthcare Power of Attorney Living will;Healthcare Power of Attorney Living will;Healthcare Power of [employer] Power of [place];Living will   Copy of Healthcare Power of Attorney in Chart? No - copy requested     No - copy requested   Would patient like information on creating a medical advance directive?       No - Patient declined    Current Medications (verified) Outpatient Encounter Medications as of 04/25/2024  Medication Sig   BIOTIN PO Take 1 capsule by mouth daily.    Calcium  1200-1000 MG-UNIT CHEW Chew 1 tablet by mouth daily.   cetirizine (ZYRTEC) 10 MG tablet Take 10 mg by mouth daily.   conjugated estrogens  (PREMARIN ) vaginal cream Use a pea sized amount on the urethra 2 times per week.   Cranberry-Vitamin C-Vitamin E 4200-20-3 MG-MG-UNIT CAPS Take by mouth. 1 daily   D-MANNOSE PO Take 1,000 mg by mouth daily.   diclofenac sodium (VOLTAREN) 1 % GEL Apply topically as needed.    esomeprazole  (NEXIUM ) 40 MG capsule TAKE ONE CAPSULE BY MOUTH EVERY DAY - TAKE AT LEAST 1 HOUR BEFORE A MEAL   fluticasone  (FLONASE ) 50 MCG/ACT nasal spray 2 sprays each nostril as directed.   hydrocortisone  2.5 % cream Apply 1 Application topically 2 (two) times daily.   hydrOXYzine  (ATARAX ) 10 MG tablet Take 1 tablet (10 mg total) by mouth 3 (three) times daily as needed.   lidocaine  (LIDODERM )  5 % Place 2 patches onto the skin daily. Remove & Discard patch within 12 hours or as directed by MD   meloxicam  (MOBIC ) 15 MG tablet Take 1 tablet (15 mg total) by mouth daily. with food   mesalamine  (CANASA ) 1000 MG suppository PLACE 1 SUPPOSITORY (1,000 MG TOTAL) RECTALLY AT BEDTIME AS NEEDED.   metoprolol  tartrate (LOPRESSOR ) 25 MG tablet Take 1 tablet (25 mg total) by mouth 2 (two) times daily.   Multiple Vitamins-Minerals  (MULTIVITAMIN PO) Take 1 tablet by mouth daily.   pregabalin  (LYRICA ) 75 MG capsule Take 1 capsule (75 mg total) by mouth at bedtime.   Probiotic Product (PROBIOTIC DAILY PO) Take by mouth.   rosuvastatin  (CRESTOR ) 5 MG tablet Take 1 tablet (5 mg total) by mouth daily.   traMADol  (ULTRAM ) 50 MG tablet Take 1 tablet (50 mg total) by mouth 2 (two) times daily as needed.   nitrofurantoin , macrocrystal-monohydrate, (MACROBID ) 100 MG capsule Take 1 capsule (100 mg total) by mouth at bedtime.   No facility-administered encounter medications on file as of 04/25/2024.    Allergies (verified) Amoxicillin , Prednisone , Bactrim  ds [sulfamethoxazole -trimethoprim ], Codeine, Erythromycin, Fosamax  [alendronate ], and Sulfamethizole   History: Past Medical History:  Diagnosis Date   DDD (degenerative disc disease), lumbar    Disorder of bone and cartilage, unspecified    Dysuria    Esophageal reflux    Lumbago    Osteoarthrosis, unspecified whether generalized or localized, unspecified site    Scoliosis    Ulcerative colitis, unspecified    Unspecified essential hypertension    Past Surgical History:  Procedure Laterality Date   CATARACT EXTRACTION, BILATERAL     08/2019 and 09/2019   CESAREAN SECTION     x2   CHOLECYSTECTOMY     LUMBAR DISC SURGERY  2008   Family History  Problem Relation Age of Onset   Heart attack Father 38   Diabetes Father    Lung cancer Mother    Social History   Socioeconomic History   Marital status: Widowed    Spouse name: Not on file   Number of children: Not on file   Years of education: Not on file   Highest education level: Associate degree: academic program  Occupational History   Occupation: Retired  Tobacco Use   Smoking status: Former    Current packs/day: 0.00    Average packs/day: 1 pack/day for 13.0 years (13.0 ttl pk-yrs)    Types: Cigarettes    Start date: 07/28/1967    Quit date: 07/27/1980    Years since quitting: 43.7   Smokeless tobacco:  Never  Vaping Use   Vaping status: Never Used  Substance and Sexual Activity   Alcohol use: Not Currently    Comment: Once a month (maybe)   Drug use: No   Sexual activity: Not Currently  Other Topics Concern   Not on file  Social History Narrative   Regular exercise-yes      Moved here from [facility] to be closer to grandchildrenLikes to garden   Does have a living will-but will need to update   Clotilda Mowers (daughter) or sons, are health care POA   .Would desire life support, no prolonged life support if futile.   Social Drivers of Corporate investment banker Strain: Low Risk  (04/25/2024)   Overall Financial Resource Strain (CARDIA)    Difficulty of Paying Living Expenses: Not very hard  Food Insecurity: No Food Insecurity (04/25/2024)   Hunger Vital Sign    Worried About  Running Out of Food in the Last Year: Never true    Ran Out of Food in the Last Year: Never true  Transportation Needs: No Transportation Needs (04/25/2024)   PRAPARE - Administrator, Civil Service (Medical): No    Lack of Transportation (Non-Medical): No  Physical Activity: Insufficiently Active (04/25/2024)   Exercise Vital Sign    Days of Exercise per Week: 2 days    Minutes of Exercise per Session: 10 min  Stress: No Stress Concern Present (04/25/2024)   [employer] of Occupational Health - Occupational Stress Questionnaire    Feeling of Stress: Only a little  Social Connections: Moderately Integrated (04/25/2024)   Social Connection and Isolation Panel    Frequency of Communication with Friends and Family: Twice a week    Frequency of Social Gatherings with Friends and Family: Once a week    Attends Religious Services: More than 4 times per year    Active Member of [employer] or Organizations: Yes    Attends Banker Meetings: More than 4 times per year    Marital Status: Widowed    Tobacco Counseling Counseling given: Not Answered   Clinical Intake:  Pre-visit  preparation completed: Yes  Pain : No/denies pain    BMI - recorded: 30.16 Nutritional Status: BMI > 30  Obese Nutritional Risks: None Diabetes: Yes CBG done?: No Did pt. bring in CBG monitor from home?: No  Lab Results  Component Value Date   HGBA1C 6.2 (H) 01/31/2024   HGBA1C 6.0 (A) 08/18/2023   HGBA1C 6.3 (A) 07/08/2022     How often do you need to have someone help you when you read instructions, pamphlets, or other written materials from your doctor or pharmacy?: 1 - Never  Interpreter Needed?: No  Information entered by :: B.Esbeydi Manago,LPN   Activities of Daily Living     04/21/2024   11:00 AM  In your present state of health, do you have any difficulty performing the following activities:  Hearing? 0  Vision? 0  Difficulty concentrating or making decisions? 0  Walking or climbing stairs? 0  Dressing or bathing? 0  Doing errands, shopping? 0  Preparing Food and eating ? N  Using the Toilet? N  In the past six months, have you accidently leaked urine? Y  Do you have problems with loss of bowel control? N  Managing your Medications? N  Managing your Finances? N  Housekeeping or managing your Housekeeping? N    Patient Care Team: Wendee Lynwood HERO, NP as PCP - General (Nurse Practitioner) Darliss Rogue, MD as PCP - Cardiology (Cardiology) Earle, Karyn, PA-C (Inactive) as Consulting Physician (Gastroenterology) Cathlyn Seal, MD as Consulting Physician (Dermatology) Lorenza Dagoberto PENNER., OD as Consulting Physician (Optometry) Portia Fireman, OD (Optometry)  I have updated your Care Teams any recent Medical Services you may have received from other providers in the past year.     Assessment:   This is a routine wellness examination for [place].  Hearing/Vision screen Hearing Screening - Comments:: Patient says she has some hearing difficulties; ENT yesterday and will give hearing aids Vision Screening - Comments:: Pt says their vision is good without  glasses;just readers only Dr  Portia   Goals Addressed               This Visit's Progress     COMPLETED: DIET - EAT MORE FRUITS AND VEGETABLES        COMPLETED: Eat more protein (pt-stated)  COMPLETED: Increase physical activity        Starting 05/10/2018, I will continue to exercise for at least 60 minutes 3 days per week.       Patient Stated   On track     04/25/24- I will maintain and continue medications as prescribed.       Patient Stated        New goals        Depression Screen     04/25/2024    1:56 PM 03/13/2024   11:52 AM 02/29/2024   12:50 PM 02/21/2024   10:58 AM 02/16/2024   11:12 AM 01/31/2024    2:13 PM 12/16/2023   11:55 AM  PHQ 2/9 Scores  PHQ - 2 Score 0 0 0 0 0 0 0  PHQ- 9 Score     2  3    Fall Risk     04/21/2024   11:00 AM 03/13/2024   11:52 AM 02/29/2024   12:50 PM 02/21/2024   10:58 AM 02/16/2024   11:12 AM  Fall Risk   Falls in the past year? 0 0 0 0 0  Number falls in past yr: 0   0 0  Injury with Fall? 0   0 0  Risk for fall due to : No Fall Risks    No Fall Risks  Follow up Falls prevention discussed;Education provided    Falls evaluation completed    MEDICARE RISK AT HOME:  Medicare Risk at Home Any stairs in or around the home?: (Patient-Rptd) Yes If so, are there any without handrails?: (Patient-Rptd) No Home free of loose throw rugs in walkways, pet beds, electrical cords, etc?: (Patient-Rptd) Yes Adequate lighting in your home to reduce risk of falls?: (Patient-Rptd) Yes Life alert?: (Patient-Rptd) No Use of a cane, walker or w/c?: (Patient-Rptd) Yes Grab bars in the bathroom?: (Patient-Rptd) Yes Shower chair or bench in shower?: (Patient-Rptd) Yes Elevated toilet seat or a handicapped toilet?: (Patient-Rptd) Yes  TIMED UP AND GO:  Was the test performed?  No  Cognitive Function: 6CIT completed    05/29/2020   11:16 AM 05/10/2018   10:43 AM 05/04/2017   10:10 AM  MMSE - Mini Mental State Exam  Orientation to time  5 5 5    Orientation to Place 5 5 5    Registration 3 3 3    Attention/ Calculation 5 0 0   Recall 3 3 3    Language- name 2 objects  0 0   Language- repeat 1 1 1   Language- follow 3 step command  3 3   Language- read & follow direction  0 0   Write a sentence  0 0   Copy design  0 0   Total score  20 20      Data saved with a previous flowsheet row definition        04/25/2024    2:00 PM 04/20/2023    2:28 PM 02/09/2022   11:07 AM  6CIT Screen  What Year? 0 points 0 points 0 points  What month? 0 points 0 points 0 points  What time? 0 points 0 points 0 points  Count back from 20 0 points 0 points 0 points  Months in reverse 0 points 0 points 0 points  Repeat phrase 0 points 0 points 0 points  Total Score 0 points 0 points 0 points    Immunizations Immunization History  Administered Date(s) Administered    sv, Bivalent, Protein Subunit Rsvpref,pf (Abrysvo) 05/08/2022  Fluad Quad(high Dose 65+) 06/01/2019, 04/24/2020, 04/13/2023   INFLUENZA, HIGH DOSE SEASONAL PF 04/30/2017, 04/08/2018   Influenza Split 05/12/2011   Influenza Whole 04/29/2010   Influenza,inj,Quad PF,6+ Mos 04/25/2013, 05/07/2014, 03/20/2015   Moderna Covid-19 Fall Seasonal Vaccine 67yrs & older 04/15/2022   PFIZER Comirnaty(Gray Top)Covid-19 Tri-Sucrose Vaccine 11/12/2020, 05/04/2023   PFIZER(Purple Top)SARS-COV-2 Vaccination 09/17/2019, 10/10/2019, 05/01/2020, 11/12/2020   PNEUMOCOCCAL CONJUGATE-20 12/08/2021   Pfizer Covid-19 Vaccine Bivalent Booster 15yrs & up 02/23/2021, 07/12/2021   Pfizer(Comirnaty)Fall Seasonal Vaccine 12 years and older 05/04/2023   Pneumococcal Conjugate-13 03/20/2015   Pneumococcal Polysaccharide-23 02/13/2014   RSV,unspecified 04/26/2022   Tdap 02/21/2020   Zoster Recombinant(Shingrix) 06/05/2020, 08/22/2020   Zoster, Live 04/07/2011    Screening Tests Health Maintenance  Topic Date Due   Influenza Vaccine  02/25/2024   COVID-19 Vaccine (10 - Pfizer risk 2024-25 season)  03/27/2024   Medicare Annual Wellness (AWV)  04/19/2024   HEMOGLOBIN A1C  08/02/2024   Diabetic kidney evaluation - eGFR measurement  01/30/2025   Diabetic kidney evaluation - Urine ACR  02/15/2025   FOOT EXAM  02/15/2025   COLON CANCER SCREENING ANNUAL FOBT  02/15/2025   OPHTHALMOLOGY EXAM  04/10/2025   DTaP/Tdap/Td (2 - Td or Tdap) 02/20/2030   Pneumococcal Vaccine: 50+ Years  Completed   DEXA SCAN  Completed   Hepatitis C Screening  Completed   Zoster Vaccines- Shingrix  Completed   HPV VACCINES  Aged Out   Meningococcal B Vaccine  Aged Out   Mammogram  Discontinued   Colonoscopy  Discontinued    Health Maintenance Items Addressed: None needed at this time:pt indicates she had Influenza and [facility]iines [facility]  Additional Screening:  Vision Screening: Recommended annual ophthalmology exams for early detection of glaucoma and other disorders of the eye. Is the patient up to date with their annual eye exam?  Yes  Who is the provider or what is the name of the office in which the patient attends annual eye exams? Dr Portia  Dental Screening: Recommended annual dental exams for proper oral hygiene  Community Resource Referral / Chronic Care Management: CRR required this visit?  No   CCM required this visit?  No   Plan:    I have personally reviewed and noted the following in the patient's chart:   Medical and social history Use of alcohol, tobacco or illicit drugs  Current medications and supplements including opioid prescriptions. Patient is currently taking opioid prescriptions. Information provided to patient regarding non-opioid alternatives. Patient advised to discuss non-opioid treatment plan with their provider. Functional ability and status Nutritional status Physical activity Advanced directives List of other physicians Hospitalizations, surgeries, and ER visits in previous 12 months Vitals Screenings to include cognitive,  depression, and falls Referrals and appointments  In addition, I have reviewed and discussed with patient certain preventive protocols, quality metrics, and best practice recommendations. A written personalized care plan for preventive services as well as general preventive health recommendations were provided to patient.   Erminio LITTIE Saris, LPN   [id number]   After Visit Summary: (MyChart) Due to this being a telephonic visit, the after visit summary with patients personalized plan was offered to patient via MyChart   Notes: Nothing significant to report at this time.

## 2024-05-04 ENCOUNTER — Ambulatory Visit: Admitting: Urology

## 2024-05-04 ENCOUNTER — Ambulatory Visit: Payer: Self-pay

## 2024-05-04 VITALS — BP 143/83 | HR 65 | Wt 175.0 lb

## 2024-05-04 DIAGNOSIS — R300 Dysuria: Secondary | ICD-10-CM | POA: Diagnosis not present

## 2024-05-04 DIAGNOSIS — R350 Frequency of micturition: Secondary | ICD-10-CM | POA: Diagnosis not present

## 2024-05-04 DIAGNOSIS — N952 Postmenopausal atrophic vaginitis: Secondary | ICD-10-CM

## 2024-05-04 LAB — URINALYSIS, COMPLETE
Bilirubin, UA: NEGATIVE
Glucose, UA: NEGATIVE
Ketones, UA: NEGATIVE
Nitrite, UA: POSITIVE — AB
Protein,UA: NEGATIVE
RBC, UA: NEGATIVE
Specific Gravity, UA: 1.01 (ref 1.005–1.030)
Urobilinogen, Ur: 1 mg/dL (ref 0.2–1.0)
pH, UA: 6 (ref 5.0–7.5)

## 2024-05-04 LAB — MICROSCOPIC EXAMINATION

## 2024-05-04 MED ORDER — PREMARIN 0.625 MG/GM VA CREA: TOPICAL_CREAM | VAGINAL | 12 refills | Status: DC

## 2024-05-04 NOTE — Telephone Encounter (Signed)
 Noted

## 2024-05-04 NOTE — Progress Notes (Signed)
 05/04/2024 5:37 PM   Heron Sheriff Sult 06/04/1951 [id number]  Referring provider: Wendee Lynwood HERO, NP [address] [place],  [place] [address]  Urological history: 1. rUTI's - RUS (03/2024) simple right renal cyst  - April 23, 2024 - E.coli -January 05, 2024, E. coli -August 25, 2023, E.coli  -July 19, 2023, E. coli and mixed urogenital flora -July 05, 2023, E.coli -June 17, 2023, E. coli -June 08, 2023, E. Coli -Vaginal estrogen cream, cranberry tablets and d-mannose   Chief Complaint  Patient presents with   Urinary Frequency   Follow-up   HPI: Alayziah Tangeman is a 73 y.o. woman who presents today for urinary frequency, burning and pressure.   Previous records reviewed.  She was seen on 09/28 at [facility] for symptoms of mild fevers, increase in urgency and pain with urination.  She was given Cipro  500 mg x 7 days.  Her culture was positive for E.coli and sensitive to Cipro .  She felt her symptoms abated while she was on the antibiotic, but they returned as soon as she finished the Cipro .    We had last seen her in August and she was just finishing up 90 days of nitrofurantoin  for rUTI's.  She was having no symptoms at that visit.   Her UA was bland.    She is currently experiencing perineal burning that is continual and suprapubic pressure.  Patient denies any modifying or aggravating factors.  Patient denies any gross hematuria and suprapubic/flank pain.  Patient denies any fevers, chills, nausea or vomiting.    UA orange clear, specific gravity 1.010, pH 6.0, 1+ leuks, nitrite positive, 11-30 WBC's, 0-2 RBC's, mucus present, and moderate bacteria.  Recent PVR's have demonstrated adequate bladder emptying   Serum creatinine (01/2024) 1.02, eGFR 57  Hemoglobin A1c (01/2024) 6.2  PMH: Past Medical History:  Diagnosis Date   DDD (degenerative disc disease), lumbar    Disorder of bone and cartilage, unspecified     Dysuria    Esophageal reflux    Lumbago    Osteoarthrosis, unspecified whether generalized or localized, unspecified site    Scoliosis    Ulcerative colitis, unspecified    Unspecified essential hypertension     Surgical History: Past Surgical History:  Procedure Laterality Date   CATARACT EXTRACTION, BILATERAL     08/2019 and 09/2019   CESAREAN SECTION     x2   CHOLECYSTECTOMY     LUMBAR DISC SURGERY  2008    Home Medications:  Allergies as of 05/04/2024       Reactions   Amoxicillin  Diarrhea   Prednisone  Other (See Comments)   Sx's of heart attack - elevated BP, HR and chest tightness/pressure.   Bactrim  Ds [sulfamethoxazole -trimethoprim ] Other (See Comments)   Rash,itching,whelps, throat swelling and feels like lump in throat.   Codeine    REACTION: stomach upset   Erythromycin    REACTION: stomach upset   Fosamax  [alendronate ] Other (See Comments)   Throat felt raw.   Sulfamethizole Hives        Medication List        Accurate as of May 04, 2024 11:59 PM. If you have any questions, ask your nurse or doctor.          STOP taking these medications    nitrofurantoin  (macrocrystal-monohydrate) 100 MG capsule Commonly known as: Macrobid  Stopped by: Smera Guyette       TAKE these medications    BIOTIN PO Take 1 capsule  by mouth daily.   Calcium  1200-1000 MG-UNIT Chew Chew 1 tablet by mouth daily.   cetirizine 10 MG tablet Commonly known as: ZYRTEC Take 10 mg by mouth daily.   Cranberry-Vitamin C-Vitamin E 4200-20-3 MG-MG-UNIT Caps Take by mouth. 1 daily   D-MANNOSE PO Take 1,000 mg by mouth daily.   diclofenac sodium 1 % Gel Commonly known as: VOLTAREN Apply topically as needed.   esomeprazole  40 MG capsule Commonly known as: NexIUM  TAKE ONE CAPSULE BY MOUTH EVERY DAY - TAKE AT LEAST 1 HOUR BEFORE A MEAL   fluticasone  50 MCG/ACT nasal spray Commonly known as: FLONASE  2 sprays each nostril as directed.   hydrocortisone  2.5 %  cream Apply 1 Application topically 2 (two) times daily.   hydrOXYzine  10 MG tablet Commonly known as: ATARAX  Take 1 tablet (10 mg total) by mouth 3 (three) times daily as needed.   lidocaine  5 % Commonly known as: Lidoderm  Place 2 patches onto the skin daily. Remove & Discard patch within 12 hours or as directed by MD   meloxicam  15 MG tablet Commonly known as: MOBIC  Take 1 tablet (15 mg total) by mouth daily. with food   mesalamine  1000 MG suppository Commonly known as: CANASA  PLACE 1 SUPPOSITORY (1,000 MG TOTAL) RECTALLY AT BEDTIME AS NEEDED.   metoprolol  tartrate 25 MG tablet Commonly known as: LOPRESSOR  Take 1 tablet (25 mg total) by mouth 2 (two) times daily.   MULTIVITAMIN PO Take 1 tablet by mouth daily.   pregabalin  75 MG capsule Commonly known as: LYRICA  Take 1 capsule (75 mg total) by mouth at bedtime.   Premarin  vaginal cream Generic drug: conjugated estrogens  Use a pea sized amount on the urethra nightly for 30 days, then on Monday, Wednesday and Friday nights What changed: additional instructions Changed by: Johnn Krasowski   PROBIOTIC DAILY PO Take by mouth.   rosuvastatin  5 MG tablet Commonly known as: Crestor  Take 1 tablet (5 mg total) by mouth daily.   traMADol  50 MG tablet Commonly known as: ULTRAM  Take 1 tablet (50 mg total) by mouth 2 (two) times daily as needed.        Allergies:  Allergies  Allergen Reactions   Amoxicillin  Diarrhea   Prednisone  Other (See Comments)    Sx's of heart attack - elevated BP, HR and chest tightness/pressure.   Bactrim  Ds [Sulfamethoxazole -Trimethoprim ] Other (See Comments)    Rash,itching,whelps, throat swelling and feels like lump in throat.   Codeine     REACTION: stomach upset   Erythromycin     REACTION: stomach upset   Fosamax  [Alendronate ] Other (See Comments)    Throat felt raw.   Sulfamethizole Hives    Family History: Family History  Problem Relation Age of Onset   Heart attack Father 75    Diabetes Father    Lung cancer Mother     Social History:  reports that she quit smoking about 43 years ago. Her smoking use included cigarettes. She started smoking about 56 years ago. She has a 13 pack-year smoking history. She has never used smokeless tobacco. She reports that she does not currently use alcohol. She reports that she does not use drugs.  ROS: Pertinent ROS in HPI  Physical Exam: BP (!) 143/83 (BP Location: Left Arm, Patient Position: Sitting, Cuff Size: Large)   Pulse 65   Wt 175 lb (79.4 kg)   SpO2 96%   BMI 30.51 kg/m   Constitutional:  Well nourished. Alert and oriented, No acute distress. HEENT: [place] AT,  moist mucus membranes.  Trachea midline Cardiovascular: No clubbing, cyanosis, or edema. Respiratory: Normal respiratory effort, no increased work of breathing. GU: No CVA tenderness.  No bladder fullness or masses.  Recession of labia minora, dry, pale vulvar vaginal mucosa and loss of mucosal ridges and folds. Labial erythema noted.   Small urethral caruncle noted.  No urethral masses, tenderness and/or tenderness. No bladder fullness, tenderness or masses. Pale vagina mucosa, fair estrogen effect, no discharge, no lesions, fair pelvic support, grade I cystocele and no rectocele noted.  Anus and perineum are without rashes or lesions.    Neurologic: Grossly intact, no focal deficits, moving all 4 extremities. Psychiatric: Normal mood and affect.    Laboratory Data: See Epic and HPI I have reviewed the labs.   Pertinent Imaging: N/A  Assessment & Plan:    1. Urinary frequency (Primary) - may be secondary to GSM - will reassess when she follow ups in one month   2. Burning with urination - we discussed my concern that her symptoms may be the result of GSM vs rUTIs and I am worried that she may develop a resistant bacteria after her exposure to numerous antibiotics  - she is applying the vaginal estrogen cream three time weekly, so I would like her to  increase the application to every 30 days and then resume applying Monday, Wednesday and Friday  - Urinalysis, Complete, question of colonization - CULTURE, URINE COMPREHENSIVE, did not prescribe an antibiotic at this time, when the culture result is available will discuss what symptoms she is having at that time and if it is appropriate, we will prescribe an antibiotic - recommend Vagisil for vaginal irritation as well  - follow up in one month   Return in about 1 month (around 06/04/2024) for repeat exam and symptom recheck .  These notes generated with voice recognition software. I apologize for typographical errors.  CLOTILDA HELON RIGGERS  [facility] [address]  [address] [place], [place] [address] [phone]

## 2024-05-04 NOTE — Telephone Encounter (Signed)
 noted

## 2024-05-04 NOTE — Telephone Encounter (Signed)
 No available appointments at PCP office today Patient's urologist called, this call disconnected, called patient back to check on what urology advised her and patient states that urology was able to get her in today at 11am to be seen for her symptoms.  FYI Only or Action Required?: FYI only for provider.  Patient was last seen in primary care on 02/16/2024 by Wendee Lynwood HERO, NP.  Called Nurse Triage reporting No chief complaint on file..  Symptoms began about 3 days ago.  Interventions attempted: Rest, hydration, or home remedies and Other: patient states she took Uristat over the counter.  Symptoms are: gradually worsening.  Triage Disposition: See Physician Within 24 Hours  Patient/caregiver understands and will follow disposition?: Yes       Copied from CRM #[id number]. Topic: Clinical - Red Word Triage >> May 04, 2024  9:22 AM Mia F wrote: Red Word that prompted transfer to Nurse Triage: Possible uti. Burning, frequency, urgency. Has been going on for several days. Gets them often. Reason for Disposition  Urinating more frequently than usual (i.e., frequency) OR new-onset of the feeling of an urgent need to urinate (i.e., urgency)  Answer Assessment - Initial Assessment Questions Patient has a urologist but they cannot see her today 3 month round of antibiotics finished from urology finished around September 12th Sept 28th--had urinary symptoms--went to Urgent Care 7 days of a different antibiotic (Cipro ) Finished that and a few days later started having symptoms again (about 3 days ago) Patient states that her urologist called while we were speaking. This RN advised her to go ahead and answer that call.  Patient is advised to call us  back if anything changes or with any further questions/concerns. Patient is advised that if anything worsens to go to the Emergency Room. Patient verbalized understanding.       1. SYMPTOM: What's the main symptom you're concerned  about? (e.g., frequency, incontinence)     Burning, frequency 2. ONSET: When did the  urinary symptoms  start?     About 3 days ago 3. PAIN: Is there any pain? If Yes, ask: How bad is it? (Scale: 1-10; mild, moderate, severe)     burning 4. CAUSE: What do you think is causing the symptoms?     UTI 5. OTHER SYMPTOMS: Do you have any other symptoms? (e.g., blood in urine, fever, flank pain, pain with urination)     Burning with urination  Protocols used: Urinary Symptoms-A-AH

## 2024-05-04 NOTE — Patient Instructions (Addendum)
   For the estrogen cream, apply every night for 30 days and then continue on a Monday, Wednesday and Friday nights

## 2024-05-05 ENCOUNTER — Encounter: Payer: Self-pay | Admitting: Nurse Practitioner

## 2024-05-05 DIAGNOSIS — M545 Low back pain, unspecified: Secondary | ICD-10-CM

## 2024-05-07 ENCOUNTER — Encounter: Payer: Self-pay | Admitting: Urology

## 2024-05-08 MED ORDER — TRAMADOL HCL 50 MG PO TABS
50.0000 mg | ORAL_TABLET | Freq: Two times a day (BID) | ORAL | 0 refills | Status: DC | PRN
Start: 2024-05-08 — End: 2024-06-13

## 2024-05-08 NOTE — Addendum Note (Signed)
 Addended by: WENDEE LYNWOOD HERO on: 05/08/2024 01:44 PM   Modules accepted: Orders

## 2024-05-08 NOTE — Telephone Encounter (Signed)
 Last given 02/16/24 #[id number]

## 2024-05-10 LAB — CULTURE, URINE COMPREHENSIVE

## 2024-05-11 ENCOUNTER — Ambulatory Visit: Payer: Self-pay | Admitting: Urology

## 2024-06-06 ENCOUNTER — Ambulatory Visit: Admitting: Urology

## 2024-06-06 NOTE — Progress Notes (Unsigned)
 06/08/2024 12:44 PM   Brittney Pierce 05-Sep-1950 [id number]  Referring provider: Wendee Lynwood HERO, NP [address] [place],  [place] [address]  Urological history: 1. rUTI's - RUS (03/2024) simple right renal cyst  - May 04, 2024 - E.coli - April 23, 2024 - E.coli -January 05, 2024, E. coli -August 25, 2023, E.coli  -July 19, 2023, E. coli and mixed urogenital flora -July 05, 2023, E.coli -June 17, 2023, E. coli -June 08, 2023, E. Coli -Vaginal estrogen cream, cranberry tablets and d-mannose   No chief complaint on file.  HPI: Brittney Pierce is a 73 y.o. woman who presents today for 1 month follow-up after increasing her vaginal estrogen cream nightly for 30 days   Previous records reviewed.  They are having (1 to 7) or (8 or more) daytime voids,  they are having nocturia (1-2) or (3 or more) and urgency is (none, mild, strong, severe).   They are having (stress, urge or mixed incontinence.)    they are having urinary leakage (1-2 times weekly, 3 or more times weekly, 1-2 times daily and 3 or more times daily) They are using absorbent products for leakage (no, sometimes, always )   the type of products they use are (panty liners, absorbant pads, depends) *** daily.  They are not limiting fluids.  They are not engaging in toilet mapping  ***  UA ***  Serum creatinine (01/2024) 1.02, eGFR 57   Hemoglobin A1c (01/2024) 6.2   OAB agent: Gemtesa 75 mg daily   PMH: Past Medical History:  Diagnosis Date   DDD (degenerative disc disease), lumbar    Disorder of bone and cartilage, unspecified    Dysuria    Esophageal reflux    Lumbago    Osteoarthrosis, unspecified whether generalized or localized, unspecified site    Scoliosis    Ulcerative colitis, unspecified    Unspecified essential hypertension     Surgical History: Past Surgical History:  Procedure Laterality Date   CATARACT EXTRACTION, BILATERAL     08/2019 and  09/2019   CESAREAN SECTION     x2   CHOLECYSTECTOMY     LUMBAR DISC SURGERY  2008    Home Medications:  Allergies as of 06/08/2024       Reactions   Amoxicillin  Diarrhea   Prednisone  Other (See Comments)   Sx's of heart attack - elevated BP, HR and chest tightness/pressure.   Bactrim  Ds [sulfamethoxazole -trimethoprim ] Other (See Comments)   Rash,itching,whelps, throat swelling and feels like lump in throat.   Codeine    REACTION: stomach upset   Erythromycin    REACTION: stomach upset   Fosamax  [alendronate ] Other (See Comments)   Throat felt raw.   Sulfamethizole Hives        Medication List        Accurate as of June 06, 2024 12:44 PM. If you have any questions, ask your nurse or doctor.          BIOTIN PO Take 1 capsule by mouth daily.   Calcium  1200-1000 MG-UNIT Chew Chew 1 tablet by mouth daily.   cetirizine 10 MG tablet Commonly known as: ZYRTEC Take 10 mg by mouth daily.   Cranberry-Vitamin C-Vitamin E 4200-20-3 MG-MG-UNIT Caps Take by mouth. 1 daily   D-MANNOSE PO Take 1,000 mg by mouth daily.   diclofenac sodium 1 % Gel Commonly known as: VOLTAREN Apply topically as needed.   esomeprazole  40 MG capsule Commonly known as: NexIUM  TAKE ONE CAPSULE BY  MOUTH EVERY DAY - TAKE AT LEAST 1 HOUR BEFORE A MEAL   fluticasone  50 MCG/ACT nasal spray Commonly known as: FLONASE  2 sprays each nostril as directed.   hydrocortisone  2.5 % cream Apply 1 Application topically 2 (two) times daily.   hydrOXYzine  10 MG tablet Commonly known as: ATARAX  Take 1 tablet (10 mg total) by mouth 3 (three) times daily as needed.   lidocaine  5 % Commonly known as: Lidoderm  Place 2 patches onto the skin daily. Remove & Discard patch within 12 hours or as directed by MD   meloxicam  15 MG tablet Commonly known as: MOBIC  Take 1 tablet (15 mg total) by mouth daily. with food   mesalamine  1000 MG suppository Commonly known as: CANASA  PLACE 1 SUPPOSITORY (1,000 MG  TOTAL) RECTALLY AT BEDTIME AS NEEDED.   metoprolol  tartrate 25 MG tablet Commonly known as: LOPRESSOR  Take 1 tablet (25 mg total) by mouth 2 (two) times daily.   MULTIVITAMIN PO Take 1 tablet by mouth daily.   pregabalin  75 MG capsule Commonly known as: LYRICA  Take 1 capsule (75 mg total) by mouth at bedtime.   Premarin  vaginal cream Generic drug: conjugated estrogens  Use a pea sized amount on the urethra nightly for 30 days, then on Monday, Wednesday and Friday nights   PROBIOTIC DAILY PO Take by mouth.   rosuvastatin  5 MG tablet Commonly known as: Crestor  Take 1 tablet (5 mg total) by mouth daily.   traMADol  50 MG tablet Commonly known as: ULTRAM  Take 1 tablet (50 mg total) by mouth 2 (two) times daily as needed.        Allergies:  Allergies  Allergen Reactions   Amoxicillin  Diarrhea   Prednisone  Other (See Comments)    Sx's of heart attack - elevated BP, HR and chest tightness/pressure.   Bactrim  Ds [Sulfamethoxazole -Trimethoprim ] Other (See Comments)    Rash,itching,whelps, throat swelling and feels like lump in throat.   Codeine     REACTION: stomach upset   Erythromycin     REACTION: stomach upset   Fosamax  [Alendronate ] Other (See Comments)    Throat felt raw.   Sulfamethizole Hives    Family History: Family History  Problem Relation Age of Onset   Heart attack Father 92   Diabetes Father    Lung cancer Mother     Social History:  reports that she quit smoking about 43 years ago. Her smoking use included cigarettes. She started smoking about 56 years ago. She has a 13 pack-year smoking history. She has never used smokeless tobacco. She reports that she does not currently use alcohol. She reports that she does not use drugs.  ROS: Pertinent ROS in HPI  Physical Exam: There were no vitals taken for this visit.  Constitutional:  Well nourished. Alert and oriented, No acute distress. HEENT: [place] AT, moist mucus membranes.  Trachea midline, no  masses. Cardiovascular: No clubbing, cyanosis, or edema. Respiratory: Normal respiratory effort, no increased work of breathing. GU: No CVA tenderness.  No bladder fullness or masses.  Recession of labia minora, dry, pale vulvar vaginal mucosa and loss of mucosal ridges and folds.  Normal urethral meatus, no lesions, no prolapse, no discharge.   No urethral masses, tenderness and/or tenderness. No bladder fullness, tenderness or masses. *** vagina mucosa, *** estrogen effect, no discharge, no lesions, *** pelvic support, *** cystocele and *** rectocele noted.  No cervical motion tenderness.  Uterus is freely mobile and non-fixed.  No adnexal/parametria masses or tenderness noted.  Anus and perineum are  without rashes or lesions.   ***  Neurologic: Grossly intact, no focal deficits, moving all 4 extremities. Psychiatric: Normal mood and affect.    Laboratory Data: See Epic and HPI I have reviewed the labs.   Pertinent Imaging: N/A  Assessment & Plan:    1. Urinary frequency (Primary) - ***   2. GSM  - ***  No follow-ups on file.  These notes generated with voice recognition software. I apologize for typographical errors.  CLOTILDA HELON RIGGERS  [facility] [address]  [address] [place], [place] [address] [phone]

## 2024-06-08 ENCOUNTER — Ambulatory Visit (INDEPENDENT_AMBULATORY_CARE_PROVIDER_SITE_OTHER): Admitting: Urology

## 2024-06-08 ENCOUNTER — Encounter: Payer: Self-pay | Admitting: Urology

## 2024-06-08 VITALS — BP 148/94 | HR 65 | Ht 63.5 in | Wt 178.0 lb

## 2024-06-08 DIAGNOSIS — R350 Frequency of micturition: Secondary | ICD-10-CM

## 2024-06-08 DIAGNOSIS — N952 Postmenopausal atrophic vaginitis: Secondary | ICD-10-CM

## 2024-06-08 DIAGNOSIS — N958 Other specified menopausal and perimenopausal disorders: Secondary | ICD-10-CM | POA: Diagnosis not present

## 2024-06-08 DIAGNOSIS — R300 Dysuria: Secondary | ICD-10-CM | POA: Diagnosis not present

## 2024-06-08 LAB — URINALYSIS, COMPLETE
Bilirubin, UA: NEGATIVE
Glucose, UA: NEGATIVE
Ketones, UA: NEGATIVE
Leukocytes,UA: NEGATIVE
Nitrite, UA: NEGATIVE
Protein,UA: NEGATIVE
RBC, UA: NEGATIVE
Specific Gravity, UA: 1.01 (ref 1.005–1.030)
Urobilinogen, Ur: 0.2 mg/dL (ref 0.2–1.0)
pH, UA: 6 (ref 5.0–7.5)

## 2024-06-08 LAB — MICROSCOPIC EXAMINATION: RBC, Urine: NONE SEEN /HPF (ref 0–2)

## 2024-06-08 LAB — BLADDER SCAN AMB NON-IMAGING

## 2024-06-08 MED ORDER — PREMARIN 0.625 MG/GM VA CREA: TOPICAL_CREAM | VAGINAL | 12 refills | Status: DC

## 2024-06-12 ENCOUNTER — Encounter: Payer: Self-pay | Admitting: Nurse Practitioner

## 2024-06-12 DIAGNOSIS — M545 Low back pain, unspecified: Secondary | ICD-10-CM

## 2024-06-13 MED ORDER — TRAMADOL HCL 50 MG PO TABS
50.0000 mg | ORAL_TABLET | Freq: Two times a day (BID) | ORAL | 0 refills | Status: AC | PRN
Start: 2024-06-13 — End: ?

## 2024-06-20 ENCOUNTER — Other Ambulatory Visit: Payer: Self-pay | Admitting: Urology

## 2024-06-20 DIAGNOSIS — N390 Urinary tract infection, site not specified: Secondary | ICD-10-CM

## 2024-06-20 MED ORDER — NITROFURANTOIN MONOHYD MACRO 100 MG PO CAPS: 100.0000 mg | ORAL_CAPSULE | Freq: Two times a day (BID) | ORAL | 0 refills | Status: DC

## 2024-08-04 ENCOUNTER — Ambulatory Visit: Payer: Self-pay | Admitting: Urology

## 2024-08-06 NOTE — Progress Notes (Unsigned)
 "    08/07/2024 5:58 PM   Brittney Pierce [birth year] [id number]  Referring provider: Wendee Lynwood HERO, NP [address] [place],  [place] [address]  Urological history: 1. rUTI's - RUS (03/2024) simple right renal cyst  - May 04, 2024 - E.coli - April 23, 2024 - E.coli -January 05, 2024, E. coli -August 25, 2023, E.coli  -July 19, 2023, E. coli and mixed urogenital flora -July 05, 2023, E.coli -June 17, 2023, E. coli -June 08, 2023, E. Coli -Vaginal estrogen cream, cranberry tablets and d-mannose   No chief complaint on file.  HPI: Brittney Pierce is a 74 y.o. woman who presents today to recheck possible UTI that was treated with Macrobid  empirically as she was not able to come to the office during the Holidays.    Previous records reviewed.  UA ***  Serum creatinine (01/2024) 1.02, eGFR 57   Hemoglobin A1c (01/2024) 6.2    PMH: Past Medical History:  Diagnosis Date   DDD (degenerative disc disease), lumbar    Disorder of bone and cartilage, unspecified    Dysuria    Esophageal reflux    Lumbago    Osteoarthrosis, unspecified whether generalized or localized, unspecified site    Scoliosis    Ulcerative colitis, unspecified    Unspecified essential hypertension     Surgical History: Past Surgical History:  Procedure Laterality Date   CATARACT EXTRACTION, BILATERAL     08/2019 and 09/2019   CESAREAN SECTION     x2   CHOLECYSTECTOMY     LUMBAR DISC SURGERY  2008    Home Medications:  Allergies as of 08/07/2024       Reactions   Amoxicillin  Diarrhea   Prednisone  Other (See Comments)   Sx's of heart attack - elevated BP, HR and chest tightness/pressure.   Bactrim  Ds [sulfamethoxazole -trimethoprim ] Other (See Comments)   Rash,itching,whelps, throat swelling and feels like lump in throat.   Codeine    REACTION: stomach upset   Erythromycin    REACTION: stomach upset   Fosamax  [alendronate ] Other (See Comments)    Throat felt raw.   Sulfamethizole Hives        Medication List        Accurate as of August 06, 2024  5:58 PM. If you have any questions, ask your nurse or doctor.          BIOTIN PO Take 1 capsule by mouth daily.   Calcium  1200-1000 MG-UNIT Chew Chew 1 tablet by mouth daily.   cetirizine 10 MG tablet Commonly known as: ZYRTEC Take 10 mg by mouth daily.   Cranberry-Vitamin C-Vitamin E 4200-20-3 MG-MG-UNIT Caps Take by mouth. 1 daily   D-MANNOSE PO Take 1,000 mg by mouth daily.   diclofenac sodium 1 % Gel Commonly known as: VOLTAREN Apply topically as needed.   esomeprazole  40 MG capsule Commonly known as: NexIUM  TAKE ONE CAPSULE BY MOUTH EVERY DAY - TAKE AT LEAST 1 HOUR BEFORE A MEAL   fluticasone  50 MCG/ACT nasal spray Commonly known as: FLONASE  2 sprays each nostril as directed.   hydrocortisone  2.5 % cream Apply 1 Application topically 2 (two) times daily.   hydrOXYzine  10 MG tablet Commonly known as: ATARAX  Take 1 tablet (10 mg total) by mouth 3 (three) times daily as needed.   lidocaine  5 % Commonly known as: Lidoderm  Place 2 patches onto the skin daily. Remove & Discard patch within 12 hours or as directed by MD   meloxicam  15 MG tablet Commonly  known as: MOBIC  Take 1 tablet (15 mg total) by mouth daily. with food   mesalamine  1000 MG suppository Commonly known as: CANASA  PLACE 1 SUPPOSITORY (1,000 MG TOTAL) RECTALLY AT BEDTIME AS NEEDED.   metoprolol  tartrate 25 MG tablet Commonly known as: LOPRESSOR  Take 1 tablet (25 mg total) by mouth 2 (two) times daily.   MULTIVITAMIN PO Take 1 tablet by mouth daily.   nitrofurantoin  (macrocrystal-monohydrate) 100 MG capsule Commonly known as: MACROBID  Take 1 capsule (100 mg total) by mouth every 12 (twelve) hours.   pregabalin  75 MG capsule Commonly known as: LYRICA  Take 1 capsule (75 mg total) by mouth at bedtime.   Premarin  vaginal cream Generic drug: conjugated estrogens  Use a pea sized  amount on the urethra nightly for 30 days, then on Monday, Wednesday and Friday nights   PROBIOTIC DAILY PO Take by mouth.   rosuvastatin  5 MG tablet Commonly known as: Crestor  Take 1 tablet (5 mg total) by mouth daily.   traMADol  50 MG tablet Commonly known as: ULTRAM  Take 1 tablet (50 mg total) by mouth 2 (two) times daily as needed.        Allergies:  Allergies  Allergen Reactions   Amoxicillin  Diarrhea   Prednisone  Other (See Comments)    Sx's of heart attack - elevated BP, HR and chest tightness/pressure.   Bactrim  Ds [Sulfamethoxazole -Trimethoprim ] Other (See Comments)    Rash,itching,whelps, throat swelling and feels like lump in throat.   Codeine     REACTION: stomach upset   Erythromycin     REACTION: stomach upset   Fosamax  [Alendronate ] Other (See Comments)    Throat felt raw.   Sulfamethizole Hives    Family History: Family History  Problem Relation Age of Onset   Heart attack Father 34   Diabetes Father    Lung cancer Mother     Social History:  reports that she quit smoking about 44 years ago. Her smoking use included cigarettes. She started smoking about 57 years ago. She has a 13 pack-year smoking history. She has never used smokeless tobacco. She reports that she does not currently use alcohol. She reports that she does not use drugs.  ROS: Pertinent ROS in HPI  Physical Exam: There were no vitals taken for this visit.  Constitutional:  Well nourished. Alert and oriented, No acute distress. HEENT: [place] AT, moist mucus membranes.  Trachea midline, no masses. Cardiovascular: No clubbing, cyanosis, or edema. Respiratory: Normal respiratory effort, no increased work of breathing. GU: No CVA tenderness.  No bladder fullness or masses.  Recession of labia minora, dry, pale vulvar vaginal mucosa and loss of mucosal ridges and folds.  Normal urethral meatus, no lesions, no prolapse, no discharge.   No urethral masses, tenderness and/or tenderness. No  bladder fullness, tenderness or masses. *** vagina mucosa, *** estrogen effect, no discharge, no lesions, *** pelvic support, *** cystocele and *** rectocele noted.  No cervical motion tenderness.  Uterus is freely mobile and non-fixed.  No adnexal/parametria masses or tenderness noted.  Anus and perineum are without rashes or lesions.   ***  Neurologic: Grossly intact, no focal deficits, moving all 4 extremities. Psychiatric: Normal mood and affect.    Laboratory Data: See Epic and HPI I have reviewed the labs.   Pertinent Imaging: N/A   Assessment & Plan:    1. Presumed UTI - UA ***   2. GSM  - Continue vaginal estrogen cream 3 nights weekly   No follow-ups on file.  These notes generated  with voice recognition software. I apologize for typographical errors.  CLOTILDA HELON RIGGERS  [facility] [address]  [address] [place], [place] [address] [phone]  "

## 2024-08-07 ENCOUNTER — Encounter: Payer: Self-pay | Admitting: Urology

## 2024-08-07 ENCOUNTER — Ambulatory Visit: Admitting: Urology

## 2024-08-07 VITALS — BP 122/86 | HR 76 | Wt 178.0 lb

## 2024-08-07 DIAGNOSIS — N952 Postmenopausal atrophic vaginitis: Secondary | ICD-10-CM | POA: Diagnosis not present

## 2024-08-07 DIAGNOSIS — N390 Urinary tract infection, site not specified: Secondary | ICD-10-CM

## 2024-08-07 DIAGNOSIS — N958 Other specified menopausal and perimenopausal disorders: Secondary | ICD-10-CM | POA: Diagnosis not present

## 2024-08-07 LAB — MICROSCOPIC EXAMINATION: WBC, UA: >30 /HPF — AB (ref 0–5)

## 2024-08-07 LAB — URINALYSIS, COMPLETE
Bilirubin, UA: NEGATIVE
Glucose, UA: NEGATIVE
Nitrite, UA: POSITIVE — AB
Protein,UA: NEGATIVE
Specific Gravity, UA: 1.025 (ref 1.005–1.030)
Urobilinogen, Ur: 0.2 mg/dL (ref 0.2–1.0)
pH, UA: 6 (ref 5.0–7.5)

## 2024-08-07 MED ORDER — NITROFURANTOIN MONOHYD MACRO 100 MG PO CAPS: 100.0000 mg | ORAL_CAPSULE | Freq: Two times a day (BID) | ORAL | 0 refills | Status: DC

## 2024-08-07 MED ORDER — PREMARIN 0.625 MG/GM VA CREA: TOPICAL_CREAM | VAGINAL | 12 refills | Status: DC

## 2024-08-10 LAB — CULTURE, URINE COMPREHENSIVE

## 2024-08-11 ENCOUNTER — Ambulatory Visit: Payer: Self-pay | Admitting: Urology

## 2024-08-11 DIAGNOSIS — N390 Urinary tract infection, site not specified: Secondary | ICD-10-CM

## 2024-08-11 MED ORDER — CIPROFLOXACIN HCL 250 MG PO TABS: 250.0000 mg | ORAL_TABLET | Freq: Two times a day (BID) | ORAL | 0 refills | Status: AC

## 2024-08-11 MED ORDER — CEPHALEXIN 250 MG PO CAPS: 250.0000 mg | ORAL_CAPSULE | Freq: Every day | ORAL | 2 refills | Status: AC

## 2024-08-16 ENCOUNTER — Other Ambulatory Visit: Payer: Self-pay | Admitting: Urology

## 2024-08-16 DIAGNOSIS — N952 Postmenopausal atrophic vaginitis: Secondary | ICD-10-CM

## 2024-08-16 NOTE — Telephone Encounter (Signed)
 Per last ov patient is to use estrogen cream 3 nights weekly

## 2024-08-21 ENCOUNTER — Ambulatory Visit: Admitting: Nurse Practitioner

## 2024-08-28 ENCOUNTER — Ambulatory Visit: Admitting: Nurse Practitioner

## 2024-10-06 ENCOUNTER — Ambulatory Visit: Admitting: Nurse Practitioner

## 2024-11-06 ENCOUNTER — Ambulatory Visit: Admitting: Urology

## 2025-04-26 ENCOUNTER — Ambulatory Visit

## 2025-04-27 ENCOUNTER — Ambulatory Visit

## 2025-06-11 ENCOUNTER — Ambulatory Visit: Admitting: Urology
